# Patient Record
Sex: Female | Born: 1945 | Race: Black or African American | Hispanic: No | Marital: Married | Smoking: Former smoker
Health system: Southern US, Community
[De-identification: ages and names within clinical notes are randomized; demographics above are authoritative.]

## PROBLEM LIST (undated history)

## (undated) DIAGNOSIS — J45990 Exercise induced bronchospasm: Secondary | ICD-10-CM

## (undated) DIAGNOSIS — J189 Pneumonia, unspecified organism: Secondary | ICD-10-CM

## (undated) DIAGNOSIS — I2510 Atherosclerotic heart disease of native coronary artery without angina pectoris: Secondary | ICD-10-CM

## (undated) DIAGNOSIS — I82409 Acute embolism and thrombosis of unspecified deep veins of unspecified lower extremity: Secondary | ICD-10-CM

## (undated) DIAGNOSIS — K219 Gastro-esophageal reflux disease without esophagitis: Secondary | ICD-10-CM

## (undated) DIAGNOSIS — R51 Headache: Secondary | ICD-10-CM

## (undated) DIAGNOSIS — I219 Acute myocardial infarction, unspecified: Secondary | ICD-10-CM

## (undated) DIAGNOSIS — I5032 Chronic diastolic (congestive) heart failure: Secondary | ICD-10-CM

## (undated) DIAGNOSIS — E119 Type 2 diabetes mellitus without complications: Secondary | ICD-10-CM

## (undated) DIAGNOSIS — M1990 Unspecified osteoarthritis, unspecified site: Secondary | ICD-10-CM

## (undated) DIAGNOSIS — I639 Cerebral infarction, unspecified: Secondary | ICD-10-CM

## (undated) HISTORY — DX: Chronic diastolic (congestive) heart failure: I50.32

## (undated) HISTORY — PX: DILATION AND CURETTAGE OF UTERUS: SHX78

## (undated) HISTORY — PX: CATARACT EXTRACTION EXTRACAPSULAR: SHX1305

## (undated) HISTORY — PX: TUBAL LIGATION: SHX77

---

## 1997-04-23 ENCOUNTER — Ambulatory Visit (HOSPITAL_COMMUNITY): Admission: RE | Admit: 1997-04-23 | Discharge: 1997-04-23 | Payer: Self-pay | Admitting: Internal Medicine

## 1997-07-13 ENCOUNTER — Ambulatory Visit (HOSPITAL_COMMUNITY): Admission: RE | Admit: 1997-07-13 | Discharge: 1997-07-13 | Payer: Self-pay | Admitting: Internal Medicine

## 1997-11-02 ENCOUNTER — Ambulatory Visit (HOSPITAL_COMMUNITY): Admission: RE | Admit: 1997-11-02 | Discharge: 1997-11-02 | Payer: Self-pay | Admitting: Internal Medicine

## 1997-11-02 ENCOUNTER — Encounter: Payer: Self-pay | Admitting: Internal Medicine

## 1999-10-14 ENCOUNTER — Other Ambulatory Visit: Admission: RE | Admit: 1999-10-14 | Discharge: 1999-10-14 | Payer: Self-pay | Admitting: Internal Medicine

## 1999-10-16 ENCOUNTER — Encounter: Payer: Self-pay | Admitting: Internal Medicine

## 1999-10-16 ENCOUNTER — Encounter: Admission: RE | Admit: 1999-10-16 | Discharge: 1999-10-16 | Payer: Self-pay | Admitting: Internal Medicine

## 1999-12-24 ENCOUNTER — Encounter: Admission: RE | Admit: 1999-12-24 | Discharge: 1999-12-24 | Payer: Self-pay | Admitting: Orthopedic Surgery

## 1999-12-24 ENCOUNTER — Encounter: Payer: Self-pay | Admitting: Orthopedic Surgery

## 2000-02-24 ENCOUNTER — Encounter: Payer: Self-pay | Admitting: Internal Medicine

## 2000-02-25 ENCOUNTER — Inpatient Hospital Stay (HOSPITAL_COMMUNITY): Admission: AD | Admit: 2000-02-25 | Discharge: 2000-02-28 | Payer: Self-pay | Admitting: Internal Medicine

## 2000-07-28 ENCOUNTER — Emergency Department (HOSPITAL_COMMUNITY): Admission: EM | Admit: 2000-07-28 | Discharge: 2000-07-28 | Payer: Self-pay | Admitting: Plastic Surgery

## 2001-07-17 ENCOUNTER — Encounter: Payer: Self-pay | Admitting: Emergency Medicine

## 2001-07-17 ENCOUNTER — Inpatient Hospital Stay (HOSPITAL_COMMUNITY): Admission: EM | Admit: 2001-07-17 | Discharge: 2001-07-22 | Payer: Self-pay | Admitting: Emergency Medicine

## 2001-07-18 ENCOUNTER — Encounter (INDEPENDENT_AMBULATORY_CARE_PROVIDER_SITE_OTHER): Payer: Self-pay | Admitting: Cardiology

## 2001-07-19 ENCOUNTER — Encounter: Payer: Self-pay | Admitting: Internal Medicine

## 2001-07-21 ENCOUNTER — Encounter: Payer: Self-pay | Admitting: Internal Medicine

## 2001-07-21 ENCOUNTER — Encounter (INDEPENDENT_AMBULATORY_CARE_PROVIDER_SITE_OTHER): Payer: Self-pay | Admitting: Cardiovascular Disease

## 2001-07-22 ENCOUNTER — Inpatient Hospital Stay (HOSPITAL_COMMUNITY)
Admission: RE | Admit: 2001-07-22 | Discharge: 2001-08-10 | Payer: Self-pay | Admitting: Physical Medicine & Rehabilitation

## 2001-07-29 ENCOUNTER — Encounter: Payer: Self-pay | Admitting: Physical Medicine & Rehabilitation

## 2001-08-04 ENCOUNTER — Encounter: Payer: Self-pay | Admitting: Physical Medicine & Rehabilitation

## 2001-09-28 ENCOUNTER — Emergency Department (HOSPITAL_COMMUNITY): Admission: EM | Admit: 2001-09-28 | Discharge: 2001-09-28 | Payer: Self-pay

## 2001-10-18 ENCOUNTER — Encounter
Admission: RE | Admit: 2001-10-18 | Discharge: 2001-11-13 | Payer: Self-pay | Admitting: Physical Medicine & Rehabilitation

## 2001-11-14 ENCOUNTER — Encounter
Admission: RE | Admit: 2001-11-14 | Discharge: 2002-02-12 | Payer: Self-pay | Admitting: Physical Medicine & Rehabilitation

## 2002-02-13 ENCOUNTER — Encounter
Admission: RE | Admit: 2002-02-13 | Discharge: 2002-05-14 | Payer: Self-pay | Admitting: Physical Medicine & Rehabilitation

## 2002-05-04 ENCOUNTER — Encounter
Admission: RE | Admit: 2002-05-04 | Discharge: 2002-08-02 | Payer: Self-pay | Admitting: Physical Medicine & Rehabilitation

## 2002-05-15 ENCOUNTER — Encounter
Admission: RE | Admit: 2002-05-15 | Discharge: 2002-06-12 | Payer: Self-pay | Admitting: Physical Medicine & Rehabilitation

## 2002-08-29 ENCOUNTER — Encounter
Admission: RE | Admit: 2002-08-29 | Discharge: 2002-11-27 | Payer: Self-pay | Admitting: Physical Medicine & Rehabilitation

## 2002-09-05 ENCOUNTER — Encounter: Payer: Self-pay | Admitting: Ophthalmology

## 2002-09-05 ENCOUNTER — Encounter: Admission: RE | Admit: 2002-09-05 | Discharge: 2002-09-05 | Payer: Self-pay | Admitting: Ophthalmology

## 2002-09-06 ENCOUNTER — Ambulatory Visit (HOSPITAL_BASED_OUTPATIENT_CLINIC_OR_DEPARTMENT_OTHER): Admission: RE | Admit: 2002-09-06 | Discharge: 2002-09-06 | Payer: Self-pay | Admitting: Ophthalmology

## 2003-01-06 DIAGNOSIS — I639 Cerebral infarction, unspecified: Secondary | ICD-10-CM

## 2003-01-06 HISTORY — DX: Cerebral infarction, unspecified: I63.9

## 2003-05-29 ENCOUNTER — Encounter
Admission: RE | Admit: 2003-05-29 | Discharge: 2003-08-27 | Payer: Self-pay | Admitting: Physical Medicine & Rehabilitation

## 2005-10-28 ENCOUNTER — Inpatient Hospital Stay (HOSPITAL_COMMUNITY): Admission: EM | Admit: 2005-10-28 | Discharge: 2005-11-15 | Payer: Self-pay | Admitting: Emergency Medicine

## 2005-11-09 ENCOUNTER — Encounter: Payer: Self-pay | Admitting: Vascular Surgery

## 2005-11-16 ENCOUNTER — Ambulatory Visit (HOSPITAL_COMMUNITY): Admission: RE | Admit: 2005-11-16 | Discharge: 2005-11-16 | Payer: Self-pay | Admitting: Internal Medicine

## 2005-11-23 ENCOUNTER — Ambulatory Visit (HOSPITAL_COMMUNITY): Admission: RE | Admit: 2005-11-23 | Discharge: 2005-11-23 | Payer: Self-pay | Admitting: Internal Medicine

## 2005-11-30 ENCOUNTER — Ambulatory Visit (HOSPITAL_COMMUNITY): Admission: RE | Admit: 2005-11-30 | Discharge: 2005-11-30 | Payer: Self-pay

## 2006-03-22 ENCOUNTER — Emergency Department (HOSPITAL_COMMUNITY): Admission: EM | Admit: 2006-03-22 | Discharge: 2006-03-22 | Payer: Self-pay | Admitting: Emergency Medicine

## 2007-03-01 ENCOUNTER — Emergency Department (HOSPITAL_COMMUNITY): Admission: EM | Admit: 2007-03-01 | Discharge: 2007-03-01 | Payer: Self-pay | Admitting: Emergency Medicine

## 2007-11-22 ENCOUNTER — Ambulatory Visit (HOSPITAL_COMMUNITY): Admission: RE | Admit: 2007-11-22 | Discharge: 2007-11-22 | Payer: Self-pay | Admitting: Ophthalmology

## 2007-12-05 ENCOUNTER — Ambulatory Visit (HOSPITAL_COMMUNITY): Admission: RE | Admit: 2007-12-05 | Discharge: 2007-12-05 | Payer: Self-pay | Admitting: Ophthalmology

## 2008-04-03 ENCOUNTER — Emergency Department (HOSPITAL_COMMUNITY): Admission: EM | Admit: 2008-04-03 | Discharge: 2008-04-03 | Payer: Self-pay | Admitting: Emergency Medicine

## 2008-04-06 ENCOUNTER — Emergency Department (HOSPITAL_COMMUNITY): Admission: EM | Admit: 2008-04-06 | Discharge: 2008-04-07 | Payer: Self-pay | Admitting: Emergency Medicine

## 2008-06-17 ENCOUNTER — Observation Stay (HOSPITAL_COMMUNITY): Admission: EM | Admit: 2008-06-17 | Discharge: 2008-06-17 | Payer: Self-pay | Admitting: Emergency Medicine

## 2009-06-18 ENCOUNTER — Encounter: Admission: RE | Admit: 2009-06-18 | Discharge: 2009-06-18 | Payer: Self-pay | Admitting: Internal Medicine

## 2010-04-14 LAB — COMPREHENSIVE METABOLIC PANEL
ALT: 9 U/L (ref 0–35)
Alkaline Phosphatase: 90 U/L (ref 39–117)
BUN: 13 mg/dL (ref 6–23)
CO2: 21 mEq/L (ref 19–32)
Chloride: 110 mEq/L (ref 96–112)
GFR calc non Af Amer: 40 mL/min — ABNORMAL LOW (ref >60)
Glucose, Bld: 90 mg/dL (ref 70–99)
Potassium: 3.4 mEq/L — ABNORMAL LOW (ref 3.5–5.1)
Sodium: 139 mEq/L (ref 135–145)
Total Bilirubin: 0.4 mg/dL (ref 0.3–1.2)

## 2010-04-14 LAB — DIFFERENTIAL
Basophils Absolute: 0 10*3/uL (ref 0.0–0.1)
Basophils Relative: 0 % (ref 0–1)
Eosinophils Absolute: 0 10*3/uL (ref 0.0–0.7)
Eosinophils Relative: 0 % (ref 0–5)
Lymphocytes Relative: 21 % (ref 12–46)
Lymphs Abs: 1.7 K/uL (ref 0.7–4.0)
Monocytes Absolute: 0.5 K/uL (ref 0.1–1.0)
Monocytes Relative: 6 % (ref 3–12)
Neutro Abs: 5.6 10*3/uL (ref 1.7–7.7)
Neutrophils Relative %: 72 % (ref 43–77)

## 2010-04-14 LAB — URINALYSIS, ROUTINE W REFLEX MICROSCOPIC
Bilirubin Urine: NEGATIVE
Glucose, UA: NEGATIVE mg/dL
Hgb urine dipstick: NEGATIVE
Ketones, ur: 15 mg/dL — AB
Leukocytes, UA: NEGATIVE
Nitrite: NEGATIVE
Protein, ur: 30 mg/dL — AB
Specific Gravity, Urine: 1.022 (ref 1.005–1.030)
Urobilinogen, UA: 0.2 mg/dL (ref 0.0–1.0)
pH: 5.5 (ref 5.0–8.0)

## 2010-04-14 LAB — POCT CARDIAC MARKERS
CKMB, poc: <1 ng/mL — ABNORMAL LOW (ref 1.0–8.0)
Myoglobin, poc: 74.4 ng/mL (ref 12–200)
Troponin i, poc: <0.05 ng/mL (ref 0.00–0.09)

## 2010-04-14 LAB — CBC
HCT: 33.7 % — ABNORMAL LOW (ref 36.0–46.0)
Hemoglobin: 11 g/dL — ABNORMAL LOW (ref 12.0–15.0)
MCHC: 32.7 g/dL (ref 30.0–36.0)
MCV: 89 fL (ref 78.0–100.0)
Platelets: 462 K/uL — ABNORMAL HIGH (ref 150–400)
RBC: 3.78 MIL/uL — ABNORMAL LOW (ref 3.87–5.11)
RDW: 17.1 % — ABNORMAL HIGH (ref 11.5–15.5)
WBC: 7.8 K/uL (ref 4.0–10.5)

## 2010-04-14 LAB — COMPREHENSIVE METABOLIC PANEL WITH GFR
AST: 16 U/L (ref 0–37)
Albumin: 3.2 g/dL — ABNORMAL LOW (ref 3.5–5.2)
Calcium: 7.5 mg/dL — ABNORMAL LOW (ref 8.4–10.5)
Creatinine, Ser: 1.34 mg/dL — ABNORMAL HIGH (ref 0.4–1.2)
GFR calc Af Amer: 48 mL/min — ABNORMAL LOW (ref >60)
Total Protein: 6.9 g/dL (ref 6.0–8.3)

## 2010-04-14 LAB — GLUCOSE, CAPILLARY
Glucose-Capillary: 89 mg/dL (ref 70–99)
Glucose-Capillary: 97 mg/dL (ref 70–99)

## 2010-04-14 LAB — LIPASE, BLOOD: Lipase: 17 U/L (ref 11–59)

## 2010-04-14 LAB — URINE MICROSCOPIC-ADD ON

## 2010-04-16 LAB — CBC
HCT: 32.9 % — ABNORMAL LOW (ref 36.0–46.0)
Platelets: 344 10*3/uL (ref 150–400)
RBC: 3.73 MIL/uL — ABNORMAL LOW (ref 3.87–5.11)
WBC: 5.4 10*3/uL (ref 4.0–10.5)

## 2010-04-16 LAB — BASIC METABOLIC PANEL
BUN: 18 mg/dL (ref 6–23)
Creatinine, Ser: 1.35 mg/dL — ABNORMAL HIGH (ref 0.4–1.2)
GFR calc Af Amer: 48 mL/min — ABNORMAL LOW (ref >60)
GFR calc non Af Amer: 40 mL/min — ABNORMAL LOW (ref >60)
Potassium: 3.8 mEq/L (ref 3.5–5.1)

## 2010-04-16 LAB — DIFFERENTIAL
Basophils Relative: 1 % (ref 0–1)
Eosinophils Absolute: 0.1 10*3/uL (ref 0.0–0.7)
Neutrophils Relative %: 61 % (ref 43–77)

## 2010-04-16 LAB — CK TOTAL AND CKMB (NOT AT ARMC)
CK, MB: 0.9 ng/mL (ref 0.3–4.0)
Relative Index: INVALID (ref 0.0–2.5)

## 2010-04-16 LAB — TROPONIN I: Troponin I: <0.01 ng/mL (ref 0.00–0.06)

## 2010-04-17 LAB — DIFFERENTIAL
Basophils Absolute: 0.1 10*3/uL (ref 0.0–0.1)
Eosinophils Relative: 1 % (ref 0–5)
Lymphocytes Relative: 33 % (ref 12–46)
Monocytes Absolute: 0.5 10*3/uL (ref 0.1–1.0)

## 2010-04-17 LAB — COMPREHENSIVE METABOLIC PANEL
AST: 18 U/L (ref 0–37)
Albumin: 3.6 g/dL (ref 3.5–5.2)
Alkaline Phosphatase: 66 U/L (ref 39–117)
Chloride: 111 mEq/L (ref 96–112)
Creatinine, Ser: 1.4 mg/dL — ABNORMAL HIGH (ref 0.4–1.2)
GFR calc Af Amer: 46 mL/min — ABNORMAL LOW (ref >60)
Potassium: 3.9 mEq/L (ref 3.5–5.1)
Sodium: 141 mEq/L (ref 135–145)
Total Bilirubin: 0.4 mg/dL (ref 0.3–1.2)

## 2010-04-17 LAB — POCT CARDIAC MARKERS: Troponin i, poc: <0.05 ng/mL (ref 0.00–0.09)

## 2010-04-17 LAB — URINALYSIS, ROUTINE W REFLEX MICROSCOPIC
Leukocytes, UA: NEGATIVE
Nitrite: NEGATIVE
Specific Gravity, Urine: 1.027 (ref 1.005–1.030)
pH: 5.5 (ref 5.0–8.0)

## 2010-04-17 LAB — POCT I-STAT, CHEM 8
BUN: 22 mg/dL (ref 6–23)
Calcium, Ion: 1.1 mmol/L — ABNORMAL LOW (ref 1.12–1.32)
Chloride: 112 mEq/L (ref 96–112)
Creatinine, Ser: 1.4 mg/dL — ABNORMAL HIGH (ref 0.4–1.2)
Glucose, Bld: 128 mg/dL — ABNORMAL HIGH (ref 70–99)

## 2010-04-17 LAB — URINE MICROSCOPIC-ADD ON

## 2010-04-17 LAB — GLUCOSE, CAPILLARY

## 2010-04-17 LAB — APTT: aPTT: 38 seconds — ABNORMAL HIGH (ref 24–37)

## 2010-04-17 LAB — CBC
Platelets: 362 10*3/uL (ref 150–400)
WBC: 10.4 10*3/uL (ref 4.0–10.5)

## 2010-05-20 NOTE — Op Note (Signed)
NAME:  [name] [name]                   ACCOUNT NO.:  [id number]   MEDICAL RECORD NO.:  [id number]          PATIENT TYPE:  AMB   LOCATION:  SDS                          FACILITY:  [facility]   PHYSICIAN:  Jillyn Hidden A. Rankin, M.D.   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   PROCEDURE:  1. Posterior vitrectomy - 25-gauge, left eye.  2. Exchange of intraocular lens with extraction of the lens from the      bag and placement of a sulcus-supported lens.  3. 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   SURGEON:  Alford Highland. Rankin, MD   ANESTHESIA:  Local retrobulbar, monitored anesthesia control.   INDICATIONS FOR PROCEDURE:  The patient is a 65 year old woman with  profound vision loss in her left eye on the basis of media opacity  coming from apparent substrate of the intraocular lens developing  cloudiness, crystallization, and dense opacification which limits  visualization posteriorly.  The patient understands this is an attempt  to improve the ocular media opacity by removal of the intraocular lens  and placement of a secondary intraocular lens.  She understands the risk  of anesthesia including rare occurrence of death, loss of the eye  including but not limited to surgical complications including vitreous  loss, loss of capsular support, need for anterior chamber intraocular  lens placement, decentration of the lens.  She understands the risk of  anesthesia as well.  She understands these risks and wishes to  proceed.   PROCEDURE IN DETAIL:  Appropriate signed consent was obtained.  The  patient was taken to the operating room.  In the operating room,  appropriate monitors followed by mild sedation.  Xylocaine 2% injected 5  mL retrobulbar with additional 5 mL laterally in fashion of modified Darel Hong.  The left periocular region was then sterilely prepped and draped  in the usual sterile fashion.  Lid speculum was applied.  A 25-gauge  trocar for the infusion placed inferotemporally and the infusion turned  on.  Superior trocars placed as well in each quadrant.  Conjunctival  peritomy was then fashioned in a limited fashion superiorly.  A groove  level incision was then fashioned.  The anterior chamber was not opened  at this time.  The anterior chamber was subsequently opened with an MVR  blade and deepened with Viscoat.  Attempt at vitrectomy confirmed that  the media opacification was in the intraocular lens because the tips  could not be seen and there was a diffuse opacity and diffraction of the  view as well as of the intraocular light source.  At this time, plugs were placed on the 25-gauge ports.  Thereafter,  across the anterior chamber deepened with Viscoat, surgical openings  with a curved 20-gauge DORC scissors were then made to make an anterior  capsulotomy since there was a tight capsular fitting.  This allowed for  stretching of the anterior capsular rim.  This allowed for mobilization  of the intraocular lens.  Sinskey hook was then used to mobilize the  edge of the optic and then subsequently the haptic nasally and this was  allowed to rotate gently and into the iris plane.  Thereafter, a second  paracentesis site was then made so as to allow placement of a Kuglen  hook.  This allowed for bimanual rotation of the lens out of the  capsular bag and into the anterior chamber.  The cornea was protected by  Viscoat.  At this time, the limbal incision was then enlarged with an   MVR blade.  The intraocular lens was externalized.  At this time, also  noted that the iris was extremely floppy.  The posterior pressure with  an infusion cannula had been turned off well before any surgical  manipulations anteriorly.  Nonetheless, the floppy iris prolapsed from  the wound.  This required a small keyhole iridectomy to be performed so  as to complete the procedure.  Thereafter, the Alcon model [id number] lens  was then placed into the sulcus after the sulcus had been deepened with  Viscoat.  The power was +17.0.   The lens was rotated in the horizontal position with excellent  centration characteristics.  It must be noted that posterior vitrectomy  was done on repeated occasions to keep the pressure on the iris-lens  diaphragm low and to prevent and minimize posterior pressure proceeding  anteriorly.  At this time, the limbal wound was then closed with a 10-0  nylon in interrupted fashion.  Small amounts of the Viscoat were  irrigated prior to completion of the closure with BSS.  Excellent  centration of the lens was confirmed.  At this time, the remaining  corneal limbal wound was closed with 10-0 nylon sutures.  Attention was  then drawn to vitrectomy.  A notable finding was that now the optic  nerve and macula could be clearly seen.  The vitreous had not  spontaneously detached.  Core vitrectomy was then completed.  No attempt  was made to release the posterior hyaloid because there appeared to be  some sort of flat nonetheless dense condensation of the vitreous face  overlying the optic nerve and I decided not to do anything to disturb  the microvasculature of an otherwise normal, healthy optic nerve  appearance.  Macula also appeared normal.  The vasculature appeared  normal.  Peripheral retina was inspected and was found to be free of  retinal holes or tears.  At this time, the surgical instruments were  removed from the eye.  Trocars were removed.  The wounds were  secure.  The infusion was removed.  The conjunctiva was irrigated copiously.  Conjunctiva was then brought forward and closed with 7-0 Vicryl sutures.  The wounds were secure.  Subconjunctival Decadron applied.  Sterile  patch and Fox shield applied.  The patient tolerated the procedure  without complication.      Alford Highland Rankin, M.D.  Electronically Signed     GAR/MEDQ  D:  12/05/2007  T:  12/06/2007  Job:  [id number]
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
## 2010-05-23 NOTE — H&P (Signed)
[facility]  Patient:    [name], [name]                          MRN: [id number] Adm. Date:  [id number] Attending:  Gwenyth Bender                         History and Physical  CHIEF COMPLAINT:  Progressive weakness, nausea and vomiting, with presyncope.  HISTORY OF PRESENT ILLNESS:  First recent [facility] admission for this 65 year old married black female who presented to the office complaining of a five-day history of increasing weakness, cough, fever, and chills.  She had been exposed to several family members who had "the flu."  She noted increasing sinus congestion with headache with subsequent postnasal drainage and cough.  She was given Zithromax for this.  She, however, continued to have progressive problems with cough and increasing weakness as well.  Four days prior to admission she had a syncopal episode at home which was unwitnessed. She was found unconscious.  She, however, was not taken to the emergency room for evaluation.  Since that time, she has not felt well.  Appetite has been poor.  She has been unable to drink liquids.  She notes she did have an episode of transient diarrhea which was mostly soft stool.  There has been no hematemesis, melena, or hematochezia.  The day of admission she developed a severe explosive headache.  This was associated with unsteadiness to her gait. Had ongoing nausea and vomiting.  Patient was seen in the office and was subsequently transferred for admission.  PAST MEDICAL HISTORY:  Remarkable for recurring problems with sinusitis and allergic rhinitis in the past.  She has had intermittent headaches for several years.  Presently, she notes that her headaches are on a daily basis.  They tend to be throbbing in nature without associated nausea.  Denies any precipitation of headaches with stress.  Patient has had sporadic office visits.  She has not had comprehensive evaluation in an extended period  of time.  She has longstanding history of depression as well.  REVIEW OF SYSTEMS:  Notable for recent problems with constipation.  She notes that she remains chronically constipated.  Last "good bowel movement" has been approximately two weeks ago.  She denies dysuria.  Denies significant leg pains.  Weakness as noted above.  HABITS:  Patient smokes approximately two packs of cigarettes a day for the past 26 years.  Does not drink.  PREVIOUS SURGERIES:  Tonsillectomy and tubal ligation.  ALLERGIES:  PENICILLIN.  PRESENT MEDICATIONS: 1. Allegra 180 mg p.o. q.d. 2. Premarin 0.3 mg q.d. 3. Motrin 800 mg t.i.d. 4. Norvasc 5 mg p.o. q.d.  PHYSICAL EXAMINATION:  GENERAL:  She is an ill-appearing black female.  VITAL SIGNS:  Height 5 feet 5 inches, weight 151 pounds.  Blood pressure 154/85, pulse 80, respiratory rate 20, temperature 98.3 (99.9 in the office).  HEENT:  Head normocephalic, atraumatic without bruits.  Extraocular muscles are intact.  Mild dizziness on the right lateral gaze.  She has tenderness in the frontal and left maxillary sinus region.  Nose shows bilateral turbinate edema without occlusions.  TMs notable with decreased light reflex without erythematous changes.  Throat shows membranes are dry.  NECK:  Supple.  No posterior cervical nodes.  LUNGS:  Coarse breath sounds bilaterally without wheezes or rales appreciated.  CARDIOVASCULAR:  Normal S1, S2,  no S3 or S4.  She has a 1/6 systolic ejection murmur in her left lower sternal border.  No rub appreciated.  ABDOMEN:  Bowel sounds are present.  Abdomen is distended.  She has dullness diffusely.  No rebound tenderness.  MUSCULOSKELETAL:  No tenderness in AC joints bilaterally.  Full passive range of motion.  She has mild crepitus in the knees bilaterally.  Negative Homans, no edema.  NEUROLOGIC:  She is slightly lethargic but oriented to person, place, and time.  Complained of extreme weakness.  Grasp was  symmetric and negative drift.  Absent Babinskis bilaterally.  LABORATORY DATA:  CBC revealed WBC 5500, hemoglobin 11.9, hematocrit 35.9. Platelets 316,000.  Chemistry revealed sodium 140, potassium 3.5, chloride 106, CO2 26, BUN 8, creatinine 0.9, glucose 121.  SGOT 19, SGPT 11.  Alkaline phosphatase 72.  Total protein 6.9, albumin 3.4.  Calcium 8.9.  Urinalysis dipstick negative, pH 6.5, specific gravity 1.007.  Abdominal film showed evidence for colon being full of feces.  No other obstruction noted.  IMPRESSION:  1. Recurrent nausea and vomiting.  Rule out secondary to post viral     syndrome, i.e. influenza versus colonic dysfunction versus other.  2. Dehydration associated with nausea and vomiting.  3. Chronic colonic dysfunction with recent exacerbation.  4. Viral syndrome.  5. Rule out occult sinusitis, partially treated.  She had been given a     course of antibiotics.  6. Chronic history of headaches.  Rule out mixed vascular headaches versus     migraines.  7. Depression.  8. Anxiety disorder, chronic.  9. Tobacco abuse. 10. Incomplete medical base.  Patient has not returned for regular     comprehensive exams.  PLAN:  She is admitted for initial 23-hour observation.  Will place on IV fluids for rehydration.  Place her on Tamiflu for influenza-like symptoms. Control pain as necessary.  Will need CT scan of the head as well.  Reevaluate after hydration.  Will need to plan on catharsis as well.  Further therapy pending response to the above. DD:  02/25/00 TD:  02/26/00 Job: [id number]

## 2010-05-23 NOTE — Discharge Summary (Signed)
NAME:  [name], [name]                             ACCOUNT NO.:  [id number]   MEDICAL RECORD NO.:  [id number]                   PATIENT TYPE:  IPS   LOCATION:  4033                                 FACILITY:  [facility]   PHYSICIAN:  Ranelle Oyster, M.D.             DATE OF BIRTH:  07-16-1945   DATE OF ADMISSION:  07/22/2001  DATE OF DISCHARGE:  08/10/2001                                 DISCHARGE SUMMARY   DISCHARGE DIAGNOSES:  1. Mid-brain cerebrovascular accident.  2. History of hypertension.  3. History of cholesterolemia.  4. Anorexia.  5. History of diabetes mellitus.   HISTORY OF PRESENT ILLNESS:  Patient is a 65 year old black female with past  history of tobacco abuse and hypertension admitted on July 17, 2001, at  [facility] ER after found unresponsive at home by family.  Positive for  headache.  MRI revealed arterial infarction located in the mid-brain.  Dopplers revealed no evidence of internal carotid artery stenosis.  Patient  it presently on aspirin for CVA prophylaxis.  PT at this time indicates  patient is ambulating with mild assist 12 feet and transfers to sit down  without assist.   HOSPITAL COURSE:  Significant for a UTI and constipation.  Patient was  transferred to the [facility] on July 22, 2001.   PAST MEDICAL HISTORY:  Significant for hypertension, diabetes mellitus,  tobacco abuse, and depression.   PAST SURGICAL HISTORY:  Significant for cardiac surgery and tubal ligation.   MEDS PRIOR TO ADMISSION:  Actos 200 mg q.d., Accupril 20 mg q.d., Elavil 10  mg q.d.   ALLERGIES:  Denied.   SOCIAL HISTORY:  Patient lives alone in a one-level home with family in  [place].  One step to enter.  She smokes 1 pack of cigarettes a day.  Prior to admission, she denies any alcohol.  She has 4 children.  She works  third shift.  Husband is retired.   REVIEW OF SYSTEMS:  Positive for headaches and dizziness.  Primary care  Karlina Suares is Dr.  August Saucer.   HOSPITAL COURSE:  [name] was admitted to the [facility]  Department on July 22, 2001, for complications and patient rehabilitation,  where she received more than 3 hours of PT and OT therapy daily.  Her  hospital course was significant for the following:   1. Mid-brain CVA:  [name] made very slow progress while in rehab and at     the time of discharge did not meet goals.  During most of her stay, the     patient was occasionally lethargic, and a flat affect, and refused     medications at times.  It was noted that she participated better with     therapy with her family, and therefore she received an early discharge.     She remained on aspirin 325 mg p.o. q.d.  for CVA prophylaxis.  Patient     was started on a psychostimulant, Ritalin, as well as bromocriptine.     Doses were adjusted accordingly.  Bromocriptine was dc 'd on August 09, 2001.  The patient did develop some mild tachycardia, therefore, Ritalin     dose was decreased from 15 mg to 10 mg at 7 a.m. and noon.  On August 04, 2001, patient was noted to be less responsive and decreased alertness.     Therefore, a head CT was ordered on August 04, 2001.  Head CT revealed     probable bilateral small thalamic, likely neural infarct, question     infarct versus artifact in brain.  Due to tachycardia noted on August 09, 2001, the patient had EKG, which revealed sinus tachycardia.  The patient     remained on asymptomatic and heart rate did improve.  Patient had no more     neurological changes.  The patient was started on Paxil 20 mg p.o. q.d.     on July 29, 2001.  This was later discontinued on August 02, 2001.   1. History of hypercholesterolemia:  She remained on Zocor 40 mg p.o. q.     h.s.  During the time spent in rehab, liver function was within normal     limits.  F/E/N:  The patient had a poor appetite, mainly during rehab.     At time of admission, she was on a __________ 1 diet with  nectar-thick     liquids.  She tolerates speech very well.  Patient had a repeat swallow     study performed on August 09, 2001, which upgraded her diet to __________     1 and thin liquids.  The patient was encouraged to increase p.o. intake.     She was started on Remeron on 15 mg q.h.s. on July 24, 2001.   1. F/ENN:  Patient's pre-albumin level was 24.1.  Patient n.p.o. intake did     improve slightly.   1. Constipation:  On first week of rehab, patient was complaining about     constipation, unable to perform a bowel movement.  KUB was ordered on     July 29, 2001, to rule-out obstruction.  KUB came out negative.  Patient     received sorbitol and enema for constipation.  Constipation did resolve.   1. UTI:  Patient received a 7-day course of Tequin for urinary tract     infection.   1. History of diabetes mellitus:  CBGs remained in good control without     medication.  With her decreased p.o. intake, she remained on an 1800 ADA     __________ 1 diet.  CBGs were checked twice daily rotating.   1. History of hypertension:  Blood pressure remained on great control while     in rehab.  She was presently on no antihypertensives.   LABORATORY:  Latest labs indicate that her potassium was 4.0, sodium 100, glucose 111,  AST was 20, ALT was 17, alkaline phosphatase was 72, BUN was 17, creatinine  was 0.9.  White blood cell count was 6.1.  Hemoglobin 14.4.  Hematocrit  44.4.  Platelet count 338.   At time of discharge, vitals were stable.  Blood pressure was 122/60.  Respiratory rate was 26.  Heart rate was 126.  Temperature was 95.3.  CBGs  were running from 126 to 124 to 109  and 93.  PT report indicated patient was  ambulating 30 feet with moderate to maximum assist, secondary to loss of  balance and unable to correct.  She was moving a wheelchair over 25 feet  with minimum assistance.  Her transfer to sit and stand minimum assistance. Bed mobility with supervision.  Performance of  ADLs minimum assist,  supervision level.  PT reported the patient was very lethargic throughout  CIR stay.  Performs better when family is present.  Length of stay was moved  up secondary to lethargy and decreased participation.  She had an extensive  family education completed primarily with patient's spouse, who performed 24-  hour supervision.  He demonstrated independent assistance with all of  patient's ADLs.  She continued to have bowel __________  and trunk ataxia,  and she also has visual-perceptual defects with left eye 90% ptosis.  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  Also still with more like a severe  dysarthria, which makes it difficult to assess language issues.  She appears  to have an element of aphasia, receptive and expressive.  Patient did not  meet all OT goals secondary to several things, including lethargy and  decreased participation.  Patient's spouse will provide 24-hour assistance  with ADLs at home.  Patient was discharged home with her husband.   DISCHARGE MEDICATIONS:  Aspirin 325 mg daily, Zocor 40 mg in afternoon,  Remeron 15 mg at night, Ritalin 5 mg while taper, multivitamin and Centrum  Silver daily.  Take all meds on the sheet.  Take Actos, Elavil, and Accupril  until follow up with Dr. August Saucer.  No drinking, no driving, no smoking.  Use a  walker.  Use a wheelchair.  Eat 100% of meals.  Soft diet, thin liquids.  Check sugars at least 2 times a week, and record results and times.  She  will have to taper home health for speech OT and PT.  She will follow up  with Dr. Faith Rogue October 1 at 10 o'clock, follow up with Dr. Lesia Sago within 4 weeks.  Call for appointment.  Follow up with Dr. August Saucer in 6  weeks, and monitor sugars and blood pressures.     Treasa Bradshaw Dictator                          Ranelle Oyster, M.D.    DD/MEDQ  D:  08/10/2001  T:  08/15/2001  Job:   [id number]

## 2010-05-23 NOTE — Consult Note (Signed)
[place]  Patient:    [name], [name] Visit Number: [id number] MRN: [id number]          Service Type: [facility] Location: [phone] Attending Physician:  Faith Rogue T Dictated by:   Annie Main, N.P. Proc. Date: 07/22/01 Admit Date:  07/22/2001                            Consultation Report  HISTORY OF PRESENT ILLNESS AND HOSPITAL COURSE:  The patient is a 65 year old right-handed black female with a past medical history significant for hypertension, type 2 diabetes, depression and tobacco abuse who the family found unresponsive in the bed.  According to her husband, she complained of headache and tingling of the lips when she went to bed the night before.  At admission the patient was obtunded and would not open her eyes or respond to commands.  She would not speak.  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  MRA did not show any occlusive disease.  Did show somewhat narrowing of left A1 at origin in both ACAs.  Carotid Dopplers were normal, 2-D echo showed no cardiac source and TEE also was unrevealing for cardiac source.  Stroke was felt to be embolic in nature but no source found.  IV heparin was discontinued and the patient was started on aspirin therapy for stroke prevention.  She was transferred to rehab at discharge for continued therapies.  CONDITION AT DISCHARGE:  The patient alert.  Moves all extremities x4.  Has bilateral ataxia with extremities and finger-nose-finger.  Extraocular movements were intact to the right eye only with left eye gaze paresis. Short-term memory was about 40 seconds.  Could not remember three objects after that time frame.  Did have a UTI and was started on Tequin for that.  FOLLOWUP:  Followup planned in office 1 month after discharge from rehab. Dictated by:   Annie Main,  N.P. Attending Physician:  Faith Rogue T DD:  07/22/01 TD:  07/26/01 Job: [id number]
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
## 2010-05-23 NOTE — Discharge Summary (Signed)
[place]  Patient:    [name], [name]                          MRN: [id number] Adm. Date:  [id number] Disc. Date: [id number] Attending:  Gwenyth Bender                           Discharge Summary  FINAL DIAGNOSES: 1. Mild infection, unspecified (079.99) 2. Hypovolemia (276.5). 3. 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 4. Depressive disorder (311.0). 5. Headache (784.0) 6. 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 Tobacco use disorder (305.1)  PROCEDURES:  None.  HISTORY OF PRESENT ILLNESS:  This is the first recent [facility] admission for this 65 year old married black female who presented to the office complaining of a 5 day history of increasing weakness, cough, fever, and chills.  The patient had been exposed to several family members who had "the flu".  She noted an increase in sinus congestion with headache, with subsequent post nasal drainage and cough.  The patient was given Zithromax, however, she continued to have progressive problems with cough and increasing weakness.  Four days prior to admission she had a syncopal episode at home which was unwitnessed.  She was found unconscious.  She, however, was not taken to the emergency room for evaluation.  Since that time she has not felt well.   her appetite has been poor.  The patient had been unable to drink liquids or solids.  She has had 1 brief episode of diarrhea.  No hematemesis, melena or hematochezia.  On the day of admission she developed a severe explosive headache.  The patient was subsequently admitted to the hospital after presenting to the office in acute distress.  PAST MEDICAL HISTORY AND PHYSICAL EXAMINATION:  Per admission history and physical.  HOSPITAL COURSE:  The patient was admitted for further evaluation and treatment of recurring nausea and vomiting with dehydration and colonic dysfunction.  It was felt that she did indeed have a viral syndrome ______ flu by  history.  As she was dehydrated she was placed on IV fluids with gradual rehydration of a subsequent 23 hour period.  The patient was also started on Tamiflu for her influenza like symptoms. After 24 hours the patient felt somewhat better.  She continued however, to have episodic nausea and vomiting.  On abdominal examination she was noted to have marked distention with dullness in all quadrants.  X-rays of her abdomen were obtained which demonstrated evidence for feces throughout the colon with no obstruction.  She subsequently underwent significant catharsis.  She was continued on IV fluids for hydration.  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  The next day the patient felt considerably better.  Her headaches were decreasing.  After catharsis she had no further nausea and vomiting.  Appetite gradually returned.  She was continued on hydration as there was still some evidence of mild volume depletion.  Eventually by February 27, 2000, she was feeling much better.  Her headaches had resolved completely.  She was sleeping much better.  Appetite was improved.  It was noted during her hospital stay that she had mild hyperglycemia.  A hemoglobin A1C was obtained which was at 6.8.  The patient was counseled regarding avoidance of ______ fruits and sweets in the diet.  She does have a family  history of diabetes.  By March 27, 2000, she was feeling considerably better and felt to be safe for discharge.  DISCHARGE MEDICATIONS: 1. Claritin 10 mg p.o. q.d. 2. Paxil 20 mg q.d. 3. Tranxene 7.5 mg t.i.d. p.r.n. 4. Elavil 10 mg q.h.s. 5. Colace 100 mg t.i.d. 6. Miralax 17 grams in ounces of water q.d. for constipation.  DIET:  No concentrated sweets in her diet.  Plenty of fruits and vegetables have been encouraged.  FOLLOWUP:  She is to call our office for an appointment in 2 weeks  time. DD:  04/07/00 TD:  04/08/00 Job: [id number]

## 2010-05-23 NOTE — Op Note (Signed)
NAME:  [name], [name]                             ACCOUNT NO.:  [id number]   MEDICAL RECORD NO.:  [id number]                   PATIENT TYPE:  AMB   LOCATION:  DSC                                  FACILITY:  [facility]   PHYSICIAN:  Michael A. Karleen Hampshire, M.D.            DATE OF BIRTH:  December 28, 1945   DATE OF PROCEDURE:  09/06/2002  DATE OF DISCHARGE:                                 OPERATIVE REPORT   PREOPERATIVE DIAGNOSES:  1. Exotropia with diplopia.  2. 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   POSTOPERATIVE DIAGNOSES:  1. Exotropia with diplopia.  2. 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   SURGEON:  Tyrone Apple. Karleen Hampshire, M.D.   ANESTHESIA:  General with endotracheal intubation.   PROCEDURES:  Left medial rectus resection of 5 mm, a left lateral rectus  recession of 8 mm, and a right lateral rectus recession of 5 mm on  adjustable sutures.   INDICATION FOR PROCEDURE:  [name] is a 65 year old black female who is  status post a stroke resulting in a left third nerve paresis with a left  hemiplegia and diplopia.  This procedure is indicated to restore single  binocular vision and restore alignment of the visual axis.  The risks and  benefits of the procedure were explained to the patient and the patient's  family prior to the procedure.  Informed consent was obtained.   TECHNIQUE:  The patient was taken to the operating room and placed in the  supine position.  The entire face was prepped and draped in the usual  sterile manner.  Our attention was first turned to the left eye.  The bulb  was held in the inferior nasal quadrant and the eye was elevated and  abducted.  The incision was made through the inferior nasal fornix, taken  down to the posterior sub-Tenon's space, and the left medial rectus muscle  was then isolated on a Stevens hook, subsequently on a Green hook.  A second  Green hook was then passed near the tendon of the muscle and the  muscle was  then carefully dissected free from its overlying muscle fascia and  intramuscular septum.  The tendon was then placed upon a second Green hook  and a mark was then placed on the tendon at 5 mm from its insertion.  The  tendon was then imbricated at the preplaced mark using 6-0 Vicryl suture and  taking two locking bites of the ends of the muscle.  The muscle was then  transected proximal to the preplaced sutures and the transected muscle was  advanced through the insertion site using the preplaced sutures and  reattached to the globe.  The conjunctiva was then repositioned.  Our  attention was then turned to the right eye.  Forced duction tests were  performed and found to be negative.  The globe was held  in the inferior  temporal quadrant.  It was elevated and adducted.  An incision was made  through the inferior temporal fornix.  It was taken down to the posterior  sub-Tenon's space and the right lateral rectus was isolated on a Stevens  hook, subsequently on a Green hook.  A second Green hook was passed beneath  the tendon, and the tendon was then carefully dissected free from its  overlying muscle fascia and intramuscular septum.  It was then imbricated on  a 6-0 Vicryl suture, taking two locking bites at the ends, detached from the  globe, and recessed exactly 5 mm, reattached to the globe using the  preplaced sutures in adjustable suture fashion.  The adjustable sutures were  then deposited underneath the conjunctiva for later adjustment.  Our  attention was then turned to the left lateral rectus.  The globe was held in  the inferior temporal quadrant of the left eye and the eye was then elevated  and adducted.  An incision was made from the inferior temporal fornix of the  left eye and taken down to the posterior sub-Tenon's space and the left  lateral rectus muscle was then isolated on a Stevens hook, subsequently on a  Green hook.  An identical recession of the left  lateral rectus muscle was  performed of 8 mm using the technique outlined above for the right lateral  rectus recession.  At the conclusion of the procedure antibiotic ointment  was instilled in the inferior fornices of the left eye.  A double pressure  patch was applied to the right eye, and the patient was subsequently  adjusted to orthophoria in the minor surgery room.  There were no  complications.                                                 Casimiro Needle A. Karleen Hampshire, M.D.    MAS/MEDQ  D:  09/06/2002  T:  09/07/2002  Job:  [id number]
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
## 2010-05-23 NOTE — Consult Note (Signed)
[place]  Patient:    CHRISTL, FESSENDEN Visit Number: [id number] MRN: [id number]          Service Type: MED Location: 3100 [id number] Attending Physician:  Gwenyth Bender Dictated by:   Deanna Artis. Sharene Skeans, M.D. Proc. Date: 07/17/01 Admit Date:  07/17/2001   CC:         Minerva Areola L. August Saucer, M.D.  Eduardo Osier. Sharyn Lull, M.D.   Consultation Report  NEUROLOGY CONSULTATION REPORT  DATE OF BIRTH:  1945-03-31  REASON FOR CONSULTATION:  I was asked by Dr. Sharyn Lull (covering for Dr. August Saucer) to see this patient in consultation at the [facility] emergency department for evaluation of unresponsiveness.  HISTORY OF PRESENT ILLNESS:  The patient went to bed last night with complaints of tingling of her lips.  She had not been ill during the day.  She had had no head injury and had not had problems with headache or difficulties with her blood sugar control.  This morning her husband tried to awaken her around 6 oclock, he was unable to do so.  She was brought to the [facility] emergency department where she was evaluated by Dr. Carleene Cooper, arriving here around 8:18.  The patient was evaluated and did not have evidence of hypoglycemia, severe hypertension and had no obvious fluid and electrolyte abnormalities nor signs of drug toxicity with the exception of urine drug screen positive for benzodiazepines (she takes a night time sleeping pill).  The CT scan of the brain was reported as showing a subtle medullary lesion.  It appears to me to be a possible lesion in the dorsal mid brain and two very tiny lesions in the thalami bilaterally.  I cannot tell if these are real, some form of volume averaging or artifact.  I was asked to see her to determine the etiology of her unresponsiveness and to make recommendations for further work-up and treatment.  PAST MEDICAL HISTORY:  Remarkable for non-insulin-dependent diabetes mellitus, hypertension, which  has been well controlled, history of depression.  She has also had long standing tobacco abuse.  PAST SURGICAL HISTORY:  The patient had a left iridectomy.  She has also had tubal ligation.  REVIEW OF SYSTEMS:  The patient has had no fever, chills, productive cough, respiratory distress, nausea, vomiting, diarrhea, bleeding dyscrasias, rash, joint or neck pain.  She has had no chest pain, no palpitations. She has had occasional headaches but did not complain of any recently.  She has not had any known problems with vision, hearing, chewing, swallowing, speech, strength or coordination of her limbs, syncope or seizures.  She has never had an episode like this previously.  CURRENT MEDICATIONS: 1. Ketoprofen 75 mg t.i.d. 2. Amitriptyline 10 mg per day. 3. Prilosec 20 mg per day. 4. Ambien 10 mg at bedtime. 5. Reglan 5 mg t.i.d. 6. Clorazepate 7.5 mg t.i.d. 7. Actos 30 mg per day. 8. Accupril 20 mg per day.  ALLERGIES TO MEDICATIONS:  None known.  FAMILY HISTORY:  Positive for cerebrovascular accident in her mother who also had a myocardial infarction.  History of atherosclerotic cardiovascular disease, hypertension.  SOCIAL HISTORY:  The patient is married and has four children.  She has smoked 1 pack of cigarettes per day for over 20 years.  She does not use alcohol. She works for [employer].  PHYSICAL EXAMINATION: GENERAL:  On examination today the patient is unresponsive lying on the stretcher.  She did not open  her eyes, respond to commands nor speak.  She was able to be aroused to localized noxious stimuli, see below.  VITAL SIGNS:  Blood pressure 125/69, resting pulse 66, respirations 20, temperature 98.  HEENT:  Ear, nose and throat supple.  NECK:  No bruits.  No signs of infection.  LUNGS:  Clear.  HEART:  No murmurs.  PULSES: Normal.  ABDOMEN:  Soft.  Bowel sounds normal.  EXTREMITIES:  No edema or cyanosis.  SKIN:  No lesions.  NEUROLOGICAL  EXAMINATION:  Mental status:  See above.  Cranial nerves:  Right pupil is sluggish, normal fundus.  Left pupil is irregular, does not respond. I cannot see the fundus.  I wonder if she has had a retinal detachment.  There is no dolls eye maneuver.  She has equal facial grimace.  She has positive and equal corneals, positive gag.  Motor examination:  Patient withdraws and localizes to pain.  She moves her upper extremities purposely in localizing pain and her lower extremities semi-purposely in withdrawing from pain.  She does have some fine motor movements in both hands.  Cerebellar examination and gait could not be tested.  Deep tendon reflexes are diminished and equal.  She had bilateral extensor plantar responses.  IMPRESSION:  Unresponsiveness as part of delirium 293.0.  Likely etiologies are dorsal mid brain infarction which would bother her reticular activating formation and cause obtundation and poor pupillary reaction without causing significant weakness.  The other possibility, given the medications that she takes, would include toxic delirium from her medications.  Seems unlikely that she has sepsis or meningitis as an etiology of her dysfunction.  PLAN:  Magnetic resonance imaging of brain, MRA intracranial this morning.  If we find evidence of a stroke she will need to have further work-up for ischemic stroke. If none is found, then we may need to consider lumbar puncture and electroencephalogram in the morning.  At present, the patient should receive no medications orally because she is unresponsive.  I appreciate the opportunity to see her and will continue to follow her along with you unless she has a stroke, in which case she will be placed on our stroke consultation service.  SUMMARY OF LABORATORY:  I have reviewed her CT scan personally and as  mentioned in my interpretation above, I see no remote strokes and the lesions that I see are subtle at best.  White blood  cell count 7,800, hemoglobin 12.9, hematocrit 39.3, MCV 89.2, platelet count 204,000.  Neutrophils 38%, lymphocytes 49%, monocytes 10%, eosinophils 2%, basophils 1%.  Urinalysis: specific gravity 1.014, pH 6.0, leukocyte esterase and nitrites negative.  Urine toxicology screen positive for benzodiazepines, negative for others.  Prothrombin time 12.1, INR 0.8, PTT 29. I-STAT sodium 140, potassium 4.7, chloride 106, carbon dioxide 25, hemoglobin 15, hematocrit 44, glucose 112. PH 7.37, creatinine 1.3.  Repeat pH 7.34. PCO2 51.2, pO2 63 on room air.  Electrocardiogram normal sinus rhythm, nonspecific ST-T wave abnormalities. Dictated by:   Deanna Artis. Sharene Skeans, M.D. Attending Physician:  Gwenyth Bender DD:  07/17/01 TD:  07/19/01 Job: [id number]

## 2010-05-23 NOTE — Discharge Summary (Signed)
NAME:  [name], [name]                             ACCOUNT NO.:  [id number]   MEDICAL RECORD NO.:  [id number]                   PATIENT TYPE:  INP   LOCATION:  3107                                 FACILITY:  [facility]   PHYSICIAN:  [name] [name]. August Saucer, M.D.                  DATE OF BIRTH:  1945-10-21   DATE OF ADMISSION:  07/17/2001  DATE OF DISCHARGE:  07/22/2001                                 DISCHARGE SUMMARY   FINAL DIAGNOSES:  1. Cerebral arterial occlusion with cerebral infarction, 434.91.  2. Urinary tract infection, 599.0.  3. Pulmonary collapse, 598.0.  4. 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  5. 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  6. Hypertension, 401.9.  7. Diabetes mellitus type 2, non-insulin dependent, 250.00.  8. Depressive disorder, 311.  9. Tobacco use disorder, 305.1.  10.      Long-term use of anticoagulation, V58.61.   OPERATIONS/PROCEDURES:  None.   HISTORY OF PRESENT ILLNESS:  The patient is a 65 year old married black  female with a past history of hypertension, non-insulin dependent diabetes  mellitus, depression, and tobacco use who was brought to the emergency room  on July 17, 2001, by family after being found unresponsive.  The patient had  earlier complained of headache and tingling of the lips, but had otherwise  been doing well.  There as no associated nausea and vomiting, or blurred  vision during her initial presentation of symptoms.  She subsequently,  however, was later found to be unresponsive at home and was brought to the  emergency room for further evaluation.   PAST MEDICAL HISTORY:  As per admission H&P.   PHYSICAL EXAMINATION:  As per admission H&P.   HOSPITAL COURSE:  The patient was initially evaluated by Dr. Sharyn Lull.  It  was felt she had marked alteration in mental status.  A CT scan of the head  was obtained followed by MRI scanning.  This demonstrated an acute  infarction affecting the mid brain extending into both thalamic regions.  The patient was  admitted to the neurologic intensive care unit.  She was  placed on heparin and aspirin protocol.  She was followed closely initially  by Dr. Sharene Skeans as well as Dr. Anne Hahn.  The patient did also during this  time require sliding scale insulin for fluctuating blood sugars.   Over the subsequent days, she made very slow progress.  She remained  essentially somnolent for an extended period of time.  Further source of her  stroke was pursued.  Carotid Dopplers showed no significant internal carotid  artery stenosis.  She subsequently also underwent 2-D echo with TEE which  did not demonstrate any evidence for an embolic phenomenon.   Eventually, with continued supportive care, the patient made gradual  progress.  She became more alert to the point of being able to eat.  She was  evaluated by Speech Therapy with a modified  barium swallow done.  She had  some delay in phonation.  She was placed on a puree diet with nectar, thick  liquids.  She was seen by OT and PT. as well and was felt to be a candidate  for rehab and/or SACU. Admission.  After several days of intensive medical  management, the patient did become more alert.  On July 21, 2001, she spiked  a temperature to 102.  Blood and urine cultures were obtained.  She was  subsequently found to have a urinary tract infection for which she was  treated with Tequin.  She subsequently did well thereafter with her  temperature defervescing.  By July 22, 2001, she was felt to be stable for  transfer to the Rehab Unit for continuing care.   At the time of transfer she was more alert with vital signs stable.  Plans  were for more intensive OT and PT.   DISCHARGE MEDICATIONS:  1. Protonix 40 mg p.o. q.12h.  2. Tequin 400 mg p.o. q.d.  3. Aspirin 325 mg q.d.  4. Zocor 40 mg q.d.  5. Heparin per pharmacy protocol.  6. Sliding scale insulin as needed.  Scale at that time was for CBGs 201-250     two units of Novolin R, 251-300 four units,  301-350 six units, 351-400     eight units.   PROGNOSIS:  Remains guarded.                                                [name] [name]. August Saucer, M.D.    ELD/MEDQ  D:  08/31/2001  T:  09/03/2001  Job:  [id number]

## 2010-05-23 NOTE — Assessment & Plan Note (Signed)
[name] is back regarding her midbrain stroke.  She has had eye surgery by Dr.  Karleen Hampshire and had good results.  Apparently she needs cataract surgery to the  left side as well.  She is doing her basic ADLs at supervision level  currently.  She needs minimal assistance with shoes.  She walks with hand-  held assistance.  She does stagger on her own apparently at home for short  distances when husband is nearby.  She and husband deny any falls.  The  patient notes that her mood is good and she is sleeping well. Overall, she  has been steady from a neurological standpoint.   REVIEW OF SYMPTOMS:  The patient denies any chest pain, shortness of breath,  problems with numbness, spasms, or stroke symptoms.  Sleep has been fair.  No mood issues are noted.  The patient denies any nausea, vomiting, bowel or  bladder symptoms.  She denies fever, chills, weight loss or gains.  Sugars  have been well controlled.   PHYSICAL EXAMINATION:  VITAL SIGNS:  Blood pressure is 171/89, pulse 88,  respiratory rate 14, she is saturating 99% on room air.   The patient walks with a limp and ataxic gait, favoring the right side.  She  has some problems clearing the right leg.  The patient is alert and affect  is good.  She remains dysarthric with her speech but has good volume and is  generally intelligible.  Hand strength is 3+ to 4/5.  Still has some static  movements noted.  Right lower extremity strength and coordination is similar  at 3+ to 4/5.  She had a little better awareness of the right side than  previously.  The patient's gaze deficit is improved but still seems to have  some problems with conjugate gaze overall.  Right upper extremity is  nontender and tone was dystonic but not persistently increased.   ASSESSMENT:  1. 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  2. Bicipital tendinitis.  3. 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  4. Depression.   PLAN:  1. The patient is static  from a neurological standpoint at this point.  She     is doing well at a supervision level at home.  No further suggestions     from a rehab standpoint at this point.  2. From standpoint of her mood, she has been stable here as well.  I think     we can start to taper her Remeron off over the next several weeks' time.     I gave her a prescription 15 mg q.h.s. to begin once the current bottle     is empty and then she may stop.  If there are any problems, she is     welcome to call me.  The patient will follow up with Dr. August Saucer in about     three months' time for her regular medical checkup.  The patient will     remain on Effexor XR 75 mg daily.  3. The patient will continue on aspirin one daily for stroke prophylaxis.  4. I will see the patient back on a p.r.n. basis.      Ranelle Oyster, M.D.   ZTS/MedQ  D:  05/30/2003 12:04:27  T:  05/30/2003 13:13:06  Job #:  [id number]   cc:   Minerva Areola L. August Saucer, M.D.  [address] [address]  [place]  [place] [address]  Fax: [phone]

## 2010-06-01 ENCOUNTER — Emergency Department (HOSPITAL_COMMUNITY): Payer: Medicare Other

## 2010-06-01 ENCOUNTER — Emergency Department (HOSPITAL_COMMUNITY)
Admission: EM | Admit: 2010-06-01 | Discharge: 2010-06-01 | Disposition: A | Discharge status: Home / Self Care | Payer: Medicare Other | Attending: Emergency Medicine | Admitting: Emergency Medicine

## 2010-06-01 DIAGNOSIS — M25539 Pain in unspecified wrist: Secondary | ICD-10-CM | POA: Insufficient documentation

## 2010-06-01 DIAGNOSIS — M79609 Pain in unspecified limb: Secondary | ICD-10-CM | POA: Insufficient documentation

## 2010-06-01 DIAGNOSIS — S0003XA Contusion of scalp, initial encounter: Secondary | ICD-10-CM | POA: Insufficient documentation

## 2010-06-01 DIAGNOSIS — S1093XA Contusion of unspecified part of neck, initial encounter: Secondary | ICD-10-CM | POA: Insufficient documentation

## 2010-06-01 DIAGNOSIS — Z86718 Personal history of other venous thrombosis and embolism: Secondary | ICD-10-CM | POA: Insufficient documentation

## 2010-06-01 DIAGNOSIS — S60229A Contusion of unspecified hand, initial encounter: Secondary | ICD-10-CM | POA: Insufficient documentation

## 2010-06-01 DIAGNOSIS — E119 Type 2 diabetes mellitus without complications: Secondary | ICD-10-CM | POA: Insufficient documentation

## 2010-06-01 DIAGNOSIS — Z8679 Personal history of other diseases of the circulatory system: Secondary | ICD-10-CM | POA: Insufficient documentation

## 2010-06-01 DIAGNOSIS — W1809XA Striking against other object with subsequent fall, initial encounter: Secondary | ICD-10-CM | POA: Insufficient documentation

## 2010-06-01 DIAGNOSIS — Y92009 Unspecified place in unspecified non-institutional (private) residence as the place of occurrence of the external cause: Secondary | ICD-10-CM | POA: Insufficient documentation

## 2010-09-06 ENCOUNTER — Emergency Department (HOSPITAL_COMMUNITY): Payer: Medicare Other

## 2010-09-06 ENCOUNTER — Inpatient Hospital Stay (HOSPITAL_COMMUNITY)
Admission: EM | Admit: 2010-09-06 | Discharge: 2010-09-11 | DRG: 312 | Disposition: A | Discharge status: Home Health | Payer: Medicare Other | Attending: Internal Medicine | Admitting: Internal Medicine

## 2010-09-06 DIAGNOSIS — R55 Syncope and collapse: Principal | ICD-10-CM | POA: Diagnosis present

## 2010-09-06 DIAGNOSIS — N189 Chronic kidney disease, unspecified: Secondary | ICD-10-CM | POA: Diagnosis present

## 2010-09-06 DIAGNOSIS — W06XXXA Fall from bed, initial encounter: Secondary | ICD-10-CM | POA: Diagnosis present

## 2010-09-06 DIAGNOSIS — E538 Deficiency of other specified B group vitamins: Secondary | ICD-10-CM | POA: Diagnosis present

## 2010-09-06 DIAGNOSIS — I129 Hypertensive chronic kidney disease with stage 1 through stage 4 chronic kidney disease, or unspecified chronic kidney disease: Secondary | ICD-10-CM | POA: Diagnosis present

## 2010-09-06 DIAGNOSIS — I498 Other specified cardiac arrhythmias: Secondary | ICD-10-CM | POA: Diagnosis present

## 2010-09-06 DIAGNOSIS — D649 Anemia, unspecified: Secondary | ICD-10-CM | POA: Diagnosis present

## 2010-09-06 DIAGNOSIS — E669 Obesity, unspecified: Secondary | ICD-10-CM | POA: Diagnosis present

## 2010-09-06 DIAGNOSIS — E119 Type 2 diabetes mellitus without complications: Secondary | ICD-10-CM | POA: Diagnosis present

## 2010-09-06 DIAGNOSIS — Y92009 Unspecified place in unspecified non-institutional (private) residence as the place of occurrence of the external cause: Secondary | ICD-10-CM

## 2010-09-06 DIAGNOSIS — N179 Acute kidney failure, unspecified: Secondary | ICD-10-CM | POA: Diagnosis present

## 2010-09-06 DIAGNOSIS — E860 Dehydration: Secondary | ICD-10-CM | POA: Diagnosis present

## 2010-09-06 DIAGNOSIS — Z86718 Personal history of other venous thrombosis and embolism: Secondary | ICD-10-CM

## 2010-09-06 DIAGNOSIS — Z7901 Long term (current) use of anticoagulants: Secondary | ICD-10-CM

## 2010-09-06 DIAGNOSIS — I69959 Hemiplegia and hemiparesis following unspecified cerebrovascular disease affecting unspecified side: Secondary | ICD-10-CM

## 2010-09-06 DIAGNOSIS — I959 Hypotension, unspecified: Secondary | ICD-10-CM | POA: Diagnosis present

## 2010-09-06 LAB — COMPREHENSIVE METABOLIC PANEL
Albumin: 3.7 g/dL (ref 3.5–5.2)
BUN: 27 mg/dL — ABNORMAL HIGH (ref 6–23)
CO2: 22 mEq/L (ref 19–32)
Chloride: 101 mEq/L (ref 96–112)
Creatinine, Ser: 3.1 mg/dL — ABNORMAL HIGH (ref 0.50–1.10)
GFR calc Af Amer: 18 mL/min — ABNORMAL LOW (ref >60)
GFR calc non Af Amer: 15 mL/min — ABNORMAL LOW (ref >60)
Total Bilirubin: 0.1 mg/dL — ABNORMAL LOW (ref 0.3–1.2)

## 2010-09-06 LAB — CBC
Hemoglobin: 9.2 g/dL — ABNORMAL LOW (ref 12.0–15.0)
MCH: 25.3 pg — ABNORMAL LOW (ref 26.0–34.0)
MCHC: 31 g/dL (ref 30.0–36.0)

## 2010-09-06 LAB — DIFFERENTIAL
Basophils Relative: 0 % (ref 0–1)
Eosinophils Absolute: 0.1 10*3/uL (ref 0.0–0.7)
Monocytes Absolute: 0.8 10*3/uL (ref 0.1–1.0)
Monocytes Relative: 7 % (ref 3–12)
Neutro Abs: 6.5 10*3/uL (ref 1.7–7.7)

## 2010-09-06 LAB — POCT I-STAT TROPONIN I: Troponin i, poc: 0 ng/mL (ref 0.00–0.08)

## 2010-09-06 LAB — PROTIME-INR
INR: 5.26 (ref 0.00–1.49)
Prothrombin Time: 49 seconds — ABNORMAL HIGH (ref 11.6–15.2)

## 2010-09-07 LAB — LIPID PANEL: LDL Cholesterol: 45 mg/dL (ref 0–99)

## 2010-09-07 LAB — GLUCOSE, CAPILLARY
Glucose-Capillary: 106 mg/dL — ABNORMAL HIGH (ref 70–99)
Glucose-Capillary: 109 mg/dL — ABNORMAL HIGH (ref 70–99)

## 2010-09-07 LAB — CK TOTAL AND CKMB (NOT AT ARMC)
CK, MB: 1.8 ng/mL (ref 0.3–4.0)
Relative Index: INVALID (ref 0.0–2.5)
Total CK: 91 U/L (ref 7–177)

## 2010-09-07 LAB — TSH: TSH: 1.165 u[IU]/mL (ref 0.350–4.500)

## 2010-09-07 LAB — CARDIAC PANEL(CRET KIN+CKTOT+MB+TROPI): Relative Index: 1.9 (ref 0.0–2.5)

## 2010-09-07 LAB — TROPONIN I: Troponin I: <0.3 ng/mL (ref <0.30)

## 2010-09-08 ENCOUNTER — Inpatient Hospital Stay (HOSPITAL_COMMUNITY): Payer: Medicare Other

## 2010-09-08 LAB — PROTIME-INR
INR: 2.53 — ABNORMAL HIGH (ref 0.00–1.49)
Prothrombin Time: 27.7 seconds — ABNORMAL HIGH (ref 11.6–15.2)

## 2010-09-08 LAB — CBC
HCT: 27.4 % — ABNORMAL LOW (ref 36.0–46.0)
Hemoglobin: 8.6 g/dL — ABNORMAL LOW (ref 12.0–15.0)
MCHC: 31.4 g/dL (ref 30.0–36.0)
MCV: 81.8 fL (ref 78.0–100.0)
RDW: 16.3 % — ABNORMAL HIGH (ref 11.5–15.5)
WBC: 6.1 10*3/uL (ref 4.0–10.5)

## 2010-09-08 LAB — BASIC METABOLIC PANEL
BUN: 26 mg/dL — ABNORMAL HIGH (ref 6–23)
Chloride: 109 mEq/L (ref 96–112)
Creatinine, Ser: 2.01 mg/dL — ABNORMAL HIGH (ref 0.50–1.10)
GFR calc Af Amer: 30 mL/min — ABNORMAL LOW (ref >60)
Glucose, Bld: 96 mg/dL (ref 70–99)

## 2010-09-08 LAB — GLUCOSE, CAPILLARY
Glucose-Capillary: 91 mg/dL (ref 70–99)
Glucose-Capillary: 115 mg/dL — ABNORMAL HIGH (ref 70–99)
Glucose-Capillary: 96 mg/dL (ref 70–99)

## 2010-09-08 LAB — VITAMIN B12: Vitamin B-12: 152 pg/mL — ABNORMAL LOW (ref 211–911)

## 2010-09-08 LAB — FOLATE: Folate: 11.5 ng/mL

## 2010-09-08 LAB — FERRITIN: Ferritin: 13 ng/mL (ref 10–291)

## 2010-09-09 ENCOUNTER — Inpatient Hospital Stay (HOSPITAL_COMMUNITY): Payer: Medicare Other

## 2010-09-09 LAB — BASIC METABOLIC PANEL
BUN: 36 mg/dL — ABNORMAL HIGH (ref 6–23)
CO2: 23 mEq/L (ref 19–32)
Chloride: 108 mEq/L (ref 96–112)
Creatinine, Ser: 2.05 mg/dL — ABNORMAL HIGH (ref 0.50–1.10)
GFR calc Af Amer: 29 mL/min — ABNORMAL LOW (ref >60)
Potassium: 5 mEq/L (ref 3.5–5.1)

## 2010-09-09 LAB — GLUCOSE, CAPILLARY
Glucose-Capillary: 103 mg/dL — ABNORMAL HIGH (ref 70–99)
Glucose-Capillary: 105 mg/dL — ABNORMAL HIGH (ref 70–99)
Glucose-Capillary: 101 mg/dL — ABNORMAL HIGH (ref 70–99)

## 2010-09-09 LAB — CBC
HCT: 28 % — ABNORMAL LOW (ref 36.0–46.0)
Hemoglobin: 8.7 g/dL — ABNORMAL LOW (ref 12.0–15.0)
MCH: 25 pg — ABNORMAL LOW (ref 26.0–34.0)
MCHC: 31.1 g/dL (ref 30.0–36.0)
MCV: 80.5 fL (ref 78.0–100.0)
RBC: 3.48 MIL/uL — ABNORMAL LOW (ref 3.87–5.11)

## 2010-09-10 LAB — CBC
HCT: 30.9 % — ABNORMAL LOW (ref 36.0–46.0)
MCHC: 33 g/dL (ref 30.0–36.0)
RDW: 15.6 % — ABNORMAL HIGH (ref 11.5–15.5)
WBC: 7.6 10*3/uL (ref 4.0–10.5)

## 2010-09-10 LAB — COMPREHENSIVE METABOLIC PANEL
ALT: 7 U/L (ref 0–35)
AST: 14 U/L (ref 0–37)
Albumin: 3.2 g/dL — ABNORMAL LOW (ref 3.5–5.2)
Alkaline Phosphatase: 117 U/L (ref 39–117)
Chloride: 110 mEq/L (ref 96–112)
Potassium: 4.6 mEq/L (ref 3.5–5.1)
Sodium: 141 mEq/L (ref 135–145)
Total Bilirubin: 0.2 mg/dL — ABNORMAL LOW (ref 0.3–1.2)
Total Protein: 7.1 g/dL (ref 6.0–8.3)

## 2010-09-10 LAB — GLUCOSE, CAPILLARY
Glucose-Capillary: 92 mg/dL (ref 70–99)
Glucose-Capillary: 106 mg/dL — ABNORMAL HIGH (ref 70–99)

## 2010-09-10 LAB — TYPE AND SCREEN
ABO/RH(D): O POS
Antibody Screen: POSITIVE

## 2010-09-10 LAB — PROTIME-INR
INR: 1.72 — ABNORMAL HIGH (ref 0.00–1.49)
Prothrombin Time: 20.5 seconds — ABNORMAL HIGH (ref 11.6–15.2)

## 2010-09-10 LAB — PRO B NATRIURETIC PEPTIDE: Pro B Natriuretic peptide (BNP): 259 pg/mL — ABNORMAL HIGH (ref 0–125)

## 2010-09-11 LAB — CROSSMATCH
ABO/RH(D): O POS
Donor AG Type: NEGATIVE

## 2010-09-11 LAB — COMPREHENSIVE METABOLIC PANEL
ALT: 6 U/L (ref 0–35)
Albumin: 3.3 g/dL — ABNORMAL LOW (ref 3.5–5.2)
Alkaline Phosphatase: 118 U/L — ABNORMAL HIGH (ref 39–117)
Chloride: 109 mEq/L (ref 96–112)
Glucose, Bld: 96 mg/dL (ref 70–99)
Potassium: 4.5 mEq/L (ref 3.5–5.1)
Sodium: 140 mEq/L (ref 135–145)
Total Protein: 7.4 g/dL (ref 6.0–8.3)

## 2010-09-11 LAB — CBC
HCT: 32.3 % — ABNORMAL LOW (ref 36.0–46.0)
Hemoglobin: 10.5 g/dL — ABNORMAL LOW (ref 12.0–15.0)
MCHC: 32.5 g/dL (ref 30.0–36.0)
MCV: 81.4 fL (ref 78.0–100.0)

## 2010-09-11 LAB — PROTIME-INR: INR: 1.97 — ABNORMAL HIGH (ref 0.00–1.49)

## 2010-09-11 LAB — D-DIMER, QUANTITATIVE: D-Dimer, Quant: <0.22 ug/mL-FEU (ref 0.00–0.48)

## 2010-09-21 NOTE — H&P (Signed)
[name], [name]                   ACCOUNT NO.:  [id number]  MEDICAL RECORD NO.:  [id number]  LOCATION:  3307                         FACILITY:  [facility]  PHYSICIAN:  Della Goo, M.D. DATE OF BIRTH:  Dec 31, 1945  DATE OF ADMISSION:  09/06/2010 DATE OF DISCHARGE:                             HISTORY & PHYSICAL   PRIMARY CARE PHYSICIAN:  Dr. Willey Blade.  CHIEF COMPLAINT:  Fall.  HISTORY OF PRESENT ILLNESS:  This is a 65 year old female, who was brought to the emergency department after suffering a fall at home.  She has a history of CVA with right-sided weakness and was reported as being in her bed, which is a low-bed, but slid off the bed onto the floor. She was found by her husband.  It is not clear whether she passed out or not.  The patient was brought to the emergency department for further evaluation.  In the emergency department, the patient was found to have hypotension.  I thought blood pressure was in the 70s.  Her husband reports that she was started on new medication to help decrease her heart rate yesterday.  He does not know the name of medication.  He was told by his primary care physician to give her a half of the pill and she started half a pill in the prior evening and one half of the pill in the morning.  He states that the patient was having difficulty with her walking even though she does walk with assistance today.  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  PAST SURGICAL HISTORY:  History of bilateral tubal ligation.  CURRENT MEDICATIONS:  Need to be further verified.  The patient is on Coumadin therapy.  ALLERGIES:  No known drug allergies.  SOCIAL HISTORY:  The patient is married.  She is a nonsmoker and nondrinker.  No history of  illicit drug usage.  FAMILY HISTORY:  Noncontributory.  REVIEW OF SYSTEMS:  Pertinents as mentioned above.  PHYSICAL EXAMINATION:  GENERAL:  This is a 65 year old, sickly-appearing and older than stated age appearing, African-American female who is overweight and well-developed and in mild distress. VITAL SIGNS:  Temperature 99.2, blood pressure initially 78/52 now 95/62, heart rate 89, respiratory rate 20, and O2 saturation is 99%. HEENT:  Normocephalic, atraumatic.  Pupils equally round and reactive to light.  Extraocular movements are intact.  Funduscopic benign, except she does have cataract debris in the left eye.  There is no scleral icterus.  Nares are patent bilaterally.  Oropharynx is clear.  There are no tongue lacerations. NECK:  Supple.  Full range of motion.  No thyromegaly, adenopathy, or jugular venous distention. CARDIOVASCULAR:  Regular rate and rhythm.  No murmurs, gallops, or rubs appreciated. LUNGS:  Clear to auscultation bilaterally.  Chest wall is nontender. Breathing is unlabored.  Chest wall excursion is symmetric. ABDOMEN:  Positive bowel sounds, soft, nontender, nondistended.  No hepatosplenomegaly. EXTREMITIES:  Without cyanosis, clubbing, or edema. NEUROLOGIC:  Significant for right-sided hemiparesis.  LABORATORY STUDIES:  White blood cell count 10.6, hemoglobin 9.2, hematocrit 29.7, MCV 81.6, platelets 169, neutrophils 61%, lymphocytes 31%.  Sodium 135, potassium 4.7, chloride 101, carbon dioxide 22, BUN 27, creatinine 3.10, and glucose of 134.  Troponin 0.00.  ProTime 49.0 and INR 5.26.  CT scan of the head performed, revealing no acute intracranial findings.  Remote thalamic infarctions are seen.  ASSESSMENT:  This is 65 year old female being admitted with: 1. Syncope. 2. Fall. 3. Hypotension. 4. Microcytic anemia. 5. Supratherapeutic Coumadin level. 6. History of a deep venous thrombosis of the left lower extremity. 7. Type 2 diabetes  mellitus. 8. Acute renal failure. 9. Cerebrovascular accident history with a right-sided hemiparesis. 10.Obesity.  PLAN:  The patient will be admitted to Step-Down ICU secondary to her hypotension.  She will continue on gentle fluid rehydration and resuscitation.  MRI and MRA study of the brain has been ordered along with carotid ultrasound study.  Cardiac enzymes will also be performed. The patient will be placed on sliding-scale insulin coverage and her glucose levels will be checked.  Her regular medications will be further verified.  Any beta-blocker therapy will be held for now.  The patient's Coumadin therapy will be adjusted per pharmacy, more than likely her Coumadin will be held for 1-2 days.  She is not exhibiting any signs of bleeding at this time and therefore, the patient does not need administration of FFP or vitamin K.  However, she will be monitored in the event that her condition changes.  CODE STATUS:  Full code at this time.     Della Goo, M.D.     HJ/MEDQ  D:  09/07/2010  T:  09/07/2010  Job:  [id number]  cc:   [name], M.D.  Electronically Signed by Della Goo M.D. on 09/21/2010 09:40:44 PM

## 2010-09-26 LAB — CBC
HCT: 29.5 — ABNORMAL LOW
Hemoglobin: 9.7 — ABNORMAL LOW
MCHC: 32.8
MCV: 86.7
Platelets: 398
RBC: 3.4 — ABNORMAL LOW
RDW: 16.1 — ABNORMAL HIGH
WBC: 7.6

## 2010-09-26 LAB — I-STAT 8, (EC8 V) (CONVERTED LAB)
BUN: 12
Glucose, Bld: 98
Hemoglobin: 11.2 — ABNORMAL LOW
Potassium: 4.5
Sodium: 136
TCO2: 22

## 2010-09-26 LAB — URINALYSIS, ROUTINE W REFLEX MICROSCOPIC
Bilirubin Urine: NEGATIVE
Glucose, UA: NEGATIVE
Ketones, ur: NEGATIVE
Nitrite: NEGATIVE
Protein, ur: 30 — AB
Specific Gravity, Urine: 1.012
Urobilinogen, UA: 0.2
pH: 5.5

## 2010-09-26 LAB — URINE MICROSCOPIC-ADD ON

## 2010-09-26 LAB — D-DIMER, QUANTITATIVE: D-Dimer, Quant: 0.27

## 2010-09-26 LAB — DIFFERENTIAL
Basophils Absolute: 0.1
Basophils Relative: 1
Eosinophils Absolute: 0
Eosinophils Relative: 0
Lymphocytes Relative: 20
Lymphs Abs: 1.5
Monocytes Absolute: 0.9
Monocytes Relative: 12
Neutro Abs: 5.1
Neutrophils Relative %: 67

## 2010-09-26 LAB — PROTIME-INR
INR: 1.3
Prothrombin Time: 16.2 — ABNORMAL HIGH

## 2010-10-07 LAB — CBC
HCT: 33.5 — ABNORMAL LOW
Hemoglobin: 10.8 — ABNORMAL LOW
RDW: 15.9 — ABNORMAL HIGH

## 2010-10-07 LAB — BASIC METABOLIC PANEL
BUN: 9
Chloride: 107
GFR calc Af Amer: 49 — ABNORMAL LOW
Potassium: 4.4
Sodium: 141

## 2010-10-07 LAB — GLUCOSE, CAPILLARY
Glucose-Capillary: 102 — ABNORMAL HIGH
Glucose-Capillary: 89

## 2010-10-07 LAB — APTT: aPTT: 40 — ABNORMAL HIGH

## 2010-10-16 NOTE — Discharge Summary (Signed)
[name], [name]                   ACCOUNT NO.:  [id number]  MEDICAL RECORD NO.:  [id number]  LOCATION:  3734                         FACILITY:  [facility]  PHYSICIAN:  Jovanka Westgate L. August Saucer, M.D.     DATE OF BIRTH:  04/09/1945  DATE OF ADMISSION:  09/06/2010 DATE OF DISCHARGE:  09/11/2010                              DISCHARGE SUMMARY   FINAL DIAGNOSES: 1. Syncope secondary to dehydration. 2. Sinus tachycardia secondary to syncope secondary to dehydration. 3. Chronic kidney disease with exacerbation secondary to dehydration. 4. Vitamin B12 deficiency with pernicious anemia. 5. Diabetes mellitus. 6. Supratherapeutic INR. 7. Anemia, multifactorial. 8. History of left cerebrovascular accident with residual hemiparesis. 9. History of deep vein thrombosis on Coumadin therapy.  OPERATION/PROCEDURES:  Transfusion of 1 unit of packed RBCs.  HISTORY OF PRESENT ILLNESS:  Patient is a 65 year old married black female, who reports to emergency room after suffering a fall at home. Patient has history of CVA and right-sided weakness and was reported as being in her bed, which was low in nature, but slid off the bed onto the floor.  She was found by her husband.  It was not clear initially if the patient had passed out.  Patient was brought to the emergency room for further evaluation.  In the emergency room, patient was noted to be hypotensive.  Her systolic pressure was in the 70s.  Patient did not improve in emergency room.  She was subsequently admitted for further evaluation and therapy.  Past medical history and physical exam as per admission H and P.  HOSPITAL COURSE:  Patient was admitted for further evaluation of recent syncopal event.  She was noted to be dehydrated with ongoing hypotension.  Blood pressure initially was 78/52 in the emergency room. After hydration, it rose to 92/62.  Patient was initially admitted to Step-Down ICU for further evaluation.  There she was continued to undergo  gentle fluid rehydration.  She did undergo further evaluation with MRI and MRI study of her brain.  There was no acute intracranial findings noted.  She was noted to have a remote thalamic infarction. Patient was transferred to my service on September 09, 2010.  She was gradually feeling better at that time.  She denied any headaches, chest pains, or dyspnea.  Her hemoglobin was 8.7 at that time.  Her creatinine was 2.05.  Her hemoglobin A1c was 6.6.  Her INR was 2.53.  Patient was continued on supportive care thereafter.  Because of her previous tachycardia and orthostasis, she was transfused 1 unit of packed RBCs, which she tolerated well.  She was also noted to have a low vitamin B12 at 152.  She was started on supplementation for this as well.  It was felt this was suggestive of pernicious anemia.  All the subsequent days, the patient made slow, but steady improvement. Hemoglobin did rise at 10.2.  Creatinine with hydration did also gradually dropped to 1.89.  Patient was seen by Physical Therapy.  She did undergo gradual ambulation as tolerated.  Her tachycardia did improve after transfusion.  Question of home versus rehab was reviewed.  Patient, however, made steady improvement of her gait.  It  was felt that further treatment could pursued as an outpatient at home.  By September 11, 2010, patient was feeling considerably better.  She denied any lightheadedness or dizziness.  Her blood pressure was 110/67 without orthostasis.  Hemoglobin was 10.5, creatinine of 1.93.  She was subsequently discharged to home.  MEDICATIONS:  At time of discharge consisted of: 1. Vitamin B12 1000 mcg daily. 2. Warfarin 5 mg daily. 3. Amitriptyline 10 mg daily. 4. Atorvastatin 20 mg daily. 5. Fluoxetine 10 mg daily. 6. Omeprazole 20 mg daily. Patient will be maintained on a low-sodium, heart-healthy diet.  She is to avoid high vitamin K foods.  She will need to have her PT/INR checked on September 15, 2010.  She will be seen in office in 10 days time.  Patient's future use of the Coumadin will be reviewed as she has been on this for several years.  As risk of falls do increase the risk of intracranial bleeds, may outweigh the benefits for continued Coumadin therapy.          ______________________________ Lind Guest. August Saucer, M.D.     ELD/MEDQ  D:  10/15/2010  T:  10/15/2010  Job:  [id number]  Electronically Signed by Willey Blade M.D. on 10/16/2010 11:49:53 AM

## 2011-03-02 ENCOUNTER — Other Ambulatory Visit: Payer: Self-pay | Admitting: Internal Medicine

## 2011-03-02 ENCOUNTER — Ambulatory Visit
Admission: RE | Admit: 2011-03-02 | Discharge: 2011-03-02 | Disposition: A | Discharge status: Home / Self Care | Payer: Medicare Other | Source: Ambulatory Visit | Attending: Internal Medicine | Admitting: Internal Medicine

## 2011-03-02 DIAGNOSIS — S299XXA Unspecified injury of thorax, initial encounter: Secondary | ICD-10-CM

## 2012-02-24 ENCOUNTER — Other Ambulatory Visit: Payer: Self-pay | Admitting: Internal Medicine

## 2012-02-24 DIAGNOSIS — R0602 Shortness of breath: Secondary | ICD-10-CM

## 2012-03-01 ENCOUNTER — Other Ambulatory Visit: Payer: Medicare Other

## 2012-03-02 ENCOUNTER — Ambulatory Visit
Admission: RE | Admit: 2012-03-02 | Discharge: 2012-03-02 | Disposition: A | Discharge status: Home / Self Care | Payer: Medicare Other | Source: Ambulatory Visit | Attending: Internal Medicine | Admitting: Internal Medicine

## 2012-03-02 DIAGNOSIS — R0602 Shortness of breath: Secondary | ICD-10-CM

## 2012-03-02 MED ORDER — IOHEXOL 350 MG/ML SOLN
100.0000 mL | Freq: Once | INTRAVENOUS | Status: AC | PRN
  Administered 2012-03-02: 80 mL via INTRAVENOUS

## 2012-03-15 ENCOUNTER — Inpatient Hospital Stay (HOSPITAL_BASED_OUTPATIENT_CLINIC_OR_DEPARTMENT_OTHER)
Admission: RE | Admit: 2012-03-15 | Discharge: 2012-03-15 | Disposition: A | Discharge status: Home / Self Care | Payer: Medicare Other | Source: Ambulatory Visit | Attending: Cardiology | Admitting: Cardiology

## 2012-03-15 DIAGNOSIS — R079 Chest pain, unspecified: Secondary | ICD-10-CM | POA: Insufficient documentation

## 2012-03-15 DIAGNOSIS — Z538 Procedure and treatment not carried out for other reasons: Secondary | ICD-10-CM | POA: Insufficient documentation

## 2012-03-15 LAB — CBC WITH DIFFERENTIAL/PLATELET
Basophils Absolute: 0.1 10*3/uL (ref 0.0–0.1)
Eosinophils Relative: 3 % (ref 0–5)
HCT: 34 % — ABNORMAL LOW (ref 36.0–46.0)
Lymphocytes Relative: 39 % (ref 12–46)
Lymphs Abs: 2.5 10*3/uL (ref 0.7–4.0)
MCV: 84.4 fL (ref 78.0–100.0)
Monocytes Absolute: 0.6 10*3/uL (ref 0.1–1.0)
RDW: 15.9 % — ABNORMAL HIGH (ref 11.5–15.5)
WBC: 6.5 10*3/uL (ref 4.0–10.5)

## 2012-03-15 LAB — BASIC METABOLIC PANEL
CO2: 26 mEq/L (ref 19–32)
Chloride: 106 mEq/L (ref 96–112)
Creatinine, Ser: 1.78 mg/dL — ABNORMAL HIGH (ref 0.50–1.10)
Glucose, Bld: 104 mg/dL — ABNORMAL HIGH (ref 70–99)

## 2012-03-15 MED ORDER — SODIUM CHLORIDE 0.9 % IJ SOLN: 3.0000 mL | INTRAMUSCULAR | Status: DC | PRN

## 2012-03-15 MED ORDER — SODIUM CHLORIDE 0.9 % IV SOLN: 250.0000 mL | INTRAVENOUS | Status: DC | PRN

## 2012-03-15 MED ORDER — DIAZEPAM 5 MG PO TABS
5.0000 mg | ORAL_TABLET | ORAL | Status: AC
  Administered 2012-03-15: 5 mg via ORAL

## 2012-03-15 MED ORDER — SODIUM CHLORIDE 0.9 % IJ SOLN: 3.0000 mL | Freq: Two times a day (BID) | INTRAMUSCULAR | Status: DC

## 2012-03-15 MED ORDER — ASPIRIN 81 MG PO CHEW: 324.0000 mg | CHEWABLE_TABLET | ORAL | Status: DC

## 2012-03-15 MED ORDER — SODIUM CHLORIDE 0.9 % IV SOLN: INTRAVENOUS | Status: DC

## 2012-03-15 NOTE — H&P (Signed)
H&P in the chart needs to be scanned 

## 2012-03-15 NOTE — OR Nursing (Signed)
IV access attempted 4 times myself and 3 times by IV team, Dr Sharyn Lull notified

## 2012-03-17 ENCOUNTER — Encounter (HOSPITAL_BASED_OUTPATIENT_CLINIC_OR_DEPARTMENT_OTHER)
Admission: RE | Disposition: A | Discharge status: Home / Self Care | Payer: Self-pay | Source: Ambulatory Visit | Attending: Cardiology

## 2012-03-17 ENCOUNTER — Inpatient Hospital Stay (HOSPITAL_BASED_OUTPATIENT_CLINIC_OR_DEPARTMENT_OTHER)
Admission: RE | Admit: 2012-03-17 | Discharge: 2012-03-17 | Disposition: A | Discharge status: Home / Self Care | Payer: Medicare Other | Source: Ambulatory Visit | Attending: Cardiology | Admitting: Cardiology

## 2012-03-17 DIAGNOSIS — E119 Type 2 diabetes mellitus without complications: Secondary | ICD-10-CM | POA: Insufficient documentation

## 2012-03-17 DIAGNOSIS — F329 Major depressive disorder, single episode, unspecified: Secondary | ICD-10-CM | POA: Insufficient documentation

## 2012-03-17 DIAGNOSIS — I2510 Atherosclerotic heart disease of native coronary artery without angina pectoris: Secondary | ICD-10-CM | POA: Insufficient documentation

## 2012-03-17 DIAGNOSIS — I129 Hypertensive chronic kidney disease with stage 1 through stage 4 chronic kidney disease, or unspecified chronic kidney disease: Secondary | ICD-10-CM | POA: Insufficient documentation

## 2012-03-17 DIAGNOSIS — R0989 Other specified symptoms and signs involving the circulatory and respiratory systems: Secondary | ICD-10-CM | POA: Insufficient documentation

## 2012-03-17 DIAGNOSIS — R0609 Other forms of dyspnea: Secondary | ICD-10-CM | POA: Insufficient documentation

## 2012-03-17 DIAGNOSIS — K219 Gastro-esophageal reflux disease without esophagitis: Secondary | ICD-10-CM | POA: Insufficient documentation

## 2012-03-17 DIAGNOSIS — E7800 Pure hypercholesterolemia, unspecified: Secondary | ICD-10-CM | POA: Insufficient documentation

## 2012-03-17 DIAGNOSIS — I69959 Hemiplegia and hemiparesis following unspecified cerebrovascular disease affecting unspecified side: Secondary | ICD-10-CM | POA: Insufficient documentation

## 2012-03-17 DIAGNOSIS — F3289 Other specified depressive episodes: Secondary | ICD-10-CM | POA: Insufficient documentation

## 2012-03-17 DIAGNOSIS — N189 Chronic kidney disease, unspecified: Secondary | ICD-10-CM | POA: Insufficient documentation

## 2012-03-17 SURGERY — JV LEFT HEART CATHETERIZATION WITH CORONARY ANGIOGRAM
Anesthesia: Moderate Sedation

## 2012-03-17 MED ORDER — ONDANSETRON HCL 4 MG/2ML IJ SOLN: 4.0000 mg | Freq: Four times a day (QID) | INTRAMUSCULAR | Status: DC | PRN

## 2012-03-17 MED ORDER — SODIUM CHLORIDE 0.9 % IV SOLN: 250.0000 mL | INTRAVENOUS | Status: DC | PRN

## 2012-03-17 MED ORDER — SODIUM CHLORIDE 0.9 % IV SOLN: INTRAVENOUS | Status: AC

## 2012-03-17 MED ORDER — ACETAMINOPHEN 325 MG PO TABS: 650.0000 mg | ORAL_TABLET | ORAL | Status: DC | PRN

## 2012-03-17 MED ORDER — SODIUM BICARBONATE BOLUS VIA INFUSION
INTRAVENOUS | Status: AC
  Administered 2012-03-17: 10:00:00 via INTRAVENOUS
  Filled 2012-03-17: qty 1

## 2012-03-17 MED ORDER — SODIUM CHLORIDE 0.9 % IJ SOLN: 3.0000 mL | Freq: Two times a day (BID) | INTRAMUSCULAR | Status: DC

## 2012-03-17 MED ORDER — ASPIRIN 81 MG PO CHEW
324.0000 mg | CHEWABLE_TABLET | ORAL | Status: AC
  Administered 2012-03-17: 324 mg via ORAL

## 2012-03-17 MED ORDER — SODIUM BICARBONATE 8.4 % IV SOLN
INTRAVENOUS | Status: DC
  Filled 2012-03-17: qty 1000

## 2012-03-17 MED ORDER — SODIUM CHLORIDE 0.9 % IJ SOLN: 10.0000 mL | INTRAMUSCULAR | Status: DC | PRN

## 2012-03-17 MED ORDER — DIAZEPAM 5 MG PO TABS
5.0000 mg | ORAL_TABLET | ORAL | Status: AC
  Administered 2012-03-17: 5 mg via ORAL

## 2012-03-17 MED ORDER — SODIUM CHLORIDE 0.9 % IJ SOLN: 3.0000 mL | INTRAMUSCULAR | Status: DC | PRN

## 2012-03-17 MED ORDER — SODIUM CHLORIDE 0.9 % IV SOLN: INTRAVENOUS | Status: DC

## 2012-03-17 NOTE — Progress Notes (Signed)
Bicarbonate drip and normal saline drips d/c'd..  IV therapy in to d/c [facility] line

## 2012-03-17 NOTE — H&P (Signed)
  Handwritten H&P in the chart needs to be scanned. 

## 2012-03-17 NOTE — Progress Notes (Signed)
Bedrest begins @ 1210, tegaderm dressing applied to right groin site by Army Melia, site level 0.

## 2012-03-17 NOTE — CV Procedure (Signed)
Left cardiac cath report dictated on 03/17/2012 dictation number is [id number]

## 2012-03-17 NOTE — Progress Notes (Signed)
Peripherally Inserted Central Catheter/Midline Placement  The IV Nurse has discussed with the patient and/or persons authorized to consent for the patient, the purpose of this procedure and the potential benefits and risks involved with this procedure.  The benefits include less needle sticks, lab draws from the catheter and patient may be discharged home with the catheter.  Risks include, but not limited to, infection, bleeding, blood clot (thrombus formation), and puncture of an artery; nerve damage and irregular heat beat.  Alternatives to this procedure were also discussed.  PICC/Midline Placement Documentation        [name] 03/17/2012, 8:54 AM

## 2012-03-18 NOTE — Cardiovascular Report (Signed)
[name], [name]                   ACCOUNT NO.:  [id number]  MEDICAL RECORD NO.:  [id number]  LOCATION:                                 FACILITY:  PHYSICIAN:  Leylani Duley N. Sharyn Lull, M.D.      DATE OF BIRTH:  DATE OF PROCEDURE:  03/17/2012 DATE OF DISCHARGE:                           CARDIAC CATHETERIZATION   PROCEDURE:  Left cardiac cath with selective left and right coronary angiography, measurement of LVEDP via right groin using Judkins technique.  INDICATION FOR THE PROCEDURE:  [name] is a 67 year old female with past medical history significant for hypertension, non-insulin-dependent diabetes mellitus, history of CVA with right paresis, hypercholesteremia, and GERD, depression, chronic kidney disease, complains of exertional dyspnea with minimal exertion associated with palpitation off and on for last few weeks.  The patient denies any chest pain, nausea, vomiting, diaphoresis.  Although, her activity is limited. Denies PND, orthopnea, leg swelling.  EKG done in my office showed normal sinus rhythm with septal Q-waves and anterolateral minor ST-T wave changes.  The patient denies any recent cardiac workup.  Due to exertional dyspnea, abnormal EKG, multiple risk factors, discussed with the patient and her husband at length regarding various options of treatment, i.e., noninvasive stress testing versus left cath, its risks and benefits, i.e., death, MI, stroke, need for emergency CABG, local vascular complications, etc., and consented for left catheterization.  PROCEDURE IN DETAIL:  After obtaining the informed consent, the patient was brought to the cath lab and was placed on fluoroscopy table.  Right groin was prepped and draped in the usual fashion.  The patient was well hydrated and was started on bicarb drip prior to the left cardiac cath. With the help of thin wall needle, 4-French arterial sheath was placed. The sheath was aspirated and flushed.  A 4-French left Judkins  catheter was advanced over the wire under fluoroscopic guidance up to the ascending aorta, wire was pulled out. The catheter was aspirated and connected to the Manifold.  Catheter was further advanced and engaged into left coronary ostium.  Multiple views of the left system were taken.  Next, catheter was disengaged and was pulled out over the wire and was replaced with 4-French 3D right diagnostic catheter which was advanced over the wire under fluoroscopic guidance up to the ascending aorta.  Wire was pulled out.  The catheter was aspirated and connected to the Manifold.  Catheter was further advanced and engaged into right coronary ostium.  Multiple views of the right system were taken.  Next, catheter was disengaged and was pulled out over the wire and was replaced.  A 4-French pigtail catheter which was advanced over the wire under fluoroscopic guidance up to the ascending aorta.  Wire was pulled out.  The catheter was aspirated and connected to the Manifold.  Catheter was further advanced across the aortic valve into the LV.  LV pressures were recorded.  LV graft was not done, just LV pressures were measured.  Her LVEDP was 17 mmHg.  The pigtail catheter was pulled out.  There was no gradient across the aortic valve.  The pigtail catheter was pulled out.  Sheaths were aspirated and flushed.  FINDINGS:  LVEDP was 17 mmHg.  Left main has 15% to 20% distal stenosis. LAD has 30% ostial stenosis.  Diagonal 1 and 2 were very small.  Ramus was very small which was patent.  Left circumflex was patent.  OM-1 was small which was patent.  OM-2 and 3 were very small.  RCA has 15% to 20% proximal and 30% to 40% focal napkin ring eccentric mid stenosis, PDA and PLV branches were small which were patent.  The patient tolerated procedure well.  There were no complications.  The patient was transferred to recovery room in stable condition.     Eduardo Osier. Sharyn Lull, M.D.     MNH/MEDQ  D:   03/17/2012  T:  03/18/2012  Job:  [id number]

## 2012-03-21 LAB — POCT I-STAT GLUCOSE
Glucose, Bld: 113 mg/dL — ABNORMAL HIGH (ref 70–99)
Operator id: 144271

## 2012-04-04 ENCOUNTER — Encounter (HOSPITAL_COMMUNITY): Payer: Self-pay | Admitting: *Deleted

## 2012-04-04 ENCOUNTER — Emergency Department (HOSPITAL_COMMUNITY): Payer: Medicare Other

## 2012-04-04 ENCOUNTER — Inpatient Hospital Stay (HOSPITAL_COMMUNITY)
Admission: EM | Admit: 2012-04-04 | Discharge: 2012-04-07 | DRG: 247 | Disposition: A | Discharge status: Home / Self Care | Payer: Medicare Other | Attending: Cardiology | Admitting: Cardiology

## 2012-04-04 DIAGNOSIS — I214 Non-ST elevation (NSTEMI) myocardial infarction: Principal | ICD-10-CM | POA: Diagnosis present

## 2012-04-04 DIAGNOSIS — F329 Major depressive disorder, single episode, unspecified: Secondary | ICD-10-CM | POA: Diagnosis present

## 2012-04-04 DIAGNOSIS — I2584 Coronary atherosclerosis due to calcified coronary lesion: Secondary | ICD-10-CM | POA: Diagnosis present

## 2012-04-04 DIAGNOSIS — Z8673 Personal history of transient ischemic attack (TIA), and cerebral infarction without residual deficits: Secondary | ICD-10-CM

## 2012-04-04 DIAGNOSIS — F3289 Other specified depressive episodes: Secondary | ICD-10-CM | POA: Diagnosis present

## 2012-04-04 DIAGNOSIS — K219 Gastro-esophageal reflux disease without esophagitis: Secondary | ICD-10-CM | POA: Diagnosis present

## 2012-04-04 DIAGNOSIS — I2510 Atherosclerotic heart disease of native coronary artery without angina pectoris: Secondary | ICD-10-CM | POA: Diagnosis present

## 2012-04-04 DIAGNOSIS — Z87891 Personal history of nicotine dependence: Secondary | ICD-10-CM

## 2012-04-04 DIAGNOSIS — D62 Acute posthemorrhagic anemia: Secondary | ICD-10-CM | POA: Diagnosis not present

## 2012-04-04 DIAGNOSIS — E119 Type 2 diabetes mellitus without complications: Secondary | ICD-10-CM | POA: Diagnosis present

## 2012-04-04 DIAGNOSIS — D6489 Other specified anemias: Secondary | ICD-10-CM | POA: Diagnosis present

## 2012-04-04 DIAGNOSIS — N189 Chronic kidney disease, unspecified: Secondary | ICD-10-CM | POA: Diagnosis present

## 2012-04-04 DIAGNOSIS — E7800 Pure hypercholesterolemia, unspecified: Secondary | ICD-10-CM | POA: Diagnosis present

## 2012-04-04 DIAGNOSIS — I129 Hypertensive chronic kidney disease with stage 1 through stage 4 chronic kidney disease, or unspecified chronic kidney disease: Secondary | ICD-10-CM | POA: Diagnosis present

## 2012-04-04 HISTORY — DX: Atherosclerotic heart disease of native coronary artery without angina pectoris: I25.10

## 2012-04-04 HISTORY — DX: Cerebral infarction, unspecified: I63.9

## 2012-04-04 LAB — D-DIMER, QUANTITATIVE: D-Dimer, Quant: 1.2 ug/mL-FEU — ABNORMAL HIGH (ref 0.00–0.48)

## 2012-04-04 LAB — CBC
HCT: 32.9 % — ABNORMAL LOW (ref 36.0–46.0)
Hemoglobin: 10.6 g/dL — ABNORMAL LOW (ref 12.0–15.0)
RDW: 15.4 % (ref 11.5–15.5)
WBC: 12.4 10*3/uL — ABNORMAL HIGH (ref 4.0–10.5)

## 2012-04-04 LAB — LIPASE, BLOOD: Lipase: 29 U/L (ref 11–59)

## 2012-04-04 LAB — POCT I-STAT TROPONIN I: Troponin i, poc: 22.85 ng/mL (ref 0.00–0.08)

## 2012-04-04 LAB — HEPATIC FUNCTION PANEL
ALT: 12 U/L (ref 0–35)
Bilirubin, Direct: <0.1 mg/dL (ref 0.0–0.3)
Total Protein: 8.2 g/dL (ref 6.0–8.3)

## 2012-04-04 LAB — POCT I-STAT, CHEM 8
Chloride: 108 mEq/L (ref 96–112)
Glucose, Bld: 146 mg/dL — ABNORMAL HIGH (ref 70–99)
HCT: 35 % — ABNORMAL LOW (ref 36.0–46.0)
Potassium: 5 mEq/L (ref 3.5–5.1)
Sodium: 142 mEq/L (ref 135–145)

## 2012-04-04 LAB — BASIC METABOLIC PANEL
Chloride: 105 mEq/L (ref 96–112)
GFR calc Af Amer: 31 mL/min — ABNORMAL LOW (ref >90)
GFR calc non Af Amer: 27 mL/min — ABNORMAL LOW (ref >90)
Potassium: 4.5 mEq/L (ref 3.5–5.1)
Sodium: 139 mEq/L (ref 135–145)

## 2012-04-04 MED ORDER — ASPIRIN 325 MG PO TABS
325.0000 mg | ORAL_TABLET | Freq: Every day | ORAL | Status: DC
  Administered 2012-04-04: 325 mg via ORAL
  Filled 2012-04-04 (×2): qty 1

## 2012-04-04 MED ORDER — CLOPIDOGREL BISULFATE 300 MG PO TABS
300.0000 mg | ORAL_TABLET | Freq: Once | ORAL | Status: AC
  Administered 2012-04-04: 300 mg via ORAL
  Filled 2012-04-04: qty 1

## 2012-04-04 MED ORDER — SODIUM CHLORIDE 0.9 % IV SOLN
INTRAVENOUS | Status: DC
  Administered 2012-04-04: 125 mL/h via INTRAVENOUS

## 2012-04-04 MED ORDER — HEPARIN (PORCINE) IN NACL 100-0.45 UNIT/ML-% IJ SOLN
1000.0000 [IU]/h | INTRAMUSCULAR | Status: DC
  Administered 2012-04-04: 1000 [IU]/h via INTRAVENOUS
  Filled 2012-04-04: qty 250

## 2012-04-04 MED ORDER — MORPHINE SULFATE 4 MG/ML IJ SOLN
4.0000 mg | Freq: Once | INTRAMUSCULAR | Status: AC
  Administered 2012-04-04: 4 mg via INTRAVENOUS
  Filled 2012-04-04: qty 1

## 2012-04-04 MED ORDER — HEPARIN BOLUS VIA INFUSION
3500.0000 [IU] | Freq: Once | INTRAVENOUS | Status: AC
  Administered 2012-04-04: 3500 [IU] via INTRAVENOUS

## 2012-04-04 MED ORDER — ONDANSETRON HCL 4 MG/2ML IJ SOLN
4.0000 mg | Freq: Once | INTRAMUSCULAR | Status: AC
  Administered 2012-04-04: 4 mg via INTRAVENOUS
  Filled 2012-04-04: qty 2

## 2012-04-04 MED ORDER — NITROGLYCERIN IN D5W 200-5 MCG/ML-% IV SOLN
2.0000 ug/min | Freq: Once | INTRAVENOUS | Status: AC
  Administered 2012-04-04: 10 ug/min via INTRAVENOUS
  Filled 2012-04-04: qty 250

## 2012-04-04 MED ORDER — HEPARIN BOLUS VIA INFUSION: 4000.0000 [IU] | Freq: Once | INTRAVENOUS | Status: DC

## 2012-04-04 MED ORDER — IOHEXOL 350 MG/ML SOLN
80.0000 mL | Freq: Once | INTRAVENOUS | Status: AC | PRN
  Administered 2012-04-04: 80 mL via INTRAVENOUS

## 2012-04-04 NOTE — ED Notes (Signed)
Pt with acute onset sternal chest pain and diaphoresis at 1445.  Describes pain as heaviness and difficulty speaking d/t sob (sats wnl).

## 2012-04-04 NOTE — ED Notes (Signed)
Phlebotomy at bedside to repeat troponin

## 2012-04-04 NOTE — ED Notes (Signed)
Result of Troponin value shown to Dr Manus Gunning.

## 2012-04-04 NOTE — ED Notes (Signed)
Pt states she is feeling nauseous. RN to place order for protocol zofran

## 2012-04-04 NOTE — ED Notes (Signed)
Lab verified with RN critical troponin value. EDP Rancour notified. Pt currently still receiving anit-coagulation therapy.

## 2012-04-04 NOTE — Progress Notes (Signed)
ANTICOAGULATION CONSULT NOTE - Initial Consult  Pharmacy Consult for UFH Indication: NSTEMI  No Known Allergies  Patient Measurements: Height: 5\' 4"  (162.6 cm) Weight: 145 lb (65.772 kg) IBW/kg (Calculated) : 54.7 Heparin Dosing Weight: 66kg  Vital Signs: Temp: 98.3 F (36.8 C) (03/31 1519) Temp src: Oral (03/31 1519) BP: 141/76 mmHg (03/31 2015) Pulse Rate: 77 (03/31 2015)  Labs:  Recent Labs  04/04/12 1523 04/04/12 1556 04/04/12 1815  HGB 10.6* 11.9*  --   HCT 32.9* 35.0*  --   PLT 465*  --   --   CREATININE  --  1.80* 1.85*    Estimated Creatinine Clearance: 27.5 ml/min (by C-G formula based on Cr of 1.85).   Medical History: Past Medical History  Diagnosis Date  . Diabetes mellitus without complication   . Coronary artery disease   . Stroke 2005    R sided deficits  . Asthma     Medications:   (Not in a hospital admission)  Assessment: 67 y/o female patient admitted with chest pain requiring anticoagulation for NSTEMI. EKG wnl. To begin heparin infusion.  Goal of Therapy:  Heparin level 0.3-0.7 units/ml Monitor platelets by anticoagulation protocol: Yes   Plan:  Heparin 3500 unit IV bolus followed by infusion at 1000 units/hr. Check 6 hour heparin level with daily cbc and heparin level.  Verlene Mayer, PharmD, BCPS Pager [phone] 04/04/2012,10:08 PM

## 2012-04-04 NOTE — H&P (Signed)
[name] is an 67 y.o. female.   Chief Complaint: Chest pain HPI: Patient is 67 year old female with past medical history significant for mild-to-moderate coronary artery disease recently had cardiac cath approximately 3 weeks ago, hypertension, non-insulin-dependent diabetes mellitus, history of CVA with right paresis, hypercholesteremia, GERD, depression, chronic kidney disease, came to the ER complaining of retrosternal chest pain radiating to back associated with mild shortness of breath since earlier this afternoon off and on. Patient denies any nausea vomiting diaphoresis. Denies any palpitation lightheadedness or syncope. Initial EKG done in the ER showed normal sinus rhythm with poor R-wave progression in anteroseptal leads and repeat EKG showed normal sinus rhythm with poor R. progression in anteroseptal leads and marked ST-T wave changes in inferolateral leads and was noted to have markedly elevated troponin I. of 22. Patient also was noted to have mildly elevated d-dimer subsequently had spiral CT of the chest which was negative for pulmonary embolism. Patient presently denies any chest pain patient was started on IV heparin and nitroglycerin with relief of chest pain.  Past Medical History  Diagnosis Date  . Diabetes mellitus without complication   . Coronary artery disease   . Stroke 2005    R sided deficits  . Asthma     Past Surgical History  Procedure Laterality Date  . Tubal ligation      No family history on file. Social History:  reports that she has quit smoking. She does not have any smokeless tobacco history on file. She reports that she does not drink alcohol or use illicit drugs.  Allergies: No Known Allergies   (Not in a hospital admission)  Results for orders placed during the hospital encounter of 04/04/12 (from the past 48 hour(s))  CBC     Status: Abnormal   Collection Time    04/04/12  3:23 PM      Result Value Range   WBC 12.4 (*) 4.0 - 10.5 K/uL   RBC  3.98  3.87 - 5.11 MIL/uL   Hemoglobin 10.6 (*) 12.0 - 15.0 g/dL   HCT 81.1 (*) 91.4 - 78.2 %   MCV 82.7  78.0 - 100.0 fL   MCH 26.6  26.0 - 34.0 pg   MCHC 32.2  30.0 - 36.0 g/dL   RDW 95.6  21.3 - 08.6 %   Platelets 465 (*) 150 - 400 K/uL  POCT I-STAT TROPONIN I     Status: None   Collection Time    04/04/12  3:55 PM      Result Value Range   Troponin i, poc 0.01  0.00 - 0.08 ng/mL   Comment 3            Comment: Due to the release kinetics of cTnI,     a negative result within the first hours     of the onset of symptoms does not rule out     myocardial infarction with certainty.     If myocardial infarction is still suspected,     repeat the test at appropriate intervals.  POCT I-STAT, CHEM 8     Status: Abnormal   Collection Time    04/04/12  3:56 PM      Result Value Range   Sodium 142  135 - 145 mEq/L   Potassium 5.0  3.5 - 5.1 mEq/L   Chloride 108  96 - 112 mEq/L   BUN 20  6 - 23 mg/dL   Creatinine, Ser 5.78 (*) 0.50 - 1.10 mg/dL  Glucose, Bld 146 (*) 70 - 99 mg/dL   Calcium, Ion 1.30  8.65 - 1.30 mmol/L   TCO2 24  0 - 100 mmol/L   Hemoglobin 11.9 (*) 12.0 - 15.0 g/dL   HCT 78.4 (*) 69.6 - 29.5 %  LIPASE, BLOOD     Status: None   Collection Time    04/04/12  6:15 PM      Result Value Range   Lipase 29  11 - 59 U/L  HEPATIC FUNCTION PANEL     Status: Abnormal   Collection Time    04/04/12  6:15 PM      Result Value Range   Total Protein 8.2  6.0 - 8.3 g/dL   Albumin 3.6  3.5 - 5.2 g/dL   AST 58 (*) 0 - 37 U/L   ALT 12  0 - 35 U/L   Alkaline Phosphatase 120 (*) 39 - 117 U/L   Total Bilirubin 0.2 (*) 0.3 - 1.2 mg/dL   Bilirubin, Direct <2.8  0.0 - 0.3 mg/dL   Indirect Bilirubin NOT CALCULATED  0.3 - 0.9 mg/dL  D-DIMER, QUANTITATIVE     Status: Abnormal   Collection Time    04/04/12  6:15 PM      Result Value Range   D-Dimer, Quant 1.20 (*) 0.00 - 0.48 ug/mL-FEU   Comment:            AT THE INHOUSE ESTABLISHED CUTOFF     VALUE OF 0.48 ug/mL FEU,     THIS  ASSAY HAS BEEN DOCUMENTED     IN THE LITERATURE TO HAVE     A SENSITIVITY AND NEGATIVE     PREDICTIVE VALUE OF AT LEAST     98 TO 99%.  THE TEST RESULT     SHOULD BE CORRELATED WITH     AN ASSESSMENT OF THE CLINICAL     PROBABILITY OF DVT / VTE.  BASIC METABOLIC PANEL     Status: Abnormal   Collection Time    04/04/12  6:15 PM      Result Value Range   Sodium 139  135 - 145 mEq/L   Potassium 4.5  3.5 - 5.1 mEq/L   Chloride 105  96 - 112 mEq/L   CO2 24  19 - 32 mEq/L   Glucose, Bld 130 (*) 70 - 99 mg/dL   BUN 18  6 - 23 mg/dL   Creatinine, Ser 4.13 (*) 0.50 - 1.10 mg/dL   Calcium 9.3  8.4 - 24.4 mg/dL   GFR calc non Af Amer 27 (*) >90 mL/min   GFR calc Af Amer 31 (*) >90 mL/min   Comment:            The eGFR has been calculated     using the CKD EPI equation.     This calculation has not been     validated in all clinical     situations.     eGFR's persistently     <90 mL/min signify     possible Chronic Kidney Disease.  POCT I-STAT TROPONIN I     Status: Abnormal   Collection Time    04/04/12  9:36 PM      Result Value Range   Troponin i, poc 22.85 (*) 0.00 - 0.08 ng/mL   Comment NOTIFIED PHYSICIAN     Comment 3            Comment: Due to the release kinetics of cTnI,     a negative  result within the first hours     of the onset of symptoms does not rule out     myocardial infarction with certainty.     If myocardial infarction is still suspected,     repeat the test at appropriate intervals.   Dg Chest 2 View  04/04/2012  *RADIOLOGY REPORT*  Clinical Data: Chest pain  CHEST - 2 VIEW  Comparison: March 02, 2011.  Findings: Stable mild cardiomegaly.  No acute pulmonary disease is noted.  No pneumothorax or pleural effusion is noted.  Bony thorax is intact.  IMPRESSION: No acute cardiopulmonary abnormality seen.   Original Report Authenticated By: Lupita Raider.,  M.D.    Ct Angio Chest Pe W/cm &/or Wo Cm  04/04/2012  *RADIOLOGY REPORT*  Clinical Data: Short of  breath and chest pain  CT ANGIOGRAPHY CHEST  Technique:  Multidetector CT imaging of the chest using the standard protocol during bolus administration of intravenous contrast. Multiplanar reconstructed images including MIPs were obtained and reviewed to evaluate the vascular anatomy.  Contrast: 80mL OMNIPAQUE IOHEXOL 350 MG/ML SOLN  Comparison: 03/02/2012  Findings: There are no filling defects in the pulmonary arterial tree to suggest acute pulmonary thromboembolism.  Negative abnormal mediastinal adenopathy.  Coronary artery calcifications in the left main, left anterior descending, circumflex, and right coronary artery.  No pericardial effusion.  No pneumothorax and no pleural effusion.  Dependent atelectasis in both lungs.  Patchy ground-glass in the upper lobes likely represents volume loss.  Chronic sternal fracture.  It is stable.  IMPRESSION: No evidence of acute pulmonary thromboembolism.  Chronic changes.   Original Report Authenticated By: Jolaine Click, M.D.     Review of Systems  Constitutional: Negative for fever, chills and weight loss.  Eyes: Negative for blurred vision and double vision.  Respiratory: Negative for cough, hemoptysis and sputum production.   Cardiovascular: Positive for chest pain. Negative for palpitations, orthopnea, claudication and leg swelling.  Gastrointestinal: Negative for nausea, vomiting and abdominal pain.  Neurological: Negative for dizziness and headaches.    Blood pressure 130/75, pulse 86, temperature 98.3 F (36.8 C), temperature source Oral, resp. rate 19, height 5\' 4"  (1.626 m), weight 65.772 kg (145 lb), SpO2 96.00%. Physical Exam  Constitutional: She is oriented to person, place, and time.  HENT:  Head: Atraumatic.  Eyes: Conjunctivae are normal. Pupils are equal, round, and reactive to light. Left eye exhibits no discharge.  Neck: Normal range of motion. Neck supple. No JVD present. No tracheal deviation present. No thyromegaly present.   Cardiovascular: Normal rate and regular rhythm.   Murmur (Soft systolic murmur and S4 gallop noted) heard. Respiratory: Effort normal and breath sounds normal. No respiratory distress. She has no wheezes. She has no rales.  GI: Soft. Bowel sounds are normal. She exhibits no distension. There is no tenderness. There is no rebound.  Musculoskeletal: She exhibits no edema and no tenderness.  Neurological: She is alert and oriented to person, place, and time.  Right paresis as before     Assessment/Plan Acute non-Q-wave myocardial infarction Coronary artery disease Hypertension Non-insulin-dependent diabetes mellitus History of left CVA with right paresis Hypercholesteremia GERD Depression Chronic kidney disease Plan As per orders Discussed with patient regarding left eye possible PTCA stenting its risk and benefits i.e. death MI stroke need for emergency CABG risk of restenosis local vascular complications etc. and consents for PCI. We'll discuss with patient's family and her husband in the morning  Venia Riveron N 04/04/2012, 10:44 PM

## 2012-04-04 NOTE — ED Notes (Signed)
Pt family updated on why pt is having a CT angio

## 2012-04-04 NOTE — ED Provider Notes (Signed)
History     CSN: [id number]  Arrival date & time 04/04/12  1508   First MD Initiated Contact with Patient 04/04/12 1744      Chief Complaint  Patient presents with  . Chest Pain    (Consider location/radiation/quality/duration/timing/severity/associated sxs/prior treatment) HPI Comments: Pt presents to the ED for exertional chest pain while climbing stairs earlier this afternoon.  Pt rested and drank cold water without resolution of sx.  Pt was recently referred to cardiology, Dr. Sharyn Lull, with catherization on 3/13 revealing mild blockages of left main, LDA, RCA- all with acceptable patency.  Was started on plavix and lipitor which she has been taking as directed.  Denies any SOB, vomiting, diarrhea, or dysuria.  Hx of DM, CAD, and asthma.  Stroke in 2005 with residual neuro deficits.    The history is provided by the patient.    Past Medical History  Diagnosis Date  . Diabetes mellitus without complication   . Coronary artery disease   . Stroke 2005    R sided deficits    Past Surgical History  Procedure Laterality Date  . Tubal ligation      No family history on file.  History  Substance Use Topics  . Smoking status: Former Games developer  . Smokeless tobacco: Not on file  . Alcohol Use: No    OB History   Grav Para Term Preterm Abortions TAB SAB Ect Mult Living                  Review of Systems  Cardiovascular: Positive for chest pain.  Gastrointestinal: Positive for abdominal pain.  All other systems reviewed and are negative.    Allergies  Review of patient's allergies indicates no known allergies.  Home Medications  No current outpatient prescriptions on file.  BP 141/88  Pulse 74  Temp(Src) 98.3 F (36.8 C) (Oral)  Resp 20  Ht 5\' 4"  (1.626 m)  Wt 145 lb (65.772 kg)  BMI 24.88 kg/m2  SpO2 98%  Physical Exam  Nursing note and vitals reviewed. Constitutional: She is oriented to person, place, and time. She appears well-developed and  well-nourished.  HENT:  Head: Normocephalic and atraumatic.  Mouth/Throat: Oropharynx is clear and moist. No oropharyngeal exudate.  Eyes: Conjunctivae and EOM are normal. Pupils are equal, round, and reactive to light.  Neck: Normal range of motion. Neck supple.  Cardiovascular: Normal rate, regular rhythm and normal heart sounds.   Pulmonary/Chest: Effort normal and breath sounds normal. No respiratory distress. She has no wheezes.  Chest pain not reproducible with palpation  Abdominal: Soft. Bowel sounds are normal. There is tenderness in the epigastric area. There is no CVA tenderness, no tenderness at McBurney's point and negative Murphy's sign.  Musculoskeletal: Normal range of motion. She exhibits no edema.  Neurological: She is alert and oriented to person, place, and time.  R sided neuro deficits from prior stroke  Skin: Skin is warm and dry.  Psychiatric: She has a normal mood and affect.    ED Course  Procedures (including critical care time)   Date: 04/04/2012  Rate: 75  Rhythm: normal sinus rhythm  QRS Axis: normal  Intervals: normal  ST/T Wave abnormalities: normal  Conduction Disutrbances:none  Narrative Interpretation:   Old EKG Reviewed: unchanged   Repeat  Date: 04/04/2012  Rate: 80  Rhythm: normal sinus rhythm  QRS Axis: normal  Intervals: normal  ST/T Wave abnormalities: nonspecific T wave changes  Conduction Disutrbances:none  Narrative Interpretation: flipped t waves inferior and  lateral leads  Old EKG Reviewed: changes noted     Labs Reviewed  CBC - Abnormal; Notable for the following:    WBC 12.4 (*)    Hemoglobin 10.6 (*)    HCT 32.9 (*)    Platelets 465 (*)    All other components within normal limits  HEPATIC FUNCTION PANEL - Abnormal; Notable for the following:    AST 58 (*)    Alkaline Phosphatase 120 (*)    Total Bilirubin 0.2 (*)    All other components within normal limits  D-DIMER, QUANTITATIVE - Abnormal; Notable for the  following:    D-Dimer, Quant 1.20 (*)    All other components within normal limits  BASIC METABOLIC PANEL - Abnormal; Notable for the following:    Glucose, Bld 130 (*)    Creatinine, Ser 1.85 (*)    GFR calc non Af Amer 27 (*)    GFR calc Af Amer 31 (*)    All other components within normal limits  POCT I-STAT, CHEM 8 - Abnormal; Notable for the following:    Creatinine, Ser 1.80 (*)    Glucose, Bld 146 (*)    Hemoglobin 11.9 (*)    HCT 35.0 (*)    All other components within normal limits  POCT I-STAT TROPONIN I - Abnormal; Notable for the following:    Troponin i, poc 22.85 (*)    All other components within normal limits  LIPASE, BLOOD  TROPONIN I  HEPARIN LEVEL (UNFRACTIONATED)  CBC  POCT I-STAT TROPONIN I   Dg Chest 2 View  04/04/2012  *RADIOLOGY REPORT*  Clinical Data: Chest pain  CHEST - 2 VIEW  Comparison: March 02, 2011.  Findings: Stable mild cardiomegaly.  No acute pulmonary disease is noted.  No pneumothorax or pleural effusion is noted.  Bony thorax is intact.  IMPRESSION: No acute cardiopulmonary abnormality seen.   Original Report Authenticated By: Lupita Raider.,  M.D.    Ct Angio Chest Pe W/cm &/or Wo Cm  04/04/2012  *RADIOLOGY REPORT*  Clinical Data: Short of breath and chest pain  CT ANGIOGRAPHY CHEST  Technique:  Multidetector CT imaging of the chest using the standard protocol during bolus administration of intravenous contrast. Multiplanar reconstructed images including MIPs were obtained and reviewed to evaluate the vascular anatomy.  Contrast: 80mL OMNIPAQUE IOHEXOL 350 MG/ML SOLN  Comparison: 03/02/2012  Findings: There are no filling defects in the pulmonary arterial tree to suggest acute pulmonary thromboembolism.  Negative abnormal mediastinal adenopathy.  Coronary artery calcifications in the left main, left anterior descending, circumflex, and right coronary artery.  No pericardial effusion.  No pneumothorax and no pleural effusion.  Dependent  atelectasis in both lungs.  Patchy ground-glass in the upper lobes likely represents volume loss.  Chronic sternal fracture.  It is stable.  IMPRESSION: No evidence of acute pulmonary thromboembolism.  Chronic changes.   Original Report Authenticated By: Jolaine Click, M.D.      1. NSTEMI (non-ST elevated myocardial infarction)     CRITICAL CARE Performed by: Garlon Hatchet   Total critical care time: 30  Critical care time was exclusive of separately billable procedures and treating other patients.  Critical care was necessary to treat or prevent imminent or life-threatening deterioration.  Critical care was time spent personally by me on the following activities: development of treatment plan with patient and/or surrogate as well as nursing, discussions with consultants, evaluation of patient's response to treatment, examination of patient, obtaining history from patient or surrogate, ordering  and performing treatments and interventions, ordering and review of laboratory studies, ordering and review of radiographic studies, pulse oximetry and re-evaluation of patient's condition.  Medications  0.9 %  sodium chloride infusion (125 mL/hr Intravenous New Bag/Given 04/04/12 1840)  aspirin tablet 325 mg (325 mg Oral Given 04/04/12 2215)  heparin ADULT infusion 100 units/mL (25000 units/250 mL) (1,000 Units/hr Intravenous New Bag/Given 04/04/12 2221)  ondansetron (ZOFRAN) injection 4 mg (4 mg Intravenous Given 04/04/12 1845)  morphine 4 MG/ML injection 4 mg (4 mg Intravenous Given 04/04/12 1847)  iohexol (OMNIPAQUE) 350 MG/ML injection 80 mL (80 mLs Intravenous Contrast Given 04/04/12 2048)  nitroGLYCERIN 0.2 mg/mL in dextrose 5 % infusion (15 mcg/min Intravenous Rate/Dose Change 04/04/12 2309)  heparin bolus via infusion 3,500 Units (0 Units Intravenous Stopped 04/04/12 2226)  clopidogrel (PLAVIX) tablet 300 mg (300 mg Oral Given 04/04/12 2333)  ondansetron (ZOFRAN) injection 4 mg (4 mg Intravenous  Given 04/04/12 2334)       MDM    Pt presenting to the ED with exertional CP since earlier this pm while walking up stairs at her sisters house. Pt was recently referred to cardiology, Dr. Sharyn Lull, with catherization on 3/13 revealing mild blockages of left main, LDA, RCA- all with patency.  D-dimer elevated, CT angio negative for PE. Repeat troponin elevated at 22.85 and repeat EKG with newly flipped t-waves in inferior and lateral leads.  Dr. Sharyn Lull consulted.  NSTEMI.  Heparin bolus, heparin drip, and nitro drip started in the ED.  Dr. Sharyn Lull to admit.     Garlon Hatchet, PA-C 04/05/12 0003

## 2012-04-04 NOTE — ED Notes (Signed)
RN Fayrene Fearing started 2nd IV access on pt for elevated troponin values. Pt heparin and nitro infusing now.

## 2012-04-04 NOTE — ED Notes (Signed)
When night shift RN came on to shift a cardiac assessment was completed, but a chest pain assessment was not completed for pt. Oncoming RN completed chest pain assessment.

## 2012-04-04 NOTE — ED Notes (Signed)
Report attempted x1. Nurse unable to take report. ED RN to call back in 5 min.

## 2012-04-04 NOTE — ED Notes (Signed)
Pt updated on wait.  °

## 2012-04-04 NOTE — ED Notes (Signed)
Pt returned from CT, placed back on cardiac monitor

## 2012-04-05 ENCOUNTER — Encounter (HOSPITAL_COMMUNITY)
Admission: EM | Disposition: A | Discharge status: Home / Self Care | Payer: Self-pay | Source: Home / Self Care | Attending: Cardiology

## 2012-04-05 ENCOUNTER — Encounter (HOSPITAL_COMMUNITY): Payer: Self-pay

## 2012-04-05 DIAGNOSIS — I219 Acute myocardial infarction, unspecified: Secondary | ICD-10-CM

## 2012-04-05 HISTORY — PX: PERCUTANEOUS CORONARY STENT INTERVENTION (PCI-S): SHX5485

## 2012-04-05 HISTORY — DX: Acute myocardial infarction, unspecified: I21.9

## 2012-04-05 HISTORY — PX: CORONARY ANGIOPLASTY WITH STENT PLACEMENT: SHX49

## 2012-04-05 HISTORY — PX: CORONARY ANGIOPLASTY: SHX604

## 2012-04-05 HISTORY — PX: LEFT HEART CATHETERIZATION WITH CORONARY ANGIOGRAM: SHX5451

## 2012-04-05 LAB — CBC WITH DIFFERENTIAL/PLATELET
Eosinophils Absolute: 0.1 10*3/uL (ref 0.0–0.7)
Hemoglobin: 9.9 g/dL — ABNORMAL LOW (ref 12.0–15.0)
Lymphocytes Relative: 34 % (ref 12–46)
Lymphs Abs: 3.8 10*3/uL (ref 0.7–4.0)
MCH: 26.5 pg (ref 26.0–34.0)
Monocytes Relative: 7 % (ref 3–12)
Neutro Abs: 6.5 10*3/uL (ref 1.7–7.7)
Neutrophils Relative %: 58 % (ref 43–77)
Platelets: 384 10*3/uL (ref 150–400)
RBC: 3.73 MIL/uL — ABNORMAL LOW (ref 3.87–5.11)
WBC: 11.2 10*3/uL — ABNORMAL HIGH (ref 4.0–10.5)

## 2012-04-05 LAB — TROPONIN I
Troponin I: >20 ng/mL (ref <0.30)
Troponin I: >20 ng/mL (ref <0.30)

## 2012-04-05 LAB — BASIC METABOLIC PANEL
BUN: 16 mg/dL (ref 6–23)
CO2: 22 mEq/L (ref 19–32)
Calcium: 8.3 mg/dL — ABNORMAL LOW (ref 8.4–10.5)
Chloride: 110 mEq/L (ref 96–112)
Creatinine, Ser: 1.66 mg/dL — ABNORMAL HIGH (ref 0.50–1.10)
GFR calc Af Amer: 36 mL/min — ABNORMAL LOW (ref >90)
GFR calc non Af Amer: 31 mL/min — ABNORMAL LOW (ref >90)
Glucose, Bld: 99 mg/dL (ref 70–99)
Potassium: 5.2 mEq/L — ABNORMAL HIGH (ref 3.5–5.1)
Sodium: 140 mEq/L (ref 135–145)

## 2012-04-05 LAB — COMPREHENSIVE METABOLIC PANEL
ALT: 14 U/L (ref 0–35)
AST: 96 U/L — ABNORMAL HIGH (ref 0–37)
Albumin: 3.1 g/dL — ABNORMAL LOW (ref 3.5–5.2)
Alkaline Phosphatase: 102 U/L (ref 39–117)
BUN: 17 mg/dL (ref 6–23)
CO2: 22 mEq/L (ref 19–32)
Calcium: 8.3 mg/dL — ABNORMAL LOW (ref 8.4–10.5)
Chloride: 108 mEq/L (ref 96–112)
Creatinine, Ser: 1.69 mg/dL — ABNORMAL HIGH (ref 0.50–1.10)
GFR calc Af Amer: 35 mL/min — ABNORMAL LOW (ref >90)
GFR calc non Af Amer: 30 mL/min — ABNORMAL LOW (ref >90)
Glucose, Bld: 117 mg/dL — ABNORMAL HIGH (ref 70–99)
Potassium: 5.1 mEq/L (ref 3.5–5.1)
Sodium: 138 mEq/L (ref 135–145)
Total Bilirubin: 0.2 mg/dL — ABNORMAL LOW (ref 0.3–1.2)
Total Protein: 7 g/dL (ref 6.0–8.3)

## 2012-04-05 LAB — CBC
Hemoglobin: 9.8 g/dL — ABNORMAL LOW (ref 12.0–15.0)
MCH: 27 pg (ref 26.0–34.0)
MCHC: 32.9 g/dL (ref 30.0–36.0)
MCV: 82.1 fL (ref 78.0–100.0)
RBC: 3.63 MIL/uL — ABNORMAL LOW (ref 3.87–5.11)
RDW: 15.8 % — ABNORMAL HIGH (ref 11.5–15.5)
WBC: 10.9 10*3/uL — ABNORMAL HIGH (ref 4.0–10.5)

## 2012-04-05 LAB — TSH: TSH: 2.948 u[IU]/mL (ref 0.350–4.500)

## 2012-04-05 LAB — LIPID PANEL
HDL: 37 mg/dL — ABNORMAL LOW (ref >39)
LDL Cholesterol: 62 mg/dL (ref 0–99)
Triglycerides: 159 mg/dL — ABNORMAL HIGH (ref <150)
VLDL: 32 mg/dL (ref 0–40)

## 2012-04-05 LAB — PROTIME-INR
INR: 1.08 (ref 0.00–1.49)
Prothrombin Time: 13.9 seconds (ref 11.6–15.2)

## 2012-04-05 LAB — POCT ACTIVATED CLOTTING TIME: Activated Clotting Time: 432 seconds

## 2012-04-05 LAB — GLUCOSE, CAPILLARY: Glucose-Capillary: 91 mg/dL (ref 70–99)

## 2012-04-05 LAB — APTT: aPTT: 170 seconds — ABNORMAL HIGH (ref 24–37)

## 2012-04-05 SURGERY — LEFT HEART CATHETERIZATION WITH CORONARY ANGIOGRAM
Anesthesia: LOCAL

## 2012-04-05 MED ORDER — ACETAMINOPHEN 325 MG PO TABS: 650.0000 mg | ORAL_TABLET | ORAL | Status: DC | PRN

## 2012-04-05 MED ORDER — METOPROLOL TARTRATE 25 MG PO TABS
25.0000 mg | ORAL_TABLET | Freq: Two times a day (BID) | ORAL | Status: DC
  Administered 2012-04-05 – 2012-04-07 (×6): 25 mg via ORAL
  Filled 2012-04-05 (×7): qty 1

## 2012-04-05 MED ORDER — ASPIRIN EC 81 MG PO TBEC
81.0000 mg | DELAYED_RELEASE_TABLET | Freq: Every day | ORAL | Status: DC
  Administered 2012-04-06 – 2012-04-07 (×2): 81 mg via ORAL
  Filled 2012-04-05 (×3): qty 1

## 2012-04-05 MED ORDER — SODIUM CHLORIDE 0.9 % IJ SOLN
3.0000 mL | Freq: Two times a day (BID) | INTRAMUSCULAR | Status: DC
  Administered 2012-04-05: 3 mL via INTRAVENOUS

## 2012-04-05 MED ORDER — ONDANSETRON HCL 4 MG/2ML IJ SOLN: 4.0000 mg | Freq: Four times a day (QID) | INTRAMUSCULAR | Status: DC | PRN

## 2012-04-05 MED ORDER — CLOPIDOGREL BISULFATE 75 MG PO TABS: 75.0000 mg | ORAL_TABLET | Freq: Every day | ORAL | Status: DC

## 2012-04-05 MED ORDER — DIAZEPAM 5 MG PO TABS
5.0000 mg | ORAL_TABLET | ORAL | Status: AC
  Administered 2012-04-05: 5 mg via ORAL
  Filled 2012-04-05 (×2): qty 1

## 2012-04-05 MED ORDER — ADENOSINE 12 MG/4ML IV SOLN
12.0000 mL | Freq: Once | INTRAVENOUS | Status: DC
  Filled 2012-04-05: qty 12

## 2012-04-05 MED ORDER — SODIUM CHLORIDE 0.9 % IV SOLN
INTRAVENOUS | Status: AC
Start: 2012-04-05 — End: 2012-04-05

## 2012-04-05 MED ORDER — CLOPIDOGREL BISULFATE 75 MG PO TABS
75.0000 mg | ORAL_TABLET | Freq: Every day | ORAL | Status: DC
  Administered 2012-04-05 – 2012-04-07 (×3): 75 mg via ORAL
  Filled 2012-04-05 (×4): qty 1

## 2012-04-05 MED ORDER — PANTOPRAZOLE SODIUM 40 MG PO TBEC
40.0000 mg | DELAYED_RELEASE_TABLET | Freq: Every day | ORAL | Status: DC
  Administered 2012-04-05 – 2012-04-07 (×3): 40 mg via ORAL
  Filled 2012-04-05 (×3): qty 1

## 2012-04-05 MED ORDER — NITROGLYCERIN IN D5W 200-5 MCG/ML-% IV SOLN: 2.0000 ug/min | INTRAVENOUS | Status: DC

## 2012-04-05 MED ORDER — NITROGLYCERIN 0.4 MG SL SUBL: 0.4000 mg | SUBLINGUAL_TABLET | SUBLINGUAL | Status: DC | PRN

## 2012-04-05 MED ORDER — INSULIN ASPART 100 UNIT/ML ~~LOC~~ SOLN
0.0000 [IU] | Freq: Three times a day (TID) | SUBCUTANEOUS | Status: DC
  Administered 2012-04-05: 2 [IU] via SUBCUTANEOUS

## 2012-04-05 MED ORDER — SODIUM BICARBONATE BOLUS VIA INFUSION
INTRAVENOUS | Status: AC
  Filled 2012-04-05: qty 1

## 2012-04-05 MED ORDER — SODIUM CHLORIDE 0.9 % IV SOLN: INTRAVENOUS | Status: DC

## 2012-04-05 MED ORDER — ASPIRIN 81 MG PO CHEW: 81.0000 mg | CHEWABLE_TABLET | Freq: Every day | ORAL | Status: DC

## 2012-04-05 MED ORDER — SODIUM CHLORIDE 0.9 % IJ SOLN: 3.0000 mL | INTRAMUSCULAR | Status: DC | PRN

## 2012-04-05 MED ORDER — FLUOXETINE HCL 10 MG PO CAPS
10.0000 mg | ORAL_CAPSULE | Freq: Every day | ORAL | Status: DC
  Administered 2012-04-05 – 2012-04-07 (×3): 10 mg via ORAL
  Filled 2012-04-05 (×3): qty 1

## 2012-04-05 MED ORDER — SODIUM BICARBONATE 8.4 % IV SOLN
INTRAVENOUS | Status: AC
  Administered 2012-04-05: 08:00:00 via INTRAVENOUS
  Filled 2012-04-05: qty 1000

## 2012-04-05 MED ORDER — NITROGLYCERIN IN D5W 200-5 MCG/ML-% IV SOLN: 5.0000 ug/min | INTRAVENOUS | Status: DC

## 2012-04-05 MED ORDER — ASPIRIN 81 MG PO CHEW
324.0000 mg | CHEWABLE_TABLET | ORAL | Status: AC
  Administered 2012-04-05: 324 mg via ORAL
  Filled 2012-04-05: qty 4

## 2012-04-05 MED ORDER — SODIUM CHLORIDE 0.9 % IV SOLN: 0.2500 mg/kg/h | INTRAVENOUS | Status: AC

## 2012-04-05 MED ORDER — SODIUM CHLORIDE 0.9 % IV SOLN: 250.0000 mL | INTRAVENOUS | Status: DC | PRN

## 2012-04-05 MED ORDER — ATORVASTATIN CALCIUM 20 MG PO TABS
20.0000 mg | ORAL_TABLET | Freq: Every day | ORAL | Status: DC
  Administered 2012-04-05 – 2012-04-07 (×3): 20 mg via ORAL
  Filled 2012-04-05 (×3): qty 1

## 2012-04-05 NOTE — Progress Notes (Signed)
Subjective:  Patient denies any further chest pains or shortness of breath  Objective:  Vital Signs in the last 24 hours: Temp:  [97.7 F (36.5 C)-99 F (37.2 C)] 98.6 F (37 C) (04/01 0845) Pulse Rate:  [70-86] 77 (04/01 0845) Resp:  [15-24] 20 (04/01 0845) BP: (105-155)/(62-98) 132/62 mmHg (04/01 0845) SpO2:  [96 %-100 %] 100 % (04/01 0845) Weight:  [65.772 kg (145 lb)-67 kg (147 lb 11.3 oz)] 67 kg (147 lb 11.3 oz) (04/01 0035)  Intake/Output from previous day: 03/31 0701 - 04/01 0700 In: 1010.1 [I.V.:1010.1] Out: 250 [Urine:250] Intake/Output from this shift:    Physical Exam: Neck: no adenopathy, no carotid bruit, no JVD and supple, symmetrical, trachea midline Lungs: clear to auscultation bilaterally Heart: regular rate and rhythm, S1, S2 normal and Soft systolic murmur and S4 gallop noted Abdomen: soft, non-tender; bowel sounds normal; no masses,  no organomegaly Extremities: extremities normal, atraumatic, no cyanosis or edema  Lab Results:  Recent Labs  04/05/12 0055 04/05/12 0700  WBC 11.2* 10.9*  HGB 9.9* 9.8*  PLT 384 399    Recent Labs  04/05/12 0055 04/05/12 0700  NA 138 140  K 5.1 5.2*  CL 108 110  CO2 22 22  GLUCOSE 117* 99  BUN 17 16  CREATININE 1.69* 1.66*    Recent Labs  04/05/12 0055 04/05/12 0700  TROPONINI >20.00* >20.00*   Hepatic Function Panel  Recent Labs  04/04/12 1815 04/05/12 0055  PROT 8.2 7.0  ALBUMIN 3.6 3.1*  AST 58* 96*  ALT 12 14  ALKPHOS 120* 102  BILITOT 0.2* 0.2*  BILIDIR <0.1  --   IBILI NOT CALCULATED  --     Recent Labs  04/05/12 0700  CHOL 131   No results found for this basename: PROTIME,  in the last 72 hours  Imaging: Imaging results have been reviewed and Dg Chest 2 View  04/04/2012  *RADIOLOGY REPORT*  Clinical Data: Chest pain  CHEST - 2 VIEW  Comparison: March 02, 2011.  Findings: Stable mild cardiomegaly.  No acute pulmonary disease is noted.  No pneumothorax or pleural effusion is  noted.  Bony thorax is intact.  IMPRESSION: No acute cardiopulmonary abnormality seen.   Original Report Authenticated By: Lupita Raider.,  M.D.    Ct Angio Chest Pe W/cm &/or Wo Cm  04/04/2012  *RADIOLOGY REPORT*  Clinical Data: Short of breath and chest pain  CT ANGIOGRAPHY CHEST  Technique:  Multidetector CT imaging of the chest using the standard protocol during bolus administration of intravenous contrast. Multiplanar reconstructed images including MIPs were obtained and reviewed to evaluate the vascular anatomy.  Contrast: 80mL OMNIPAQUE IOHEXOL 350 MG/ML SOLN  Comparison: 03/02/2012  Findings: There are no filling defects in the pulmonary arterial tree to suggest acute pulmonary thromboembolism.  Negative abnormal mediastinal adenopathy.  Coronary artery calcifications in the left main, left anterior descending, circumflex, and right coronary artery.  No pericardial effusion.  No pneumothorax and no pleural effusion.  Dependent atelectasis in both lungs.  Patchy ground-glass in the upper lobes likely represents volume loss.  Chronic sternal fracture.  It is stable.  IMPRESSION: No evidence of acute pulmonary thromboembolism.  Chronic changes.   Original Report Authenticated By: Jolaine Click, M.D.     Cardiac Studies:  Assessment/Plan:  Acute non-Q-wave myocardial infarction  Coronary artery disease  Hypertension  Non-insulin-dependent diabetes mellitus  History of left CVA with right paresis  Hypercholesteremia  GERD  Depression  Chronic kidney disease Chronic anemia Plan  Discussed with patient at length regarding left cardiac cath possible PTCA stenting its risk and benefits i.e. death MI stroke need for emergency CABG local vascular complications etc. and consents for PCI  LOS: 1 day    [name] N 04/05/2012, 9:07 AM

## 2012-04-05 NOTE — Progress Notes (Signed)
Rt fem art sheath removed. Manual pressure held for 30 min and hemostasis achieved. Pressure dressing applied and pt instructed. Rt groin a level 0 and confirmed with Erin-Rn. Rt dp palpable. No problems or complications.Upon exciting room pt in stable condition. Bp 152/79 hr 76.

## 2012-04-05 NOTE — Care Management Note (Addendum)
    Page 1 of 2   04/07/2012     1:47:38 PM   CARE MANAGEMENT NOTE 04/07/2012  Patient:  [name], [name]   Account Number:  [id number]  Date Initiated:  04/05/2012  Documentation initiated by:  Junius Creamer  Subjective/Objective Assessment:   adm w mi     Action/Plan:   lives w husband, pcp dr Minerva Areola dean   Anticipated DC Date:  04/07/2012   Anticipated DC Plan:  HOME W HOME HEALTH SERVICES      DC Planning Services  CM consult      [facility] Choice  HOME HEALTH   Choice offered to / List presented to:  C-1 Patient        HH arranged  HH-1 RN  HH-2 PT  HH-3 OT  HH-4 NURSE'S AIDE      HH agency  [facility]   Status of service:  Completed, signed off Medicare Important Message given?   (If response is "NO", the following Medicare IM given date fields will be blank) Date Medicare IM given:   Date Additional Medicare IM given:    Discharge Disposition:  HOME W HOME HEALTH SERVICES  Per UR Regulation:  Reviewed for med. necessity/level of care/duration of stay  If discussed at Long Length of Stay Meetings, dates discussed:    Comments:  04/07/12 Paymon Rosensteel,RN,BSN [phone] PT FOR DC HOME WITH HUSBAND TODAY...NEEDS HH FOLLOW UP. REFERRAL TO [facility], PER PT CHOICE. START OF CARE 24-48H.  4/1 0951 debbie dowell rn,bsn

## 2012-04-05 NOTE — ED Provider Notes (Signed)
Medical screening examination/treatment/procedure(s) were conducted as a shared visit with non-physician practitioner(s) and myself.  I personally evaluated the patient during the encounter  Intermittent substernal chest pain with SOB worse with climbing stairs.  Nonobstructive CAD on cath 03/17/12.  Initial EKG nonischemic. Initial troponin negative. D/w Dr. Sharyn Lull who reviewed cath record and agrees with serial troponins.  Repeat troponin markedly elevated with T wave inversions inferior laterally.  Glynn Octave, MD 04/05/12 1231

## 2012-04-05 NOTE — CV Procedure (Signed)
8L/PTCA stenting record dictated on 04/05/2012 dictation number is [phone]

## 2012-04-06 LAB — BASIC METABOLIC PANEL
BUN: 13 mg/dL (ref 6–23)
Calcium: 8.1 mg/dL — ABNORMAL LOW (ref 8.4–10.5)
Chloride: 105 mEq/L (ref 96–112)
Creatinine, Ser: 1.66 mg/dL — ABNORMAL HIGH (ref 0.50–1.10)
GFR calc Af Amer: 36 mL/min — ABNORMAL LOW (ref >90)

## 2012-04-06 LAB — CBC
HCT: 25.2 % — ABNORMAL LOW (ref 36.0–46.0)
MCH: 27 pg (ref 26.0–34.0)
MCV: 82.1 fL (ref 78.0–100.0)
Platelets: 333 10*3/uL (ref 150–400)
RDW: 15.5 % (ref 11.5–15.5)
WBC: 10 10*3/uL (ref 4.0–10.5)

## 2012-04-06 LAB — TROPONIN I: Troponin I: 16.95 ng/mL (ref <0.30)

## 2012-04-06 LAB — CK TOTAL AND CKMB (NOT AT ARMC): Total CK: 470 U/L — ABNORMAL HIGH (ref 7–177)

## 2012-04-06 LAB — GLUCOSE, CAPILLARY
Glucose-Capillary: 113 mg/dL — ABNORMAL HIGH (ref 70–99)
Glucose-Capillary: 89 mg/dL (ref 70–99)
Glucose-Capillary: 113 mg/dL — ABNORMAL HIGH (ref 70–99)

## 2012-04-06 MED ORDER — MAGNESIUM HYDROXIDE 400 MG/5ML PO SUSP: 15.0000 mL | Freq: Two times a day (BID) | ORAL | Status: DC | PRN

## 2012-04-06 MED ORDER — FERROUS SULFATE 325 (65 FE) MG PO TABS
325.0000 mg | ORAL_TABLET | Freq: Three times a day (TID) | ORAL | Status: DC
  Administered 2012-04-06 – 2012-04-07 (×2): 325 mg via ORAL
  Filled 2012-04-06 (×6): qty 1

## 2012-04-06 NOTE — Evaluation (Addendum)
Physical Therapy Evaluation Patient Details Name: Lakeisa Heninger MRN: [id number] DOB: 1945-12-24 Today's Date: 04/06/2012 Time: 4098-1191 PT Time Calculation (min): 16 min  PT Assessment / Plan / Recommendation Clinical Impression  67 y/o female with h/o CVA adm. for NSTEMI s/p PCI and stent. Presents to PT today with below impairments affecting PLOF and independence. Will benefit physical therapy in the acute setting to maximuze functional independence for safe d/c home. Needs to be able to go up and down 1 full flight of steps with minA for safe d/c home.     PT Assessment  Patient needs continued PT services    Follow Up Recommendations  Home health PT;Supervision/Assistance - 24 hour    Does the patient have the potential to tolerate intense rehabilitation      Barriers to Discharge Inaccessible home environment full flight of steps to get to her room    Equipment Recommendations  None recommended by PT    Recommendations for Other Services     Frequency Min 3X/week    Precautions / Restrictions Precautions Precautions: Fall Precaution Comments: old right hemi Restrictions Weight Bearing Restrictions: No   Pertinent Vitals/Pain Denies pain      Mobility  Bed Mobility Bed Mobility: Supine to Sit;Sitting - Scoot to [place] of Bed;Scooting to HOB Supine to Sit: 5: Supervision Sitting - Scoot to Edge of Bed: 4: Min assist Scooting to [facility]: 3: Mod assist;With rail (bed in trendelenberg) Details for Bed Mobility Assistance: increased time to coordinate, minA to scoot right hip to EOB; sequencing cues to scoot HOB with use of pad Transfers Transfers: Sit to Stand;Stand to Sit Sit to Stand: 4: Min assist;With upper extremity assist;From bed Stand to Sit: To bed;With upper extremity assist;4: Min assist Details for Transfer Assistance: sequencing cues and minA for stability, needs bilateral upper extremity support for stability Ambulation/Gait Ambulation/Gait Assistance: 3: Mod  assist Ambulation Distance (Feet): 18 Feet Assistive device: 1 person hand held assist Ambulation/Gait Assistance Details: RUE HHA in addition to patient reaching for LUE enviromental supports (at times out of reach and performing unsafely); cues for safety and foot clearance of RLE as well as facilitation around trunk for stability Gait Pattern: Step-to pattern;Decreased stance time - right;Decreased stride length;Decreased step length - right Gait velocity: decreased    Exercises     PT Diagnosis: Difficulty walking;Abnormality of gait;Generalized weakness;Hemiplegia dominant side  PT Problem List: Decreased strength;Decreased activity tolerance;Decreased balance;Decreased mobility;Decreased coordination PT Treatment Interventions: DME instruction;Gait training;Functional mobility training;Stair training;Therapeutic activities;Therapeutic exercise;Balance training;Neuromuscular re-education;Patient/family education   PT Goals Acute Rehab PT Goals PT Goal Formulation: With patient Time For Goal Achievement: 04/13/12 Potential to Achieve Goals: Good Pt will go Supine/Side to Sit: with supervision PT Goal: Supine/Side to Sit - Progress: Goal set today Pt will go Sit to Supine/Side: with supervision PT Goal: Sit to Supine/Side - Progress: Goal set today Pt will go Sit to Stand: with supervision PT Goal: Sit to Stand - Progress: Goal set today Pt will go Stand to Sit: with supervision PT Goal: Stand to Sit - Progress: Goal set today Pt will Transfer Bed to Chair/Chair to Bed: with supervision PT Transfer Goal: Bed to Chair/Chair to Bed - Progress: Goal set today Pt will Ambulate: 16 - 50 feet;with supervision;with least restrictive assistive device PT Goal: Ambulate - Progress: Goal set today Pt will Go Up / Down Stairs: Flight;with min assist;with rail(s) PT Goal: Up/Down Stairs - Progress: Goal set today Pt will Perform Home Exercise Program: with supervision, verbal cues  required/provided PT Goal: Perform Home Exercise Program - Progress: Goal set today  Visit Information  Last PT Received On: 04/06/12 Assistance Needed: +1    Subjective Data  Subjective: I feel on my way to bathroom 2 weeks ago. Patient Stated Goal: home   Prior Functioning  Home Living Lives With: Spouse;Family Available Help at Discharge: Family;Available 24 hours/day Type of Home: House Home Access: Level entry Home Layout: Two level;Bed/bath upstairs Alternate Level Stairs-Number of Steps: 1 flight Bathroom Shower/Tub: Engineer, manufacturing systems: Standard Home Adaptive Equipment: Environmental consultant - rolling;Straight cane Prior Function Level of Independence: Needs assistance Needs Assistance: Bathing;Dressing;Meal Prep;Light Housekeeping Bath: Minimal Dressing: Minimal Meal Prep: Maximal Light Housekeeping: Maximal Able to Take Stairs?: Yes Driving: No Vocation: On disability Comments: says she needs assist sometimes, takes a sponge bath; uses RW at times but at times doesn't use it Communication Communication: Expressive difficulties    Cognition  Cognition Overall Cognitive Status: Appears within functional limits for tasks assessed/performed Arousal/Alertness: Awake/alert Orientation Level: Appears intact for tasks assessed Behavior During Session: [facility] for tasks performed Cognition - Other Comments: very pleasant    Extremity/Trunk Assessment Right Upper Extremity Assessment RUE ROM/Strength/Tone Deficits: strength 4+/5 RUE Sensation: WFL - Light Touch RUE Coordination: Deficits RUE Coordination Deficits: extra time to coordinate limb and fingers Left Upper Extremity Assessment LUE ROM/Strength/Tone: Within functional levels Right Lower Extremity Assessment RLE ROM/Strength/Tone: Deficits RLE ROM/Strength/Tone Deficits: grossly 4/5, no buckling noted RLE Coordination: Deficits RLE Coordination Deficits: increased time to coordinate, drags her RLE during  gait Left Lower Extremity Assessment LLE ROM/Strength/Tone: Within functional levels   Balance    End of Session PT - End of Session Equipment Utilized During Treatment: Gait belt Activity Tolerance: Patient tolerated treatment well Patient left: in bed;with call bell/phone within reach Nurse Communication: Mobility status  GP     [facility] HELEN 04/06/2012, 3:34 PM

## 2012-04-06 NOTE — Progress Notes (Signed)
Subjective:  Patient denies any chest pain or shortness of breath. States feels better after PCI  Objective:  Vital Signs in the last 24 hours: Temp:  [98.2 F (36.8 C)-98.9 F (37.2 C)] 98.9 F (37.2 C) (04/02 0400) Pulse Rate:  [67-85] 84 (04/02 0400) Resp:  [17-24] 18 (04/02 0400) BP: (114-159)/(62-142) 137/76 mmHg (04/02 0400) SpO2:  [91 %-100 %] 100 % (04/02 0400)  Intake/Output from previous day: 04/01 0701 - 04/02 0700 In: 1990 [I.V.:1990] Out: -  Intake/Output from this shift:    Physical Exam: Neck: no adenopathy, no carotid bruit, no JVD and supple, symmetrical, trachea midline Lungs: clear to auscultation bilaterally Heart: regular rate and rhythm, S1, S2 normal and Soft systolic murmur noted no S3 gallop Abdomen: soft, non-tender; bowel sounds normal; no masses,  no organomegaly Extremities: extremities normal, atraumatic, no cyanosis or edema and Right groin dressing dry no evidence of hematoma  Lab Results:  Recent Labs  04/05/12 0700 04/06/12 0405  WBC 10.9* 10.0  HGB 9.8* 8.3*  PLT 399 333    Recent Labs  04/05/12 0700 04/06/12 0405  NA 140 138  K 5.2* 4.2  CL 110 105  CO2 22 24  GLUCOSE 99 91  BUN 16 13  CREATININE 1.66* 1.66*    Recent Labs  04/05/12 1235 04/06/12 0405  TROPONINI >20.00* 16.95*   Hepatic Function Panel  Recent Labs  04/04/12 1815 04/05/12 0055  PROT 8.2 7.0  ALBUMIN 3.6 3.1*  AST 58* 96*  ALT 12 14  ALKPHOS 120* 102  BILITOT 0.2* 0.2*  BILIDIR <0.1  --   IBILI NOT CALCULATED  --     Recent Labs  04/05/12 0700  CHOL 131   No results found for this basename: PROTIME,  in the last 72 hours  Imaging: Imaging results have been reviewed and Dg Chest 2 View  04/04/2012  *RADIOLOGY REPORT*  Clinical Data: Chest pain  CHEST - 2 VIEW  Comparison: March 02, 2011.  Findings: Stable mild cardiomegaly.  No acute pulmonary disease is noted.  No pneumothorax or pleural effusion is noted.  Bony thorax is intact.   IMPRESSION: No acute cardiopulmonary abnormality seen.   Original Report Authenticated By: Lupita Raider.,  M.D.    Ct Angio Chest Pe W/cm &/or Wo Cm  04/04/2012  *RADIOLOGY REPORT*  Clinical Data: Short of breath and chest pain  CT ANGIOGRAPHY CHEST  Technique:  Multidetector CT imaging of the chest using the standard protocol during bolus administration of intravenous contrast. Multiplanar reconstructed images including MIPs were obtained and reviewed to evaluate the vascular anatomy.  Contrast: 80mL OMNIPAQUE IOHEXOL 350 MG/ML SOLN  Comparison: 03/02/2012  Findings: There are no filling defects in the pulmonary arterial tree to suggest acute pulmonary thromboembolism.  Negative abnormal mediastinal adenopathy.  Coronary artery calcifications in the left main, left anterior descending, circumflex, and right coronary artery.  No pericardial effusion.  No pneumothorax and no pleural effusion.  Dependent atelectasis in both lungs.  Patchy ground-glass in the upper lobes likely represents volume loss.  Chronic sternal fracture.  It is stable.  IMPRESSION: No evidence of acute pulmonary thromboembolism.  Chronic changes.   Original Report Authenticated By: Jolaine Click, M.D.     Cardiac Studies:  Assessment/Plan:  Status post Acute non-Q-wave myocardial infarction status post PCI to RCA Coronary artery disease  Hypertension  Non-insulin-dependent diabetes mellitus  History of left CVA with right paresis  Hypercholesteremia  GERD  Depression  Chronic kidney disease  Acute on  Chronic anemia secondary to hydration and blood loss during the procedure Plan Transfer to telemetry Check labs in a.m. OT PT consult Encourage by mouth fluids Will add ACE inhibitor tomorrow if renal function remained stable  LOS: 2 days    Irving Bloor N 04/06/2012, 8:31 AM

## 2012-04-06 NOTE — Op Note (Signed)
NAMEBRANDEN, VINE                   ACCOUNT NO.:  [id number]  MEDICAL RECORD NO.:  [id number]  LOCATION:  2924                         FACILITY:  [facility]  PHYSICIAN:  Eduardo Osier. Sharyn Lull, M.D. DATE OF BIRTH:  October 02, 1945  DATE OF PROCEDURE:  04/05/2012 DATE OF DISCHARGE:                              OPERATIVE REPORT   PROCEDURE: 1. Left cardiac cath with selective left and right coronary     angiography, left ventriculography via right groin using Judkins     technique. 2. Measurement of fractional flow reserve of right coronary artery,     which needs to be stopped due to marked sinus bradycardia and     hypotension. 3. Successful percutaneous transluminal coronary angioplasty to mid     right coronary artery using initially 2.0 x 12-mm long mini Trek     balloon followed by 2.75 x 12-mm long [place] Quantum apex balloon. 4. Successful deployment of 2.5 x 33 mm long Xience drug-eluting stent     in mid and proximal junction of right coronary artery. 5. Successful postdilatation of this stent using 2.75 x 15-mm long [place]     Quantum apex balloon. 6. Successful percutaneous transluminal coronary angioplasty to mid     and distal junction of right coronary artery using 2.5 x 8 mm long     Emerge balloon going up to 6-8 atmospheric pressure.  INDICATION FOR THE PROCEDURE:  Ms. Schweppe is a 67 year old female with past medical history significant for mild-to-moderate coronary artery disease, recently had cardiac cath approximately 3 weeks ago, hypertension, non-insulin-dependent diabetes mellitus, history of CVA with right paresis, hypercholesteremia, GERD, chronic anemia, history of chronic kidney disease, depression.  She came to the ER complaining of retrosternal chest pain radiating to the back associated with mild shortness of breath since earlier yesterday afternoon off and on.  The patient denied any nausea, vomiting, diaphoresis.  Denies palpitation, lightheadedness, or syncope.  Initial  EKG done in the ER showed normal sinus rhythm with poor R-wave progression in anteroseptal leads and repeat EKG showed normal sinus rhythm with poor R-wave progression in anteroseptal leads and marked ST-T wave changes in inferolateral leads, and was noted to have markedly elevated troponin-I point of care above 22 and by lab above 20.  The patient also was noted to have mildly elevated D-dimer, subsequently had spiral CT of the chest, which was negative for pulmonary embolism.  The patient presently when seen in the ER denied any chest pain on heparin and nitro drip.  Due to typical anginal chest pain, EKG changes, markedly elevated troponin, I discussed with the patient regarding left cath, possible PTCA stenting, its risks and benefits, i.e., death, MI, stroke, need for emergency CABG, local vascular complications, etc., and consented for PCI.  PROCEDURE:  After obtaining informed consent, the patient was brought to the cath lab and was placed on fluoroscopy table.  Right groin was prepped and draped in usual fashion.  Xylocaine 1% was used for local anesthesia in the right groin.  With the help of thin wall needle, 6- French arterial sheath was placed.  The sheath was aspirated and flushed.  Next, 6-French left Judkins catheter  was advanced over the wire under fluoroscopic guidance up to the ascending aorta.  Wire was pulled out.  The catheter was aspirated and connected to the Manifold. Catheter was further advanced and engaged into left coronary ostium. Multiple views of the left system were taken.  Next, catheter was disengaged and was pulled out over the wire and was replaced with 6- [place] right Judkins catheter, which was advanced over the wire under fluoroscopic guidance up to the ascending aorta.  Wire was pulled out. The catheter was aspirated and connected to the Manifold.  Catheter was further advanced across the aortic valve into the LV.  LV pressures were recorded.   Next, LV graphy was done in 30-degree RAO position.  Post- angiographic pressures were recorded from LV and then pullback pressures were recorded from the aorta.  There was no significant gradient across aortic valve.  Next, the pigtail catheter was pulled out over the wire.  FINDINGS:  LV showed marked mid inferior wall hypokinesia, EF of approximately 40% to 45%.  Left main had 15% to 20% distal stenosis as before.  LAD has 25% to 30% ostial stenosis.  Distally at apex, LAD is diffusely narrow.  Diagonal 1 has mild proximal disease.  Diagonal 2 is very small.  Ramus has 30% to 40% ostial stenosis.  Left circumflex has mild disease.  OM 1 is moderate sized, which bifurcates in inferior and superior branch distally the inferior branch is less than 1 mm, which is occluded.  OM 2 and OM 3 were very small, which were patent.  RCA has 20% to 30% proximal stenosis and 60% to 70% hazy napkin ring sequential stenosis.  Vessel is calcified.  PDA and PLV branches were very small.  INTERVENTIONAL PROCEDURE:  As the patient had marked elevated troponin- I, typical anginal chest pain, and moderately severe inferior wall hypokinesis, fractional flow reserve was done for RCA to evaluate the physiological significance of mid RCA lesion.  Adenosine was stopped during the procedure as the patient became severely bradycardic and hypotensive, although at that time FFR was 0.85.  I had significant difficulty passing the FFR PrimeWire, need to use a Prowater buddy wire to cross the lesion followed by crossing the lesion followed by a PrimeWire.  As I had difficulty in passing the wire through the lesion, it appears to be very calcified and significant and FFR could not be completed as the patient became hypotensive and bradycardic requiring stopping of the adenosine, so we decided to proceed with PCI. Successful PTCA to mid RCA was done initially using 2.0 x 12-mm long mini Trek balloon for predilatation.   Attempted to deploy 2.5 x 33 mm long Xience Xpedition drug-eluting stent without success and then PTCA to mid RCA was done using 2.75 x 12-mm long [place] Quantum apex balloon. Multiple inflations were done.  Then 2.5 x 33 mm long Xience Xpedition drug-eluting stent was deployed in mid RCA using Mailman buddy wire. This stent was deployed at 13 atmospheric pressure.  The stent was post dilated using 2.75 x 15 mm long [place] Quantum apex balloon going up to 18- 22 atmospheric pressure.  Lesions dilated from 60% to 70% to 0% residual.  Angiogram showed linear dissection distal to the stent.  Then PTCA to distal RCA was mid and distal junction of RCA was done using 2.5 x 8 mm long Emerge balloon going up to 6-8 atmospheric pressure. Angiogram showed persistent linear dissection with good TIMI grade 3 distal flow.  Attempted to deploy  2.5 x 12 mm long Xience Xpedition drug- eluting stent without success as stent will not track down through the mid stent.  The patient tolerated the procedure well.  The patient was chest pain-free during the procedure.  There were no complications.  The patient had good TIMI grade 3 distal flow with linear dissection, but no evidence of distal embolization.  Total amount of contrast used during the procedure 210 mL.  Plan is to treat medically at this point.  If the patient continues to have recurrent chest pain, we will bring her back to re-evaluate the distal RCA linear dissection, hopefully that dissection will heal without requiring any further interventions.     Eduardo Osier. Sharyn Lull, M.D.     [facility]  D:  04/05/2012  T:  04/06/2012  Job:  [id number]

## 2012-04-06 NOTE — Progress Notes (Signed)
CARDIAC REHAB PHASE I   PRE:  Rate/Rhythm: 81 SR  BP:  Supine:   Sitting: 120/76  Standing:    SaO2: 94 RA  MODE:  Ambulation: 50 ft   POST:  Rate/Rhythm: 94  BP:  Supine:   Sitting: 136/76  Standing:    SaO2: 99 RA 1355-1445  Assisted X 2 used gait belt and walker to ambulate. Pt weak drags right foot with walking. Pt tires easily and was tired by end of 50 foot walk. Her husband states that she is not able to walk much at home.and that her walking was better when she had Physical therapy coming to their home. Pt to bed after walk. Started MI education with pt and husband. Discussed ASA ans Plavix also risk factors. We will continue to follow pt. Melina Copa RN 04/06/2012 2:47 PM

## 2012-04-06 NOTE — Progress Notes (Signed)
Echocardiogram 2D Echocardiogram has been performed.  [name] 04/06/2012, 12:54 PM

## 2012-04-06 NOTE — Progress Notes (Signed)
CRITICAL VALUE ALERT  Critical value received:  CKMB  Date of notification:  04/06/2012  Time of notification:  0547  Critical value read back:yes  Nurse who received alert:  Elson Clan  MD notified (1st page):  Harwani  Time of first page:    MD notified (2nd page):  Time of second page:  Responding MD:   Time MD responded:    **Expected value. Pt post cath.

## 2012-04-07 LAB — CBC
HCT: 25.3 % — ABNORMAL LOW (ref 36.0–46.0)
Hemoglobin: 8.4 g/dL — ABNORMAL LOW (ref 12.0–15.0)
MCH: 26.8 pg (ref 26.0–34.0)
MCHC: 33.2 g/dL (ref 30.0–36.0)
RDW: 15.5 % (ref 11.5–15.5)

## 2012-04-07 LAB — BASIC METABOLIC PANEL
BUN: 15 mg/dL (ref 6–23)
Calcium: 8.7 mg/dL (ref 8.4–10.5)
GFR calc Af Amer: 36 mL/min — ABNORMAL LOW (ref >90)
GFR calc non Af Amer: 31 mL/min — ABNORMAL LOW (ref >90)
Glucose, Bld: 123 mg/dL — ABNORMAL HIGH (ref 70–99)

## 2012-04-07 LAB — GLUCOSE, CAPILLARY
Glucose-Capillary: 95 mg/dL (ref 70–99)
Glucose-Capillary: 115 mg/dL — ABNORMAL HIGH (ref 70–99)

## 2012-04-07 MED ORDER — NITROGLYCERIN 0.4 MG SL SUBL: 0.4000 mg | SUBLINGUAL_TABLET | SUBLINGUAL | Status: DC | PRN

## 2012-04-07 MED ORDER — RAMIPRIL 2.5 MG PO CAPS
2.5000 mg | ORAL_CAPSULE | Freq: Every day | ORAL | Status: DC
  Filled 2012-04-07: qty 1

## 2012-04-07 MED ORDER — MAGNESIUM HYDROXIDE 400 MG/5ML PO SUSP: 15.0000 mL | Freq: Two times a day (BID) | ORAL | Status: DC | PRN

## 2012-04-07 MED ORDER — SODIUM CHLORIDE 0.9 % IJ SOLN
3.0000 mL | Freq: Two times a day (BID) | INTRAMUSCULAR | Status: DC
  Administered 2012-04-07: 3 mL via INTRAVENOUS

## 2012-04-07 MED ORDER — RAMIPRIL 2.5 MG PO CAPS: 2.5000 mg | ORAL_CAPSULE | Freq: Every day | ORAL | Status: DC

## 2012-04-07 MED ORDER — ASPIRIN 81 MG PO TBEC: 81.0000 mg | DELAYED_RELEASE_TABLET | Freq: Every day | ORAL | Status: DC

## 2012-04-07 MED ORDER — FERROUS SULFATE 325 (65 FE) MG PO TABS: 325.0000 mg | ORAL_TABLET | Freq: Three times a day (TID) | ORAL | Status: DC

## 2012-04-07 MED ORDER — PANTOPRAZOLE SODIUM 40 MG PO TBEC: 40.0000 mg | DELAYED_RELEASE_TABLET | Freq: Every day | ORAL | Status: DC

## 2012-04-07 NOTE — Progress Notes (Signed)
CARDIAC REHAB PHASE I   PRE:  Rate/Rhythm: 83 SR    BP: sitting 110/60    SaO2: 100 RA  MODE:  Ambulation: 180 ft   POST:  Rate/Rhythm: 92    BP: sitting 120/76     SaO2: 100 RA  Stronger today, increased distance. Used RW, assist x2. Pleasant attitude. To recliner again.  [id number]  Elissa Lovett Falconer CES, ACSM 04/07/2012 9:01 AM

## 2012-04-07 NOTE — Discharge Summary (Signed)
  Discharge summary dictated on 04/07/2012 dictation number is [phone]

## 2012-04-08 NOTE — Discharge Summary (Signed)
NAMELATHA, STAUNTON                   ACCOUNT NO.:  [id number]  MEDICAL RECORD NO.:  [id number]  LOCATION:  2010                         FACILITY:  [facility]  PHYSICIAN:  Khaden Gater N. Sharyn Lull, M.D. DATE OF BIRTH:  May 25, 1945  DATE OF ADMISSION:  04/04/2012 DATE OF DISCHARGE:  04/07/2012                              DISCHARGE SUMMARY   ADMITTING DIAGNOSES: 1. Acute non-Q-wave myocardial infarction. 2. Coronary artery disease. 3. Hypertension. 4. Non-insulin-dependent diabetes mellitus. 5. History of left cerebrovascular accident with right paresis in the     past. 6. Hypercholesteremia. 7. Gastroesophageal reflux disease. 8. Depression. 9. Chronic kidney disease. 10.Anemia of chronic disease.  DISCHARGE DIAGNOSES: 1. Status post acute non-Q-wave myocardial infarction status post left     catheterization/fractional flow reserve and PTCA stenting to mid     RCA. 2. Coronary artery disease. 3. Hypertension. 4. Non-insulin-dependent diabetes mellitus. 5. History of left cerebrovascular accident with right paresis. 6. Hypercholesteremia. 7. Gastroesophageal reflux disease. 8. Depression. 9. Anemia of chronic disease. 10.Chronic kidney disease, stable.  DISCHARGE HOME MEDICATIONS:  Enteric-coated aspirin 81 mg 1 tablet daily, Feosol 325 mg 3 times daily with meals, milk of magnesia 15 mL twice daily as needed, Nitrostat 0.4 mg sublingual use as directed, Protonix 40 mg 1 tablet daily, ramipril 2.5 mg 1 capsule daily, atorvastatin 20 mg 1 tablet daily, clopidogrel 75 mg 1 tablet daily, Flexeril 5 mg 3 times daily as needed for muscle spasm, Prozac 10 mg 1 capsule daily, metformin 500 mg twice daily, metoprolol succinate 50 mg 1 tablet daily.  DIET:  Low-salt, low-cholesterol, 1800 calories ADA diet.  The patient has been advised to monitor blood pressure and blood sugar daily.  Followup with me in 1 week.  CONDITION AT DISCHARGE:  Stable.  The patient will be discharged home with  family.  Discussed with the patient's husband regarding skilled nursing facility, but states he has a lot of help at home and would like to take his wife home.  BRIEF HISTORY AND HOSPITAL COURSE:  Ms. Bogucki is 67 year old female with past medical history significant for mild to moderate coronary artery disease.  Recently had cardiac catheterization approximately 3 weeks ago, hypertension, non-insulin-dependent diabetes mellitus, history of CVA with right paresis, hypercholesteremia, GERD, depression, chronic kidney disease, anemia of chronic disease.  She came to the ER complaining of retrosternal chest pain radiating to the back associated with mild shortness of breath.  Since earlier this afternoon off and on, the patient denies any nausea, vomiting, diaphoresis.  Denies any palpitation, lightheadedness, or syncope.  Initial EKG done in the ER showed normal sinus rhythm with poor R-wave progression in anteroseptal leads.  Repeat EKG showed normal sinus rhythm with poor R-wave progression in anteroseptal leads and marked ST-T wave changes in inferolateral leads and was noted to have markedly elevated troponin I of 22.  Repeat troponin I by lab was above 20.  The patient also was noted to have mildly elevated D-dimer, subsequently had spiral CT of the chest which was negative for pulmonary embolism in the ER.  The patient was started on IV heparin, nitrates with relief of chest pain in the ED.  PAST MEDICAL  HISTORY:  As above.  PAST SURGICAL HISTORY:  She had tubal ligation in the past.  PHYSICAL EXAMINATION:  GENERAL:  She was alert, awake, and oriented x3. VITAL SIGNS:  Blood pressure was 130/75, pulse was 86.  She was afebrile. HEENT:  Conjunctivae was pink. NECK:  Supple.  No JVD.  No bruit. LUNGS:  Clear to auscultation without rhonchi or rales. CARDIOVASCULAR:  S1, S2 was normal.  There was soft systolic murmur and S4 gallop. ABDOMEN:  Soft.  Bowel sounds were present.   Nontender. EXTREMITIES:  There is no clubbing, cyanosis, or edema. NEUROLOGIC:  She was alert, awake, oriented, had right paresis as before.  LABORATORY DATA:  Sodium was 139, potassium 4.5, BUN 18, and creatinine 1.85.  Troponin I was 22.85.  Repeat troponin I by lab was about 20, point of care repeat troponin was 22.86 second set.  Third set troponin I were above 20.  Yesterday troponin I was 16.95, today is 7.96 which is trending down.  CK was 470, MB 24.8.  Today CK is 197, MB 7.1 which is trending down.  Her hemoglobin was 9.9, hematocrit 30.7, white count of 11.2.  Repeat hemoglobin was 9.8, hematocrit 29.8, white count of 10.9. Postprocedure hemoglobin was 8.3, hematocrit 25.2.  Repeat today hemoglobin is 8.4, hematocrit 25.3, which has been stable.  BRIEF HOSPITAL COURSE:  The patient was admitted to step-down unit.  The patient ruled in for non-Q-wave myocardial infarction.  The patient subsequently underwent left cardiac cath with selective left and right coronary angiography, FFR as per procedure report.  The patient was also noted to have occluded very small branch of OM2 distally, which was felt not to be the only reason for her acute non-Q-wave myocardial infarction, due to markedly elevated troponin I and also inferior midwall significant hypokinesia.  So, FFR was done during FFR.  The patient during adenosine infusion after 2 minutes, the patient became hypotensive and bradycardic, requiring stopping the adenosine and the patient subsequently underwent PTCA stenting to RCA as per procedure report.  Postprocedure, the patient did not have any chest pains during the hospital stay.  Her groin is stable with no evidence of hematoma or bruit.  Phase 1 cardiac rehab was called, and then OT-PT consult was called.  The patient ambulated today 150 yards without any problems. The patient's groin is stable.  Discussed with the patient's husband at home.  Regarding disposition  husband stated that he would like to take his wife home and has plenty of help at home.  The patient will be discharged home on above medications and will be followed up in my office in 1 week.  We will discuss with the patient as outpatient regarding phase 2 cardiac rehab.     Eduardo Osier. Sharyn Lull, M.D.     [facility]  D:  04/07/2012  T:  04/08/2012  Job:  [id number]

## 2012-10-03 ENCOUNTER — Other Ambulatory Visit: Payer: Self-pay | Admitting: Cardiology

## 2012-10-03 DIAGNOSIS — I6529 Occlusion and stenosis of unspecified carotid artery: Secondary | ICD-10-CM

## 2012-10-14 ENCOUNTER — Other Ambulatory Visit (HOSPITAL_COMMUNITY): Payer: Self-pay | Admitting: Cardiology

## 2012-10-14 DIAGNOSIS — R079 Chest pain, unspecified: Secondary | ICD-10-CM

## 2012-10-19 ENCOUNTER — Ambulatory Visit (HOSPITAL_COMMUNITY)
Admission: RE | Admit: 2012-10-19 | Discharge: 2012-10-19 | Disposition: A | Discharge status: Home / Self Care | Payer: Medicare Other | Source: Ambulatory Visit | Attending: Vascular Surgery | Admitting: Vascular Surgery

## 2012-10-19 DIAGNOSIS — I6529 Occlusion and stenosis of unspecified carotid artery: Secondary | ICD-10-CM

## 2012-10-21 ENCOUNTER — Encounter (HOSPITAL_COMMUNITY)
Admission: RE | Admit: 2012-10-21 | Discharge: 2012-10-21 | Disposition: A | Discharge status: Home / Self Care | Payer: Medicare Other | Source: Ambulatory Visit | Attending: Cardiology | Admitting: Cardiology

## 2012-10-21 ENCOUNTER — Other Ambulatory Visit: Payer: Self-pay

## 2012-10-21 DIAGNOSIS — R079 Chest pain, unspecified: Secondary | ICD-10-CM | POA: Insufficient documentation

## 2012-10-21 MED ORDER — REGADENOSON 0.4 MG/5ML IV SOLN
0.4000 mg | Freq: Once | INTRAVENOUS | Status: AC
  Administered 2012-10-21: 0.4 mg via INTRAVENOUS

## 2012-10-21 MED ORDER — TECHNETIUM TC 99M SESTAMIBI GENERIC - CARDIOLITE
30.0000 | Freq: Once | INTRAVENOUS | Status: AC | PRN
  Administered 2012-10-21: 30 via INTRAVENOUS

## 2012-10-21 MED ORDER — TECHNETIUM TC 99M SESTAMIBI GENERIC - CARDIOLITE
10.0000 | Freq: Once | INTRAVENOUS | Status: AC | PRN
  Administered 2012-10-21: 10 via INTRAVENOUS

## 2012-10-21 MED ORDER — REGADENOSON 0.4 MG/5ML IV SOLN
INTRAVENOUS | Status: AC
  Filled 2012-10-21: qty 5

## 2013-01-19 ENCOUNTER — Inpatient Hospital Stay (HOSPITAL_COMMUNITY): Payer: Medicare Other

## 2013-01-19 ENCOUNTER — Encounter (HOSPITAL_COMMUNITY): Payer: Self-pay | Admitting: Emergency Medicine

## 2013-01-19 ENCOUNTER — Emergency Department (HOSPITAL_COMMUNITY): Payer: Medicare Other

## 2013-01-19 ENCOUNTER — Inpatient Hospital Stay (HOSPITAL_COMMUNITY)
Admission: EM | Admit: 2013-01-19 | Discharge: 2013-01-22 | DRG: 308 | Disposition: A | Discharge status: Home Health | Payer: Medicare Other | Attending: Internal Medicine | Admitting: Internal Medicine

## 2013-01-19 DIAGNOSIS — R55 Syncope and collapse: Secondary | ICD-10-CM

## 2013-01-19 DIAGNOSIS — R531 Weakness: Secondary | ICD-10-CM | POA: Diagnosis present

## 2013-01-19 DIAGNOSIS — I509 Heart failure, unspecified: Secondary | ICD-10-CM | POA: Diagnosis present

## 2013-01-19 DIAGNOSIS — I69959 Hemiplegia and hemiparesis following unspecified cerebrovascular disease affecting unspecified side: Secondary | ICD-10-CM

## 2013-01-19 DIAGNOSIS — R630 Anorexia: Secondary | ICD-10-CM | POA: Diagnosis present

## 2013-01-19 DIAGNOSIS — R079 Chest pain, unspecified: Secondary | ICD-10-CM | POA: Diagnosis present

## 2013-01-19 DIAGNOSIS — N170 Acute kidney failure with tubular necrosis: Secondary | ICD-10-CM | POA: Diagnosis present

## 2013-01-19 DIAGNOSIS — E860 Dehydration: Secondary | ICD-10-CM | POA: Diagnosis present

## 2013-01-19 DIAGNOSIS — I6350 Cerebral infarction due to unspecified occlusion or stenosis of unspecified cerebral artery: Secondary | ICD-10-CM

## 2013-01-19 DIAGNOSIS — I639 Cerebral infarction, unspecified: Secondary | ICD-10-CM | POA: Diagnosis present

## 2013-01-19 DIAGNOSIS — Z7902 Long term (current) use of antithrombotics/antiplatelets: Secondary | ICD-10-CM

## 2013-01-19 DIAGNOSIS — R5381 Other malaise: Secondary | ICD-10-CM

## 2013-01-19 DIAGNOSIS — R471 Dysarthria and anarthria: Secondary | ICD-10-CM | POA: Diagnosis present

## 2013-01-19 DIAGNOSIS — Z880 Allergy status to penicillin: Secondary | ICD-10-CM

## 2013-01-19 DIAGNOSIS — R000 Tachycardia, unspecified: Secondary | ICD-10-CM

## 2013-01-19 DIAGNOSIS — E119 Type 2 diabetes mellitus without complications: Secondary | ICD-10-CM | POA: Diagnosis present

## 2013-01-19 DIAGNOSIS — Z7901 Long term (current) use of anticoagulants: Secondary | ICD-10-CM

## 2013-01-19 DIAGNOSIS — I498 Other specified cardiac arrhythmias: Principal | ICD-10-CM | POA: Diagnosis present

## 2013-01-19 DIAGNOSIS — R5383 Other fatigue: Secondary | ICD-10-CM

## 2013-01-19 DIAGNOSIS — Z8672 Personal history of thrombophlebitis: Secondary | ICD-10-CM

## 2013-01-19 DIAGNOSIS — N179 Acute kidney failure, unspecified: Secondary | ICD-10-CM | POA: Diagnosis present

## 2013-01-19 DIAGNOSIS — Z86718 Personal history of other venous thrombosis and embolism: Secondary | ICD-10-CM

## 2013-01-19 DIAGNOSIS — I2510 Atherosclerotic heart disease of native coronary artery without angina pectoris: Secondary | ICD-10-CM | POA: Diagnosis present

## 2013-01-19 DIAGNOSIS — Z79899 Other long term (current) drug therapy: Secondary | ICD-10-CM

## 2013-01-19 DIAGNOSIS — Z87891 Personal history of nicotine dependence: Secondary | ICD-10-CM

## 2013-01-19 DIAGNOSIS — I5022 Chronic systolic (congestive) heart failure: Secondary | ICD-10-CM | POA: Diagnosis present

## 2013-01-19 DIAGNOSIS — Z7982 Long term (current) use of aspirin: Secondary | ICD-10-CM

## 2013-01-19 HISTORY — DX: Unspecified osteoarthritis, unspecified site: M19.90

## 2013-01-19 HISTORY — DX: Gastro-esophageal reflux disease without esophagitis: K21.9

## 2013-01-19 HISTORY — DX: Exercise induced bronchospasm: J45.990

## 2013-01-19 HISTORY — DX: Headache: R51

## 2013-01-19 HISTORY — DX: Acute embolism and thrombosis of unspecified deep veins of unspecified lower extremity: I82.409

## 2013-01-19 HISTORY — DX: Pneumonia, unspecified organism: J18.9

## 2013-01-19 HISTORY — DX: Type 2 diabetes mellitus without complications: E11.9

## 2013-01-19 HISTORY — DX: Acute myocardial infarction, unspecified: I21.9

## 2013-01-19 LAB — COMPREHENSIVE METABOLIC PANEL
ALT: 7 U/L (ref 0–35)
AST: 16 U/L (ref 0–37)
Albumin: 3.7 g/dL (ref 3.5–5.2)
Alkaline Phosphatase: 72 U/L (ref 39–117)
BILIRUBIN TOTAL: 0.3 mg/dL (ref 0.3–1.2)
BUN: 31 mg/dL — ABNORMAL HIGH (ref 6–23)
CO2: 21 meq/L (ref 19–32)
CREATININE: 2.71 mg/dL — AB (ref 0.50–1.10)
Calcium: 9.5 mg/dL (ref 8.4–10.5)
Chloride: 102 mEq/L (ref 96–112)
GFR calc Af Amer: 20 mL/min — ABNORMAL LOW (ref >90)
GFR calc non Af Amer: 17 mL/min — ABNORMAL LOW (ref >90)
GLUCOSE: 162 mg/dL — AB (ref 70–99)
Potassium: 4 mEq/L (ref 3.7–5.3)
Sodium: 143 mEq/L (ref 137–147)
Total Protein: 7.7 g/dL (ref 6.0–8.3)

## 2013-01-19 LAB — TROPONIN I: Troponin I: <0.3 ng/mL (ref <0.30)

## 2013-01-19 LAB — URINALYSIS, ROUTINE W REFLEX MICROSCOPIC
Glucose, UA: NEGATIVE mg/dL
HGB URINE DIPSTICK: NEGATIVE
Ketones, ur: 15 mg/dL — AB
NITRITE: NEGATIVE
PROTEIN: NEGATIVE mg/dL
UROBILINOGEN UA: 0.2 mg/dL (ref 0.0–1.0)
pH: 5 (ref 5.0–8.0)

## 2013-01-19 LAB — POCT I-STAT, CHEM 8
BUN: 37 mg/dL — ABNORMAL HIGH (ref 6–23)
CREATININE: 2.8 mg/dL — AB (ref 0.50–1.10)
Calcium, Ion: 1.14 mmol/L (ref 1.13–1.30)
Chloride: 105 mEq/L (ref 96–112)
Glucose, Bld: 164 mg/dL — ABNORMAL HIGH (ref 70–99)
HEMATOCRIT: 44 % (ref 36.0–46.0)
HEMOGLOBIN: 15 g/dL (ref 12.0–15.0)
Potassium: 4 mEq/L (ref 3.7–5.3)
SODIUM: 143 meq/L (ref 137–147)
TCO2: 23 mmol/L (ref 0–100)

## 2013-01-19 LAB — LACTIC ACID, PLASMA: Lactic Acid, Venous: 1 mmol/L (ref 0.5–2.2)

## 2013-01-19 LAB — CBC WITH DIFFERENTIAL/PLATELET
BASOS ABS: 0.1 10*3/uL (ref 0.0–0.1)
Basophils Relative: 1 % (ref 0–1)
Eosinophils Absolute: 0.2 10*3/uL (ref 0.0–0.7)
Eosinophils Relative: 2 % (ref 0–5)
HEMATOCRIT: 39.5 % (ref 36.0–46.0)
HEMOGLOBIN: 12.8 g/dL (ref 12.0–15.0)
LYMPHS PCT: 28 % (ref 12–46)
Lymphs Abs: 2.7 10*3/uL (ref 0.7–4.0)
MCH: 29.9 pg (ref 26.0–34.0)
MCHC: 32.4 g/dL (ref 30.0–36.0)
MCV: 92.3 fL (ref 78.0–100.0)
MONO ABS: 0.6 10*3/uL (ref 0.1–1.0)
Monocytes Relative: 6 % (ref 3–12)
Neutro Abs: 6.1 10*3/uL (ref 1.7–7.7)
Neutrophils Relative %: 63 % (ref 43–77)
Platelets: 413 10*3/uL — ABNORMAL HIGH (ref 150–400)
RBC: 4.28 MIL/uL (ref 3.87–5.11)
RDW: 14.3 % (ref 11.5–15.5)
WBC: 9.8 10*3/uL (ref 4.0–10.5)

## 2013-01-19 LAB — MRSA PCR SCREENING: MRSA by PCR: NEGATIVE

## 2013-01-19 LAB — BASIC METABOLIC PANEL
BUN: 30 mg/dL — ABNORMAL HIGH (ref 6–23)
CALCIUM: 8.3 mg/dL — AB (ref 8.4–10.5)
CO2: 18 mEq/L — ABNORMAL LOW (ref 19–32)
CREATININE: 2.86 mg/dL — AB (ref 0.50–1.10)
Chloride: 110 mEq/L (ref 96–112)
GFR calc non Af Amer: 16 mL/min — ABNORMAL LOW (ref >90)
GFR, EST AFRICAN AMERICAN: 18 mL/min — AB (ref >90)
Glucose, Bld: 85 mg/dL (ref 70–99)
Potassium: 5.1 mEq/L (ref 3.7–5.3)
Sodium: 143 mEq/L (ref 137–147)

## 2013-01-19 LAB — GLUCOSE, CAPILLARY
GLUCOSE-CAPILLARY: 85 mg/dL (ref 70–99)
Glucose-Capillary: 136 mg/dL — ABNORMAL HIGH (ref 70–99)

## 2013-01-19 LAB — URINE MICROSCOPIC-ADD ON

## 2013-01-19 LAB — POCT I-STAT TROPONIN I: Troponin i, poc: 0.01 ng/mL (ref 0.00–0.08)

## 2013-01-19 MED ORDER — INSULIN ASPART 100 UNIT/ML ~~LOC~~ SOLN: 0.0000 [IU] | Freq: Every day | SUBCUTANEOUS | Status: DC

## 2013-01-19 MED ORDER — ONDANSETRON HCL 4 MG/2ML IJ SOLN: 4.0000 mg | Freq: Four times a day (QID) | INTRAMUSCULAR | Status: DC | PRN

## 2013-01-19 MED ORDER — ENOXAPARIN SODIUM 30 MG/0.3ML ~~LOC~~ SOLN
30.0000 mg | SUBCUTANEOUS | Status: DC
  Administered 2013-01-20 – 2013-01-21 (×2): 30 mg via SUBCUTANEOUS
  Filled 2013-01-19 (×3): qty 0.3

## 2013-01-19 MED ORDER — ASPIRIN EC 81 MG PO TBEC
81.0000 mg | DELAYED_RELEASE_TABLET | Freq: Every day | ORAL | Status: DC
  Administered 2013-01-19 – 2013-01-22 (×4): 81 mg via ORAL
  Filled 2013-01-19 (×4): qty 1

## 2013-01-19 MED ORDER — INSULIN ASPART 100 UNIT/ML ~~LOC~~ SOLN
0.0000 [IU] | Freq: Three times a day (TID) | SUBCUTANEOUS | Status: DC
  Administered 2013-01-20 (×2): 2 [IU] via SUBCUTANEOUS

## 2013-01-19 MED ORDER — ONDANSETRON HCL 4 MG PO TABS: 4.0000 mg | ORAL_TABLET | Freq: Four times a day (QID) | ORAL | Status: DC | PRN

## 2013-01-19 MED ORDER — ONDANSETRON HCL 4 MG/2ML IJ SOLN
4.0000 mg | Freq: Once | INTRAMUSCULAR | Status: AC
  Administered 2013-01-19: 4 mg via INTRAVENOUS
  Filled 2013-01-19: qty 2

## 2013-01-19 MED ORDER — SODIUM CHLORIDE 0.9 % IV SOLN: INTRAVENOUS | Status: AC

## 2013-01-19 MED ORDER — HEPARIN BOLUS VIA INFUSION: 4000.0000 [IU] | Freq: Once | INTRAVENOUS | Status: DC

## 2013-01-19 MED ORDER — SODIUM CHLORIDE 0.9 % IV BOLUS (SEPSIS)
1000.0000 mL | Freq: Once | INTRAVENOUS | Status: AC
  Administered 2013-01-19: 1000 mL via INTRAVENOUS

## 2013-01-19 MED ORDER — CYCLOBENZAPRINE HCL 10 MG PO TABS
10.0000 mg | ORAL_TABLET | Freq: Three times a day (TID) | ORAL | Status: DC
Start: 2013-01-19 — End: 2013-01-22
  Administered 2013-01-19 – 2013-01-22 (×9): 10 mg via ORAL
  Filled 2013-01-19 (×12): qty 1

## 2013-01-19 MED ORDER — FERROUS SULFATE 325 (65 FE) MG PO TABS
325.0000 mg | ORAL_TABLET | Freq: Three times a day (TID) | ORAL | Status: DC
  Administered 2013-01-19 – 2013-01-22 (×11): 325 mg via ORAL
  Filled 2013-01-19 (×14): qty 1

## 2013-01-19 MED ORDER — PANTOPRAZOLE SODIUM 40 MG PO TBEC
40.0000 mg | DELAYED_RELEASE_TABLET | Freq: Every day | ORAL | Status: DC
  Administered 2013-01-19 – 2013-01-22 (×3): 40 mg via ORAL
  Filled 2013-01-19 (×3): qty 1

## 2013-01-19 MED ORDER — CLOPIDOGREL BISULFATE 75 MG PO TABS
75.0000 mg | ORAL_TABLET | Freq: Every day | ORAL | Status: DC
  Administered 2013-01-19 – 2013-01-22 (×4): 75 mg via ORAL
  Filled 2013-01-19 (×4): qty 1

## 2013-01-19 MED ORDER — TECHNETIUM TC 99M DIETHYLENETRIAME-PENTAACETIC ACID: 40.0000 | Freq: Once | INTRAVENOUS | Status: AC | PRN

## 2013-01-19 MED ORDER — ENOXAPARIN SODIUM 60 MG/0.6ML ~~LOC~~ SOLN
1.0000 mg/kg | SUBCUTANEOUS | Status: DC
  Administered 2013-01-19: 55 mg via SUBCUTANEOUS
  Filled 2013-01-19 (×2): qty 0.6

## 2013-01-19 MED ORDER — ENOXAPARIN SODIUM 100 MG/ML ~~LOC~~ SOLN
60.0000 mg | Freq: Once | SUBCUTANEOUS | Status: AC
  Administered 2013-01-19: 60 mg via SUBCUTANEOUS
  Filled 2013-01-19: qty 1

## 2013-01-19 MED ORDER — ATORVASTATIN CALCIUM 20 MG PO TABS
20.0000 mg | ORAL_TABLET | Freq: Every day | ORAL | Status: DC
  Administered 2013-01-19 – 2013-01-22 (×4): 20 mg via ORAL
  Filled 2013-01-19 (×4): qty 1

## 2013-01-19 MED ORDER — HEPARIN (PORCINE) IN NACL 100-0.45 UNIT/ML-% IJ SOLN: 12.0000 [IU]/kg/h | INTRAMUSCULAR | Status: DC

## 2013-01-19 MED ORDER — SODIUM CHLORIDE 0.9 % IV BOLUS (SEPSIS)
500.0000 mL | Freq: Once | INTRAVENOUS | Status: AC
  Administered 2013-01-19: 15:00:00 500 mL via INTRAVENOUS

## 2013-01-19 MED ORDER — SODIUM CHLORIDE 0.9 % IV SOLN
INTRAVENOUS | Status: AC
  Administered 2013-01-19: 08:00:00 via INTRAVENOUS

## 2013-01-19 MED ORDER — ONDANSETRON HCL 4 MG/2ML IJ SOLN: 4.0000 mg | Freq: Three times a day (TID) | INTRAMUSCULAR | Status: AC | PRN

## 2013-01-19 MED ORDER — FLUOXETINE HCL 10 MG PO CAPS
10.0000 mg | ORAL_CAPSULE | Freq: Every day | ORAL | Status: DC
  Administered 2013-01-19 – 2013-01-22 (×4): 10 mg via ORAL
  Filled 2013-01-19 (×4): qty 1

## 2013-01-19 MED ORDER — TECHNETIUM TO 99M ALBUMIN AGGREGATED
6.0000 | Freq: Once | INTRAVENOUS | Status: AC | PRN
  Administered 2013-01-19: 6 via INTRAVENOUS

## 2013-01-19 NOTE — H&P (Signed)
PCP:   [name], MD   Chief Complaint:  Almost passed out and chest pain  HPI: 68 yo female h/o cva with rt sided hemiparesis partial, htn, h/o dvt several years ago was going to the bathroom tonight with the help of her husband when she all of a sudden got very weak and almost passed out, and started having chest pain but no LOC.  Pt now in ED, says the chest pain went away with nothing.  Her heart rate was in the 140s sinus tachycardia and she says her heart rate is always that fast.  She denies any fevers.  No sob.  No n/v/d.  She has not been eating well or drinking well.  Denies abd pain or dysuria or hematuria.  No bleeding from anywhere.  She denies any focal neurological deficits that are different than her chronic rt sided weakness.  No le swelling or edema over the last several weeks.  No uri symptoms.  Pt feeling her baseline right now.  Concerns are for possible PE, and her worsening renal failure.  Review of Systems:  Positive and negative as per HPI otherwise all other systems are negative  Past Medical History: Past Medical History  Diagnosis Date  . Diabetes mellitus without complication   . Coronary artery disease   . Stroke 2005    R sided deficits  . Asthma    Past Surgical History  Procedure Laterality Date  . Tubal ligation      Medications: Prior to Admission medications   Medication Sig Start Date End Date Taking? Authorizing Provider  aspirin EC 81 MG EC tablet Take 1 tablet (81 mg total) by mouth daily. 04/07/12  Yes Robynn Pane, MD  atorvastatin (LIPITOR) 20 MG tablet Take 20 mg by mouth daily.   Yes Historical Provider, MD  clopidogrel (PLAVIX) 75 MG tablet Take 75 mg by mouth daily.   Yes Historical Provider, MD  cyclobenzaprine (FLEXERIL) 10 MG tablet Take 10 mg by mouth 3 (three) times daily.   Yes Historical Provider, MD  ferrous sulfate 325 (65 FE) MG tablet Take 1 tablet (325 mg total) by mouth 3 (three) times daily with meals. 04/07/12  Yes Robynn Pane, MD  FLUoxetine (PROZAC) 10 MG capsule Take 10 mg by mouth daily.   Yes Historical Provider, MD  lisinopril (PRINIVIL,ZESTRIL) 5 MG tablet Take 5 mg by mouth daily.   Yes Historical Provider, MD  magnesium hydroxide (MILK OF MAGNESIA) 400 MG/5ML suspension Take 15 mLs by mouth daily as needed for mild constipation.   Yes Historical Provider, MD  metFORMIN (GLUCOPHAGE) 500 MG tablet Take 500 mg by mouth 2 (two) times daily with a meal.   Yes Historical Provider, MD  nitrofurantoin (MACRODANTIN) 100 MG capsule Take 100 mg by mouth every 12 (twelve) hours.   Yes Historical Provider, MD  pantoprazole (PROTONIX) 40 MG tablet Take 1 tablet (40 mg total) by mouth daily at 6 (six) AM. 04/07/12  Yes Robynn Pane, MD  nitroGLYCERIN (NITROSTAT) 0.4 MG SL tablet Place 1 tablet (0.4 mg total) under the tongue every 5 (five) minutes x 3 doses as needed for chest pain. 04/07/12   Robynn Pane, MD    Allergies:   Allergies  Allergen Reactions  . Penicillins Nausea And Vomiting    Social History:  reports that she has quit smoking. She does not have any smokeless tobacco history on file. She reports that she does not drink alcohol or use illicit drugs.  Family History: none  Physical Exam: Filed Vitals:   01/19/13 0237 01/19/13 0248 01/19/13 0408 01/19/13 0445  BP: 119/69  94/68 99/68  Pulse: 137  130 130  Temp: 97.9 F (36.6 C) 98.1 F (36.7 C)    TempSrc:  Rectal    Resp: 23  20 18   SpO2: 97%  96% 95%   General appearance: alert, cooperative and no distress Head: Normocephalic, without obvious abnormality, atraumatic Eyes: negative Nose: Nares normal. Septum midline. Mucosa normal. No drainage or sinus tenderness. Neck: no JVD and supple, symmetrical, trachea midline Lungs: clear to auscultation bilaterally Heart: regular rate and rhythm mildly tachycardic rate 115 now Abdomen: soft, non-tender; bowel sounds normal; no masses,  no organomegaly Extremities: extremities normal,  atraumatic, no cyanosis or edema Pulses: 2+ and symmetric Skin: Skin color, texture, turgor normal. No rashes or lesions Neurologic: Mental status: Alert, oriented, thought content appropriate Cranial nerves: normal 3/5 strenght rue/rle old   Labs on Admission:   Recent Labs  01/19/13 0300 01/19/13 0315  NA 143 143  K 4.0 4.0  CL 102 105  CO2 21  --   GLUCOSE 162* 164*  BUN 31* 37*  CREATININE 2.71* 2.80*  CALCIUM 9.5  --     Recent Labs  01/19/13 0300  AST 16  ALT 7  ALKPHOS 72  BILITOT 0.3  PROT 7.7  ALBUMIN 3.7    Recent Labs  01/19/13 0300 01/19/13 0315  WBC 9.8  --   NEUTROABS 6.1  --   HGB 12.8 15.0  HCT 39.5 44.0  MCV 92.3  --   PLT 413*  --     Radiological Exams on Admission: Ct Head Wo Contrast  01/19/2013   CLINICAL DATA:  Near syncope, severe headache  EXAM: CT HEAD WITHOUT CONTRAST  TECHNIQUE: Contiguous axial images were obtained from the base of the skull through the vertex without intravenous contrast.  COMPARISON:  Prior MRI from 09/09/2010 and CT from 09/06/2010  FINDINGS: Diffuse prominence of the CSF containing spaces is compatible with generalized cerebral atrophy, advanced as compared to most recent CT from 09/06/2010. Scattered and confluent hypodensity within the periventricular and deep white matter is consistent with chronic microvascular ischemic disease, also progressed.  Encephalomalacia within the medial left occipital lobe is compatible with remote left PCA territory infarct. There is somewhat more ill-defined hypodensity within this region is well, indeterminate. Bilateral lacunar infarcts noted within the thalami bilaterally. No acute intracranial hemorrhage identified. No definite large vessel territory infarct. No mass lesion or midline shift. Ventricles are within normal limits without evidence hydrocephalus. No extra-axial fluid collection.  Calvarium is intact.  Orbits are normal.  Paranasal sinuses and mastoid air cells are  clear.  IMPRESSION: 1. No definite acute intracranial abnormality. 2. Hypodensity and encephalomalacia within the medial left occipital lobe, most compatible with remote left PCA territory infarct. While this finding appears largely chronic in nature, possible superimposed acute ischemia is not excluded. Further evaluation with MRI is recommended if there is concern for acute infarct involving this vascular territory. 3. Remote lacunar infarcts within the bilateral thalami. 4. Moderate cerebral atrophy with chronic microvascular ischemic changes, progressed as compared to prior CT from 09/17/2010.   Electronically Signed   By: Rise Mu M.D.   On: 01/19/2013 03:30   Dg Chest Port 1 View  01/19/2013   CLINICAL DATA:  Shortness of breath  EXAM: PORTABLE CHEST - 1 VIEW  COMPARISON:  04/04/2012  FINDINGS: Stable borderline cardiomegaly. No change in mediastinal  contours. No evidence of consolidation or edema. No effusion or pneumothorax. No acute osseous findings.  IMPRESSION: No active disease.   Electronically Signed   By: Tiburcio PeaJonathan  Watts M.D.   On: 01/19/2013 03:20    Assessment/Plan  68 yo female with sudden chest pain, presyncope, sinus tachycardia with h/o dvt and worsening renal failure  Principal Problem:   Chest pain-  No cp now.  On plavix/asa.  High risk for PE.  Cover empirically with lovenox.  Order vq scan due to acute renal failure.  Last echo recent ef 45%.  Serial enzymes.  Place in stepdown for concerms of PE.  Active Problems:   Diabetes mellitus without complication stable  Hold ace and metformin   Stroke  stable   Coronary artery disease  Serial enzymes   Tachycardia  As above   Weakness generalized  Probably multifactorial   H/O dvt of lower extremity  noted   Acute renal failure  bp is soft.  Getting one liter ivf bolus, will repeat bmp later this afternoon around 1pm to make sure improving.  ua no infection  Will also ck orthostatics.    [name]  A 01/19/2013, 5:50 AM

## 2013-01-19 NOTE — ED Notes (Signed)
The staff for VQ scan will not be in until 0630, EDP Hyacinth MeekerMiller is ok with waiting for scan

## 2013-01-19 NOTE — ED Notes (Signed)
Patient transported to CT 

## 2013-01-19 NOTE — ED Notes (Signed)
Pt from home. Husband assisted pt to the bathroom approximately an hour prior to EMS arrival. Pt had a bowel movement and had a near syncopal episode. Pt has a history of afib and EMS called. Pt's heart rate in the 140's upon EMS arrival, dizzy, and light headed. Pt has a history of a CVA with aphasia and weakness deficits from the CVA. Pt's stool black, but pt is on iron supplements.

## 2013-01-19 NOTE — Progress Notes (Signed)
[facility] Progress Note [place] TEAM 1 - Stepdown/ICU TEAM   [name] AVW:[id number] DOB: Mar 13, 1945 DOA: 01/19/2013 PCP: August SaucerEAN, [name], MD  Brief narrative: 68 yo female h/o CVA with rt sided hemiparesis partial, HTN, h/o DVT, DM several years ago was going to the bathroo with the help of her husband when she all of a sudden got very weak and almost passed out- no LOC. She started having chest pain which resolved on its own. When asking where her pain was, she points to the left upper chest (near the shoulder). No c/o vomiting or diarrhea but apparently has had a poor appetite lately.    Subjective: No complaint.   Assessment/Plan: Principal Problem:   Chest pain/CAD - resolved- cardiac enzymes negative - VQ also negative   Active Problems: ARF - prerenal- severe dehydration- pt without urine output all day - bolus NS and increase IVF  Chronic systolic CHF-  - EF 40-45% on 8/294/14 with focal hypokinesia - follow resp status with hydration  Tachycardia -due to dehydration- no PE noted on VQ    Diabetes mellitus without complication - start sliding scale insulin- hold Metformin  CVA - with dysarthria and right sided weakness - cont ASA 81 mg    H/O dvt of lower extremity   Code Status: Full code Family Communication: none Disposition Plan: follow in SDu  Consultants: none  Procedures: none  Antibiotics: none  DVT prophylaxis: Lovenox  Objective: Blood pressure 113/70, pulse 114, temperature 97.8 F (36.6 C), temperature source Oral, resp. rate 18, height 5\' 4"  (1.626 m), weight 54 kg (119 lb 0.8 oz), SpO2 98.00%.  Intake/Output Summary (Last 24 hours) at 01/19/13 1652 Last data filed at 01/19/13 1500  Gross per 24 hour  Intake      0 ml  Output      1 ml  Net     -1 ml     Exam: General: No acute respiratory distress Lungs: Clear to auscultation bilaterally without wheezes or crackles Cardiovascular: Regular rate and rhythm- tahcycaric-   without murmur Abdomen: Nontender, nondistended, soft, bowel sounds positive, no rebound, no ascites, no appreciable mass Extremities: No significant cyanosis, clubbing, or edema bilateral lower extremities-  Data Reviewed: Basic Metabolic Panel:  Recent Labs Lab 01/19/13 0300 01/19/13 0315 01/19/13 1400  NA 143 143 143  K 4.0 4.0 5.1  CL 102 105 110  CO2 21  --  18*  GLUCOSE 162* 164* 85  BUN 31* 37* 30*  CREATININE 2.71* 2.80* 2.86*  CALCIUM 9.5  --  8.3*   Liver Function Tests:  Recent Labs Lab 01/19/13 0300  AST 16  ALT 7  ALKPHOS 72  BILITOT 0.3  PROT 7.7  ALBUMIN 3.7   No results found for this basename: LIPASE, AMYLASE,  in the last 168 hours No results found for this basename: AMMONIA,  in the last 168 hours CBC:  Recent Labs Lab 01/19/13 0300 01/19/13 0315  WBC 9.8  --   NEUTROABS 6.1  --   HGB 12.8 15.0  HCT 39.5 44.0  MCV 92.3  --   PLT 413*  --    Cardiac Enzymes:  Recent Labs Lab 01/19/13 0820 01/19/13 1400  TROPONINI <0.30 <0.30   BNP (last 3 results) No results found for this basename: PROBNP,  in the last 8760 hours CBG: No results found for this basename: GLUCAP,  in the last 168 hours  Recent Results (from the past 240 hour(s))  MRSA PCR SCREENING  Status: None   Collection Time    01/19/13  7:17 AM      Result Value Range Status   MRSA by PCR NEGATIVE  NEGATIVE Final   Comment:            The GeneXpert MRSA Assay (FDA     approved for NASAL specimens     only), is one component of a     comprehensive MRSA colonization     surveillance program. It is not     intended to diagnose MRSA     infection nor to guide or     monitor treatment for     MRSA infections.     Studies:  Recent x-ray studies have been reviewed in detail by the Attending Physician  Scheduled Meds:  Scheduled Meds: . sodium chloride   Intravenous STAT  . aspirin EC  81 mg Oral Daily  . atorvastatin  20 mg Oral Daily  . clopidogrel  75 mg  Oral Daily  . cyclobenzaprine  10 mg Oral TID  . [START ON 01/20/2013] enoxaparin (LOVENOX) injection  30 mg Subcutaneous Q24H  . ferrous sulfate  325 mg Oral TID WC  . FLUoxetine  10 mg Oral Daily  . pantoprazole  40 mg Oral Q0600   Continuous Infusions: . sodium chloride 75 mL/hr at 01/19/13 0750    Time spent on care of this patient: >35 min   Calvert Cantor, MD  [facility] Office  [phone] Pager - Text Page per Loretha Stapler as per below:  On-Call/Text Page:      [email or web address]      password [email or web address]  If 7PM-7AM, please contact night-coverage [email or web address] Password [email or web address] 01/19/2013, 4:52 PM   LOS: 0 days

## 2013-01-19 NOTE — Progress Notes (Signed)
Dr Butler Denmarkizwan notified of pt no urine output since shift start, offered bedpan to pt ,unable to urinate. Bladder scan result 186 cc. IV bolus 500 cc given per Dr Butler Denmarkizwan. Kept  Pt on monitor.

## 2013-01-19 NOTE — ED Notes (Addendum)
Cheryl from [facility] reports they need an medication to be sent here from an outside facility for test and should be here approx 0930, they will get pt from her assigned room

## 2013-01-19 NOTE — ED Provider Notes (Signed)
CSN: [id number]     Arrival date & time 01/19/13  0226 History   First MD Initiated Contact with Patient 01/19/13 0232     Chief Complaint  Patient presents with  . Near Syncope   (Consider location/radiation/quality/duration/timing/severity/associated sxs/prior Treatment) HPI Comments: 68 year-old female, history of stroke, diabetes, asthma. She she also has coronary disease. She presents after having a syncopal episode this evening, husband states that he was walking with her, she became very weak diffusely, fell to the ground and had several episodes of vomiting. She denies headache, chest pain, palpitations, abdominal pain, diarrhea though there have been some dark colored stools. These symptoms resolved spontaneously, the paramedics noted that the patient was able to stand up with their assistance, has been persistently tachycardic since that time. She does have a history of possible atrial fibrillation though the family is unsure.  The history is provided by the patient, the spouse and the EMS personnel.    Past Medical History  Diagnosis Date  . Diabetes mellitus without complication   . Coronary artery disease   . Stroke 2005    R sided deficits  . Asthma    Past Surgical History  Procedure Laterality Date  . Tubal ligation     No family history on file. History  Substance Use Topics  . Smoking status: Former Games developermoker  . Smokeless tobacco: Not on file  . Alcohol Use: No   OB History   Grav Para Term Preterm Abortions TAB SAB Ect Mult Living                 Review of Systems  All other systems reviewed and are negative.    Allergies  Penicillins  Home Medications   Current Outpatient Rx  Name  Route  Sig  Dispense  Refill  . aspirin EC 81 MG EC tablet   Oral   Take 1 tablet (81 mg total) by mouth daily.   30 tablet   3   . atorvastatin (LIPITOR) 20 MG tablet   Oral   Take 20 mg by mouth daily.         . clopidogrel (PLAVIX) 75 MG tablet   Oral  Take 75 mg by mouth daily.         . cyclobenzaprine (FLEXERIL) 10 MG tablet   Oral   Take 10 mg by mouth 3 (three) times daily.         . ferrous sulfate 325 (65 FE) MG tablet   Oral   Take 1 tablet (325 mg total) by mouth 3 (three) times daily with meals.   90 tablet   3   . FLUoxetine (PROZAC) 10 MG capsule   Oral   Take 10 mg by mouth daily.         Marland Kitchen. lisinopril (PRINIVIL,ZESTRIL) 5 MG tablet   Oral   Take 5 mg by mouth daily.         . magnesium hydroxide (MILK OF MAGNESIA) 400 MG/5ML suspension   Oral   Take 15 mLs by mouth daily as needed for mild constipation.         . metFORMIN (GLUCOPHAGE) 500 MG tablet   Oral   Take 500 mg by mouth 2 (two) times daily with a meal.         . nitrofurantoin (MACRODANTIN) 100 MG capsule   Oral   Take 100 mg by mouth every 12 (twelve) hours.         . pantoprazole (PROTONIX) 40  MG tablet   Oral   Take 1 tablet (40 mg total) by mouth daily at 6 (six) AM.   30 tablet   3   . nitroGLYCERIN (NITROSTAT) 0.4 MG SL tablet   Sublingual   Place 1 tablet (0.4 mg total) under the tongue every 5 (five) minutes x 3 doses as needed for chest pain.   25 tablet   3    BP 99/68  Pulse 130  Temp(Src) 98.1 F (36.7 C) (Rectal)  Resp 18  SpO2 95% Physical Exam  Nursing note and vitals reviewed. Constitutional: She appears well-developed and well-nourished. No distress.  HENT:  Head: Normocephalic and atraumatic.  Mouth/Throat: Oropharynx is clear and moist. No oropharyngeal exudate.  Eyes: Conjunctivae and EOM are normal. Pupils are equal, round, and reactive to light. Right eye exhibits no discharge. Left eye exhibits no discharge. No scleral icterus.  Neck: Normal range of motion. Neck supple. No JVD present. No thyromegaly present.  Cardiovascular: Regular rhythm, normal heart sounds and intact distal pulses.  Exam reveals no gallop and no friction rub.   No murmur heard. Regular tachycardia at 144 beats per minute,  occasional ectopy, weak pulses at the radial arteries  Pulmonary/Chest: Effort normal and breath sounds normal. No respiratory distress. She has no wheezes. She has no rales.  Abdominal: Soft. Bowel sounds are normal. She exhibits no distension and no mass. There is no tenderness.  Musculoskeletal: Normal range of motion. She exhibits no edema and no tenderness.  Lymphadenopathy:    She has no cervical adenopathy.  Neurological: She is alert. Coordination normal.  The patient is able to sit up by herself, she has clear speech, is able to answer questions appropriately.  Skin: Skin is warm and dry. No rash noted. No erythema.  Psychiatric: She has a normal mood and affect. Her behavior is normal.    ED Course  Procedures (including critical care time) Labs Review Labs Reviewed  CBC WITH DIFFERENTIAL - Abnormal; Notable for the following:    Platelets 413 (*)    All other components within normal limits  COMPREHENSIVE METABOLIC PANEL - Abnormal; Notable for the following:    Glucose, Bld 162 (*)    BUN 31 (*)    Creatinine, Ser 2.71 (*)    GFR calc non Af Amer 17 (*)    GFR calc Af Amer 20 (*)    All other components within normal limits  URINALYSIS, ROUTINE W REFLEX MICROSCOPIC - Abnormal; Notable for the following:    APPearance CLOUDY (*)    Specific Gravity, Urine >1.030 (*)    Bilirubin Urine SMALL (*)    Ketones, ur 15 (*)    Leukocytes, UA TRACE (*)    All other components within normal limits  URINE MICROSCOPIC-ADD ON - Abnormal; Notable for the following:    Casts HYALINE CASTS (*)    All other components within normal limits  POCT I-STAT, CHEM 8 - Abnormal; Notable for the following:    BUN 37 (*)    Creatinine, Ser 2.80 (*)    Glucose, Bld 164 (*)    All other components within normal limits  POCT I-STAT TROPONIN I   Imaging Review Ct Head Wo Contrast  01/19/2013   CLINICAL DATA:  Near syncope, severe headache  EXAM: CT HEAD WITHOUT CONTRAST  TECHNIQUE:  Contiguous axial images were obtained from the base of the skull through the vertex without intravenous contrast.  COMPARISON:  Prior MRI from 09/09/2010 and CT from 09/06/2010  FINDINGS: Diffuse prominence of the CSF containing spaces is compatible with generalized cerebral atrophy, advanced as compared to most recent CT from 09/06/2010. Scattered and confluent hypodensity within the periventricular and deep white matter is consistent with chronic microvascular ischemic disease, also progressed.  Encephalomalacia within the medial left occipital lobe is compatible with remote left PCA territory infarct. There is somewhat more ill-defined hypodensity within this region is well, indeterminate. Bilateral lacunar infarcts noted within the thalami bilaterally. No acute intracranial hemorrhage identified. No definite large vessel territory infarct. No mass lesion or midline shift. Ventricles are within normal limits without evidence hydrocephalus. No extra-axial fluid collection.  Calvarium is intact.  Orbits are normal.  Paranasal sinuses and mastoid air cells are clear.  IMPRESSION: 1. No definite acute intracranial abnormality. 2. Hypodensity and encephalomalacia within the medial left occipital lobe, most compatible with remote left PCA territory infarct. While this finding appears largely chronic in nature, possible superimposed acute ischemia is not excluded. Further evaluation with MRI is recommended if there is concern for acute infarct involving this vascular territory. 3. Remote lacunar infarcts within the bilateral thalami. 4. Moderate cerebral atrophy with chronic microvascular ischemic changes, progressed as compared to prior CT from 09/17/2010.   Electronically Signed   By: Rise Mu M.D.   On: 01/19/2013 03:30   Dg Chest Port 1 View  01/19/2013   CLINICAL DATA:  Shortness of breath  EXAM: PORTABLE CHEST - 1 VIEW  COMPARISON:  04/04/2012  FINDINGS: Stable borderline cardiomegaly. No change in  mediastinal contours. No evidence of consolidation or edema. No effusion or pneumothorax. No acute osseous findings.  IMPRESSION: No active disease.   Electronically Signed   By: Tiburcio Pea M.D.   On: 01/19/2013 03:20    EKG Interpretation   None       MDM   1. Syncope   2. Tachycardia   3. Diabetes mellitus without complication   4. Stroke   5. Coronary artery disease    Unknown etiology of the patient's syncopal episode, will need further evaluation, consider cardiac neurologic electrolyte infectious etiologies.  ED ECG REPORT  I personally interpreted this EKG   Date: 01/19/2013   Rate: 141  Rhythm: sinus tachycardia  QRS Axis: normal  Intervals: normal  ST/T Wave abnormalities: nonspecific T wave changes  Conduction Disutrbances:none  Narrative Interpretation: electrical noise, TWI from prior ECG resolved  Old EKG Reviewed: none available  The patient has had persistent significant tachycardia, she now has a soft blood pressure at 99 systolic, IV fluid bolus ordered, anticoagulation ordered as well as the patient now states that she has a history of DVT. I would consider PE to be on the differential, V/Q scan ordered for the morning, discussed with hospitalist to admit. No fever, no significant hypoxia, no UTI, no leukocytosis  CRITICAL CARE Performed by: Vida Roller Total critical care time: 35 Critical care time was exclusive of separately billable procedures and treating other patients. Critical care was necessary to treat or prevent imminent or life-threatening deterioration. Critical care was time spent personally by me on the following activities: development of treatment plan with patient and/or surrogate as well as nursing, discussions with consultants, evaluation of patient's response to treatment, examination of patient, obtaining history from patient or surrogate, ordering and performing treatments and interventions, ordering and review of laboratory  studies, ordering and review of radiographic studies, pulse oximetry and re-evaluation of patient's condition.   Vida Roller, MD 01/19/13 [phone]

## 2013-01-20 DIAGNOSIS — N179 Acute kidney failure, unspecified: Secondary | ICD-10-CM

## 2013-01-20 LAB — BASIC METABOLIC PANEL
BUN: 24 mg/dL — ABNORMAL HIGH (ref 6–23)
CALCIUM: 7.9 mg/dL — AB (ref 8.4–10.5)
CO2: 17 mEq/L — ABNORMAL LOW (ref 19–32)
Chloride: 114 mEq/L — ABNORMAL HIGH (ref 96–112)
Creatinine, Ser: 2.52 mg/dL — ABNORMAL HIGH (ref 0.50–1.10)
GFR calc Af Amer: 21 mL/min — ABNORMAL LOW (ref >90)
GFR, EST NON AFRICAN AMERICAN: 18 mL/min — AB (ref >90)
GLUCOSE: 112 mg/dL — AB (ref 70–99)
Potassium: 5 mEq/L (ref 3.7–5.3)
SODIUM: 144 meq/L (ref 137–147)

## 2013-01-20 LAB — CBC
HCT: 31 % — ABNORMAL LOW (ref 36.0–46.0)
Hemoglobin: 9.8 g/dL — ABNORMAL LOW (ref 12.0–15.0)
MCH: 29.2 pg (ref 26.0–34.0)
MCHC: 31.6 g/dL (ref 30.0–36.0)
MCV: 92.3 fL (ref 78.0–100.0)
PLATELETS: 313 10*3/uL (ref 150–400)
RBC: 3.36 MIL/uL — ABNORMAL LOW (ref 3.87–5.11)
RDW: 14.3 % (ref 11.5–15.5)
WBC: 5.8 10*3/uL (ref 4.0–10.5)

## 2013-01-20 LAB — GLUCOSE, CAPILLARY
Glucose-Capillary: 71 mg/dL (ref 70–99)
Glucose-Capillary: 120 mg/dL — ABNORMAL HIGH (ref 70–99)
Glucose-Capillary: 122 mg/dL — ABNORMAL HIGH (ref 70–99)
Glucose-Capillary: 123 mg/dL — ABNORMAL HIGH (ref 70–99)

## 2013-01-20 MED ORDER — SODIUM CHLORIDE 0.9 % IV SOLN
INTRAVENOUS | Status: DC
  Administered 2013-01-20 – 2013-01-21 (×5): via INTRAVENOUS

## 2013-01-20 NOTE — Progress Notes (Signed)
[facility] Progress Note [place] TEAM 1 - Stepdown/ICU TEAM   [name] XBJ:[id number] DOB: [birth year] DOA: 01/19/2013 PCP: [name], MD  Brief narrative: 68 yo female h/o CVA with rt sided hemiparesis partial, HTN, h/o DVT, DM several years ago was going to the bathroo with the help of her husband when she all of a sudden got very weak and almost passed out- no LOC. She started having chest pain which resolved on its own. When asking where her pain was, she points to the left upper chest (near the shoulder). No c/o vomiting or diarrhea but apparently has had a poor appetite lately.    Subjective: No complaints. Sitting up a chair. States she feels better.   Assessment/Plan: Principal Problem:   Chest pain/CAD - resolved- cardiac enzymes negative - VQ also negative   Active Problems: ARF - prerenal- severe dehydration with no urine output for > 18 hrs after admission - Cr slowly improving with aggressive hydration  Anorexia - very poor PO intake for weeks prior to admission- follow carefully   Chronic systolic CHF-  - EF 40-45% on 3/08 with focal hypokinesia - follow resp status with hydration  Tachycardia -due to dehydration and steadily improving - no PE noted on VQ    Diabetes mellitus without complication - start sliding scale insulin- hold Metformin  CVA - with dysarthria and right sided weakness - cont ASA 81 mg    H/O dvt of lower extremity   Code Status: Full code Family Communication: detailed discussion with husband at bedside Disposition Plan: transfer to med/surg  Consultants: none  Procedures: none  Antibiotics: none  DVT prophylaxis: Lovenox  Objective: Blood pressure 116/67, pulse 101, temperature 98.5 F (36.9 C), temperature source Oral, resp. rate 18, height 5\' 4"  (1.626 m), weight 54.1 kg (119 lb 4.3 oz), SpO2 96.00%.  Intake/Output Summary (Last 24 hours) at 01/20/13 1637 Last data filed at 01/20/13 1330  Gross per 24  hour  Intake   1530 ml  Output    350 ml  Net   1180 ml     Exam: General: No acute respiratory distress- dysarthria noted Lungs: Clear to auscultation bilaterally without wheezes or crackles Cardiovascular: Regular rate and rhythm- tahcycaric-  without murmur Abdomen: Nontender, nondistended, soft, bowel sounds positive, no rebound, no ascites, no appreciable mass Extremities: No significant cyanosis, clubbing, or edema bilateral lower extremities-  Data Reviewed: Basic Metabolic Panel:  Recent Labs Lab 01/19/13 0300 01/19/13 0315 01/19/13 1400 01/20/13 0345  NA 143 143 143 144  K 4.0 4.0 5.1 5.0  CL 102 105 110 114*  CO2 21  --  18* 17*  GLUCOSE 162* 164* 85 112*  BUN 31* 37* 30* 24*  CREATININE 2.71* 2.80* 2.86* 2.52*  CALCIUM 9.5  --  8.3* 7.9*   Liver Function Tests:  Recent Labs Lab 01/19/13 0300  AST 16  ALT 7  ALKPHOS 72  BILITOT 0.3  PROT 7.7  ALBUMIN 3.7   No results found for this basename: LIPASE, AMYLASE,  in the last 168 hours No results found for this basename: AMMONIA,  in the last 168 hours CBC:  Recent Labs Lab 01/19/13 0300 01/19/13 0315 01/20/13 0345  WBC 9.8  --  5.8  NEUTROABS 6.1  --   --   HGB 12.8 15.0 9.8*  HCT 39.5 44.0 31.0*  MCV 92.3  --  92.3  PLT 413*  --  313   Cardiac Enzymes:  Recent Labs Lab 01/19/13 0820 01/19/13 1400  01/19/13 1815  TROPONINI <0.30 <0.30 <0.30   BNP (last 3 results) No results found for this basename: PROBNP,  in the last 8760 hours CBG:  Recent Labs Lab 01/19/13 1727 01/19/13 2109 01/20/13 0808 01/20/13 1241  GLUCAP 85 136* 71 120*    Recent Results (from the past 240 hour(s))  MRSA PCR SCREENING     Status: None   Collection Time    01/19/13  7:17 AM      Result Value Range Status   MRSA by PCR NEGATIVE  NEGATIVE Final   Comment:            The GeneXpert MRSA Assay (FDA     approved for NASAL specimens     only), is one component of a     comprehensive MRSA colonization      surveillance program. It is not     intended to diagnose MRSA     infection nor to guide or     monitor treatment for     MRSA infections.     Studies:  Recent x-ray studies have been reviewed in detail by the Attending Physician  Scheduled Meds:  Scheduled Meds: . aspirin EC  81 mg Oral Daily  . atorvastatin  20 mg Oral Daily  . clopidogrel  75 mg Oral Daily  . cyclobenzaprine  10 mg Oral TID  . enoxaparin (LOVENOX) injection  30 mg Subcutaneous Q24H  . ferrous sulfate  325 mg Oral TID WC  . FLUoxetine  10 mg Oral Daily  . insulin aspart  0-15 Units Subcutaneous TID WC  . insulin aspart  0-5 Units Subcutaneous QHS  . pantoprazole  40 mg Oral Q0600   Continuous Infusions: . sodium chloride 125 mL/hr at 01/20/13 1045    Time spent on care of this patient: 25 min   [name] CantorIZWAN,Baruc Tugwell, MD  [facility] Office  [phone] Pager - Text Page per Loretha StaplerAmion as per below:  On-Call/Text Page:      [email or web address]      password [email or web address]  If 7PM-7AM, please contact night-coverage [email or web address] Password [email or web address] 01/20/2013, 4:37 PM   LOS: 1 day

## 2013-01-21 LAB — GLUCOSE, CAPILLARY
GLUCOSE-CAPILLARY: 86 mg/dL (ref 70–99)
GLUCOSE-CAPILLARY: 83 mg/dL (ref 70–99)
GLUCOSE-CAPILLARY: 88 mg/dL (ref 70–99)
Glucose-Capillary: 101 mg/dL — ABNORMAL HIGH (ref 70–99)

## 2013-01-21 LAB — BASIC METABOLIC PANEL
BUN: 16 mg/dL (ref 6–23)
CALCIUM: 7.7 mg/dL — AB (ref 8.4–10.5)
CO2: 19 mEq/L (ref 19–32)
Chloride: 117 mEq/L — ABNORMAL HIGH (ref 96–112)
Creatinine, Ser: 2.18 mg/dL — ABNORMAL HIGH (ref 0.50–1.10)
GFR, EST AFRICAN AMERICAN: 26 mL/min — AB (ref >90)
GFR, EST NON AFRICAN AMERICAN: 22 mL/min — AB (ref >90)
Glucose, Bld: 92 mg/dL (ref 70–99)
Potassium: 4.7 mEq/L (ref 3.7–5.3)
Sodium: 145 mEq/L (ref 137–147)

## 2013-01-21 NOTE — Progress Notes (Signed)
Patient transferred from [facility]. Oriented to room. Skin intact. Will monitor.

## 2013-01-21 NOTE — Evaluation (Addendum)
Physical Therapy Evaluation Patient Details Name: Halima Fogal MRN: [id number] DOB: Jun 17, 1945 Today's Date: 01/21/2013 Time: 4098-1191 PT Time Calculation (min): 23 min  PT Assessment / Plan / Recommendation History of Present Illness  Pt is a 68 y/o female admitted after fall at home, during which pt's husband was helping her to the bathroom and she suddenly became weak, collapsing. Pt had chest pain, which has since resolved. PMH includes CVA with residual weakness in right side, HTN, DVT, DM.  Clinical Impression  This patient presents with acute pain and decreased functional independence following the above mentioned event. At the time of PT eval, pt states many times "I'm weak", and during initial stand was only able to hold for a few seconds before returning to sitting. After discussing possibility of rehab at d/c, pt was enthusiastic about ambulation, and was able to walk 30 feet. It is possible that pt is at/near her baseline, however no family present to confirm this. Will keep on acute caseload for strengthening and endurance training for entrance to home and safety ascending steps to bedroom. If pt unable to progress, may have to look at SNF.      PT Assessment  Patient needs continued PT services    Follow Up Recommendations  Home health PT;Supervision for mobility/OOB    Does the patient have the potential to tolerate intense rehabilitation      Barriers to Discharge Inaccessible home environment 13 steps to bedroom/bathroom    Equipment Recommendations  Rolling walker with 5" wheels    Recommendations for Other Services     Frequency Min 3X/week    Precautions / Restrictions Precautions Precautions: Fall Restrictions Weight Bearing Restrictions: No   Pertinent Vitals/Pain Pt reports no pain during session, only states "I'm weak".      Mobility  Bed Mobility Overal bed mobility: Needs Assistance Bed Mobility: Supine to Sit;Sit to Supine Supine to sit: Min  assist Sit to supine: Min assist General bed mobility comments: Assist for movement of LE's to/from EOB. Pt was able to elevate LE's off floor to bed during sit>supine transfer.  Transfers Overall transfer level: Needs assistance Equipment used: Rolling walker (2 wheeled) Transfers: Sit to/from Stand Sit to Stand: Mod assist General transfer comment: Assist to come to full stand, as pt was using posterior lean against bed for support to elevate hips.  Ambulation/Gait Ambulation/Gait assistance: Min guard Ambulation Distance (Feet): 30 Feet Assistive device: Rolling walker (2 wheeled) Gait Pattern/deviations: Step-through pattern;Decreased stride length;Narrow base of support;Antalgic Gait velocity: Decreased Gait velocity interpretation: Below normal speed for age/gender General Gait Details: Foot drop noted on R. Pt very shaky with the RW, but no LOB observed.    Exercises     PT Diagnosis: Difficulty walking  PT Problem List: Decreased strength;Decreased range of motion;Decreased activity tolerance;Decreased balance;Decreased mobility;Decreased knowledge of use of DME;Decreased safety awareness;Pain PT Treatment Interventions: DME instruction;Gait training;Stair training;Functional mobility training;Therapeutic activities;Therapeutic exercise;Neuromuscular re-education;Patient/family education     PT Goals(Current goals can be found in the care plan section) Acute Rehab PT Goals Patient Stated Goal: To return home PT Goal Formulation: With patient Time For Goal Achievement: 02/04/13 Potential to Achieve Goals: Fair  Visit Information  Last PT Received On: 01/21/13 Assistance Needed: +1 History of Present Illness: Pt is a 68 y/o female admitted after fall at home, during which pt's husband was helping her to the bathroom and she suddenly became weak, collapsing. Pt had chest pain, which has since resolved. PMH includes CVA with residual weakness in  right side, HTN, DVT, DM.        Prior Functioning  Home Living Family/patient expects to be discharged to:: Private residence Living Arrangements: Spouse/significant other;Children (Daughter-in-law and son) Available Help at Discharge: Family;Available 24 hours/day Type of Home: House Home Access: Level entry Home Layout: Two level;Bed/bath upstairs Alternate Level Stairs-Number of Steps: 13 steps Alternate Level Stairs-Rails: Right Home Equipment: Bedside commode;Walker - 4 wheels Prior Function Level of Independence: Needs assistance Gait / Transfers Assistance Needed: Dan HumphreysWalker all the time. 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 Cervical / Trunk Assessment Cervical / Trunk Assessment: Kyphotic   Balance Balance Overall balance assessment: History of Falls  End of Session PT - End of Session Equipment Utilized During Treatment: Gait belt Activity Tolerance: Patient limited by fatigue Patient left: in bed;with call bell/phone within reach Nurse Communication: Mobility status  GP     Ruthann CancerHamilton, Saharah Sherrow 01/21/2013, 4:24 PM  Ruthann CancerLaura Hamilton, PT, DPT [phone]

## 2013-01-21 NOTE — Progress Notes (Signed)
[facility] Progress Note [place] TEAM 1 - Stepdown/ICU TEAM   [name] PallIda Meyerhoff WUJ:[id number] DOB: 28-Feb-1945 DOA: 01/19/2013 PCP: August SaucerEAN, [name], MD  Brief narrative: 68 yo female h/o CVA with rt sided hemiparesis partial, HTN, h/o DVT, DM several years ago was going to the bathroo with the help of her husband when she all of a sudden got very weak and almost passed out- no LOC. She started having chest pain which resolved on its own. When asking where her pain was, she points to the left upper chest (near the shoulder). No c/o vomiting or diarrhea but apparently has had a poor appetite lately.    Subjective: No complaints.   Assessment/Plan: Principal Problem:   Chest pain/CAD - resolved- cardiac enzymes negative - VQ also negative   Active Problems: ARF - prerenal- severe dehydration with no urine output for > 18 hrs after admission - Cr slowly improving with aggressive hydration  Anorexia - very poor PO intake for weeks prior to admission- follow carefully   Chronic systolic CHF-  - EF 40-45% on 9/564/14 with focal hypokinesia - follow resp status with hydration- will cut back IVF to 75 cc/hr today  Tachycardia -due to dehydration and steadily improving - no PE noted on VQ    Diabetes mellitus without complication - start sliding scale insulin- hold Metformin  CVA - with dysarthria and right sided weakness - cont ASA 81 mg    H/O dvt of lower extremity   Code Status: Full code Family Communication: detailed discussion with husband at bedside on 1/16 Disposition Plan: transfer to med/surg- advised staff to ambulate yesterday- has not happened- again asked today  Consultants: none  Procedures: none  Antibiotics: none  DVT prophylaxis: Lovenox  Objective: Blood pressure 135/82, pulse 109, temperature 98.8 F (37.1 C), temperature source Oral, resp. rate 18, height 5\' 4"  (1.626 m), weight 58 kg (127 lb 13.9 oz), SpO2 98.00%.  Intake/Output Summary (Last 24  hours) at 01/21/13 1150 Last data filed at 01/21/13 1130  Gross per 24 hour  Intake 3606.25 ml  Output   1600 ml  Net 2006.25 ml     Exam: General: No acute respiratory distress- dysarthria noted Lungs: Clear to auscultation bilaterally without wheezes or crackles Cardiovascular: Regular rate and rhythm- tahcycaric-  without murmur Abdomen: Nontender, nondistended, soft, bowel sounds positive, no rebound, no ascites, no appreciable mass Extremities: No significant cyanosis, clubbing, or edema bilateral lower extremities-  Data Reviewed: Basic Metabolic Panel:  Recent Labs Lab 01/19/13 0300 01/19/13 0315 01/19/13 1400 01/20/13 0345 01/21/13 1005  NA 143 143 143 144 145  K 4.0 4.0 5.1 5.0 4.7  CL 102 105 110 114* 117*  CO2 21  --  18* 17* 19  GLUCOSE 162* 164* 85 112* 92  BUN 31* 37* 30* 24* 16  CREATININE 2.71* 2.80* 2.86* 2.52* 2.18*  CALCIUM 9.5  --  8.3* 7.9* 7.7*   Liver Function Tests:  Recent Labs Lab 01/19/13 0300  AST 16  ALT 7  ALKPHOS 72  BILITOT 0.3  PROT 7.7  ALBUMIN 3.7   No results found for this basename: LIPASE, AMYLASE,  in the last 168 hours No results found for this basename: AMMONIA,  in the last 168 hours CBC:  Recent Labs Lab 01/19/13 0300 01/19/13 0315 01/20/13 0345  WBC 9.8  --  5.8  NEUTROABS 6.1  --   --   HGB 12.8 15.0 9.8*  HCT 39.5 44.0 31.0*  MCV 92.3  --  92.3  PLT 413*  --  313   Cardiac Enzymes:  Recent Labs Lab 01/19/13 0820 01/19/13 1400 01/19/13 1815  TROPONINI <0.30 <0.30 <0.30   BNP (last 3 results) No results found for this basename: PROBNP,  in the last 8760 hours CBG:  Recent Labs Lab 01/20/13 0808 01/20/13 1241 01/20/13 1754 01/20/13 2105 01/21/13 0754  GLUCAP 71 120* 122* 123* 86    Recent Results (from the past 240 hour(s))  MRSA PCR SCREENING     Status: None   Collection Time    01/19/13  7:17 AM      Result Value Range Status   MRSA by PCR NEGATIVE  NEGATIVE Final   Comment:             The GeneXpert MRSA Assay (FDA     approved for NASAL specimens     only), is one component of a     comprehensive MRSA colonization     surveillance program. It is not     intended to diagnose MRSA     infection nor to guide or     monitor treatment for     MRSA infections.     Studies:  Recent x-ray studies have been reviewed in detail by the Attending Physician  Scheduled Meds:  Scheduled Meds: . aspirin EC  81 mg Oral Daily  . atorvastatin  20 mg Oral Daily  . clopidogrel  75 mg Oral Daily  . cyclobenzaprine  10 mg Oral TID  . enoxaparin (LOVENOX) injection  30 mg Subcutaneous Q24H  . ferrous sulfate  325 mg Oral TID WC  . FLUoxetine  10 mg Oral Daily  . insulin aspart  0-15 Units Subcutaneous TID WC  . insulin aspart  0-5 Units Subcutaneous QHS  . pantoprazole  40 mg Oral Q0600   Continuous Infusions: . sodium chloride 125 mL/hr at 01/21/13 1011    Time spent on care of this patient: 25 min   Calvert Cantor, MD  [facility] Office  [phone] Pager - Text Page per Loretha Stapler as per below:  On-Call/Text Page:      [email or web address]      password [email or web address]  If 7PM-7AM, please contact night-coverage [email or web address] Password [email or web address] 01/21/2013, 11:50 AM   LOS: 2 days

## 2013-01-22 ENCOUNTER — Encounter (HOSPITAL_COMMUNITY): Payer: Self-pay | Admitting: Emergency Medicine

## 2013-01-22 ENCOUNTER — Emergency Department (HOSPITAL_COMMUNITY): Payer: Medicare Other

## 2013-01-22 ENCOUNTER — Inpatient Hospital Stay (HOSPITAL_COMMUNITY)
Admission: EM | Admit: 2013-01-22 | Discharge: 2013-01-27 | DRG: 152 | Disposition: A | Discharge status: Home Health | Payer: Medicare Other | Attending: Internal Medicine | Admitting: Internal Medicine

## 2013-01-22 DIAGNOSIS — D649 Anemia, unspecified: Secondary | ICD-10-CM

## 2013-01-22 DIAGNOSIS — N183 Chronic kidney disease, stage 3 unspecified: Secondary | ICD-10-CM | POA: Diagnosis present

## 2013-01-22 DIAGNOSIS — F329 Major depressive disorder, single episode, unspecified: Secondary | ICD-10-CM | POA: Diagnosis present

## 2013-01-22 DIAGNOSIS — N179 Acute kidney failure, unspecified: Secondary | ICD-10-CM

## 2013-01-22 DIAGNOSIS — Z87891 Personal history of nicotine dependence: Secondary | ICD-10-CM

## 2013-01-22 DIAGNOSIS — F32A Depression, unspecified: Secondary | ICD-10-CM

## 2013-01-22 DIAGNOSIS — I252 Old myocardial infarction: Secondary | ICD-10-CM

## 2013-01-22 DIAGNOSIS — J9819 Other pulmonary collapse: Secondary | ICD-10-CM | POA: Diagnosis present

## 2013-01-22 DIAGNOSIS — J111 Influenza due to unidentified influenza virus with other respiratory manifestations: Principal | ICD-10-CM | POA: Diagnosis present

## 2013-01-22 DIAGNOSIS — J209 Acute bronchitis, unspecified: Secondary | ICD-10-CM | POA: Diagnosis present

## 2013-01-22 DIAGNOSIS — D509 Iron deficiency anemia, unspecified: Secondary | ICD-10-CM | POA: Diagnosis present

## 2013-01-22 DIAGNOSIS — R000 Tachycardia, unspecified: Secondary | ICD-10-CM

## 2013-01-22 DIAGNOSIS — E785 Hyperlipidemia, unspecified: Secondary | ICD-10-CM

## 2013-01-22 DIAGNOSIS — R509 Fever, unspecified: Secondary | ICD-10-CM

## 2013-01-22 DIAGNOSIS — Z8673 Personal history of transient ischemic attack (TIA), and cerebral infarction without residual deficits: Secondary | ICD-10-CM

## 2013-01-22 DIAGNOSIS — F3289 Other specified depressive episodes: Secondary | ICD-10-CM | POA: Diagnosis present

## 2013-01-22 DIAGNOSIS — I129 Hypertensive chronic kidney disease with stage 1 through stage 4 chronic kidney disease, or unspecified chronic kidney disease: Secondary | ICD-10-CM | POA: Diagnosis present

## 2013-01-22 DIAGNOSIS — I509 Heart failure, unspecified: Secondary | ICD-10-CM

## 2013-01-22 DIAGNOSIS — Z7982 Long term (current) use of aspirin: Secondary | ICD-10-CM

## 2013-01-22 DIAGNOSIS — J45909 Unspecified asthma, uncomplicated: Secondary | ICD-10-CM | POA: Diagnosis present

## 2013-01-22 DIAGNOSIS — J09X2 Influenza due to identified novel influenza A virus with other respiratory manifestations: Secondary | ICD-10-CM

## 2013-01-22 DIAGNOSIS — M129 Arthropathy, unspecified: Secondary | ICD-10-CM | POA: Diagnosis present

## 2013-01-22 DIAGNOSIS — I5033 Acute on chronic diastolic (congestive) heart failure: Secondary | ICD-10-CM | POA: Diagnosis present

## 2013-01-22 DIAGNOSIS — I639 Cerebral infarction, unspecified: Secondary | ICD-10-CM

## 2013-01-22 DIAGNOSIS — N390 Urinary tract infection, site not specified: Secondary | ICD-10-CM

## 2013-01-22 DIAGNOSIS — Z86718 Personal history of other venous thrombosis and embolism: Secondary | ICD-10-CM

## 2013-01-22 DIAGNOSIS — Z9861 Coronary angioplasty status: Secondary | ICD-10-CM

## 2013-01-22 DIAGNOSIS — I2510 Atherosclerotic heart disease of native coronary artery without angina pectoris: Secondary | ICD-10-CM

## 2013-01-22 DIAGNOSIS — E119 Type 2 diabetes mellitus without complications: Secondary | ICD-10-CM | POA: Diagnosis present

## 2013-01-22 DIAGNOSIS — K219 Gastro-esophageal reflux disease without esophagitis: Secondary | ICD-10-CM | POA: Diagnosis present

## 2013-01-22 LAB — URINALYSIS W MICROSCOPIC + REFLEX CULTURE
BILIRUBIN URINE: NEGATIVE
Glucose, UA: NEGATIVE mg/dL
Hgb urine dipstick: NEGATIVE
KETONES UR: NEGATIVE mg/dL
Leukocytes, UA: NEGATIVE
Nitrite: NEGATIVE
Protein, ur: NEGATIVE mg/dL
SPECIFIC GRAVITY, URINE: 1.011 (ref 1.005–1.030)
UROBILINOGEN UA: 0.2 mg/dL (ref 0.0–1.0)
pH: 5 (ref 5.0–8.0)

## 2013-01-22 LAB — BASIC METABOLIC PANEL
BUN: 15 mg/dL (ref 6–23)
BUN: 15 mg/dL (ref 6–23)
CALCIUM: 7.6 mg/dL — AB (ref 8.4–10.5)
CO2: 18 meq/L — AB (ref 19–32)
CO2: 17 meq/L — AB (ref 19–32)
Calcium: 7.7 mg/dL — ABNORMAL LOW (ref 8.4–10.5)
Chloride: 113 mEq/L — ABNORMAL HIGH (ref 96–112)
Chloride: 110 mEq/L (ref 96–112)
Creatinine, Ser: 1.99 mg/dL — ABNORMAL HIGH (ref 0.50–1.10)
Creatinine, Ser: 2.11 mg/dL — ABNORMAL HIGH (ref 0.50–1.10)
GFR calc Af Amer: 29 mL/min — ABNORMAL LOW (ref >90)
GFR calc Af Amer: 27 mL/min — ABNORMAL LOW (ref >90)
GFR calc non Af Amer: 25 mL/min — ABNORMAL LOW (ref >90)
GFR calc non Af Amer: 23 mL/min — ABNORMAL LOW (ref >90)
GLUCOSE: 83 mg/dL (ref 70–99)
Glucose, Bld: 104 mg/dL — ABNORMAL HIGH (ref 70–99)
POTASSIUM: 4.4 meq/L (ref 3.7–5.3)
Potassium: 4.4 mEq/L (ref 3.7–5.3)
SODIUM: 143 meq/L (ref 137–147)
SODIUM: 143 meq/L (ref 137–147)

## 2013-01-22 LAB — GLUCOSE, CAPILLARY
GLUCOSE-CAPILLARY: 95 mg/dL (ref 70–99)
Glucose-Capillary: 87 mg/dL (ref 70–99)

## 2013-01-22 LAB — POCT I-STAT TROPONIN I: TROPONIN I, POC: 0.02 ng/mL (ref 0.00–0.08)

## 2013-01-22 LAB — CG4 I-STAT (LACTIC ACID): LACTIC ACID, VENOUS: 1.32 mmol/L (ref 0.5–2.2)

## 2013-01-22 LAB — CBC
HCT: 30.9 % — ABNORMAL LOW (ref 36.0–46.0)
Hemoglobin: 10.3 g/dL — ABNORMAL LOW (ref 12.0–15.0)
MCH: 30.6 pg (ref 26.0–34.0)
MCHC: 33.3 g/dL (ref 30.0–36.0)
MCV: 91.7 fL (ref 78.0–100.0)
Platelets: 254 10*3/uL (ref 150–400)
RBC: 3.37 MIL/uL — AB (ref 3.87–5.11)
RDW: 14.6 % (ref 11.5–15.5)
WBC: 5.4 10*3/uL (ref 4.0–10.5)

## 2013-01-22 LAB — PRO B NATRIURETIC PEPTIDE: PRO B NATRI PEPTIDE: 5790 pg/mL — AB (ref 0–125)

## 2013-01-22 MED ORDER — ACETAMINOPHEN 325 MG PO TABS
650.0000 mg | ORAL_TABLET | Freq: Once | ORAL | Status: AC
  Administered 2013-01-22: 650 mg via ORAL
  Filled 2013-01-22: qty 2

## 2013-01-22 MED ORDER — ACETAMINOPHEN 325 MG PO TABS: 650.0000 mg | ORAL_TABLET | ORAL | Status: DC | PRN

## 2013-01-22 MED ORDER — DEXTROMETHORPHAN POLISTIREX 30 MG/5ML PO LQCR: 15.0000 mg | Freq: Two times a day (BID) | ORAL | Status: DC

## 2013-01-22 MED ORDER — ACETAMINOPHEN 325 MG PO TABS
650.0000 mg | ORAL_TABLET | Freq: Four times a day (QID) | ORAL | Status: DC | PRN
  Administered 2013-01-22: 650 mg via ORAL
  Filled 2013-01-22: qty 2

## 2013-01-22 MED ORDER — DEXTROMETHORPHAN POLISTIREX 30 MG/5ML PO LQCR
15.0000 mg | Freq: Two times a day (BID) | ORAL | Status: DC
  Administered 2013-01-22: 11:00:00 via ORAL
  Filled 2013-01-22 (×2): qty 5

## 2013-01-22 MED ORDER — FUROSEMIDE 10 MG/ML IJ SOLN
40.0000 mg | Freq: Every day | INTRAMUSCULAR | Status: DC
  Administered 2013-01-23 – 2013-01-24 (×2): 40 mg via INTRAVENOUS
  Filled 2013-01-22 (×2): qty 4

## 2013-01-22 MED ORDER — SODIUM CHLORIDE 0.9 % IV SOLN
INTRAVENOUS | Status: DC
  Administered 2013-01-22: 22:00:00 via INTRAVENOUS

## 2013-01-22 MED ORDER — SODIUM CHLORIDE 0.9 % IV BOLUS (SEPSIS)
250.0000 mL | Freq: Once | INTRAVENOUS | Status: AC
  Administered 2013-01-22: 250 mL via INTRAVENOUS

## 2013-01-22 NOTE — ED Notes (Signed)
IV insertion attempts x2 unsuccessful, IV team called.

## 2013-01-22 NOTE — ED Provider Notes (Signed)
CSN: [id number]     Arrival date & time 01/22/13  1828 History   First MD Initiated Contact with Patient 01/22/13 1937     Chief Complaint  Patient presents with  . Irregular Heart Beat    HPI Pt was seen at 1945. Per pt and her family, c/o gradual onset and persistence of constant palpitations that began approx 2 to 3 hours PTA. Pt describes her palpitations as "my heart is racing." Has been associated with SOB. Pt states her symptoms began after she got home from being discharged from the hospital for the same symptoms. Family states pt was dx with tachycardia and dehydration and received IVF during her hospitalization which improved her symptoms. Denies CP, no cough, no abd pain, no N/V/D, no syncope/near syncope, no back pain, no fevers, no rash.    Past Medical History  Diagnosis Date  . Coronary artery disease   . Myocardial infarction 04/2012  . DVT (deep venous thrombosis)     "?RLE; was on coumadin f"or 3 yrs"  . Pneumonia     "3 times" (01/19/2013)  . Exertional asthma   . Type II diabetes mellitus   . GERD (gastroesophageal reflux disease)   . Headache(784.0)     "q other week" (01/19/2013)  . Arthritis     "knees" (01/19/2013)  . Stroke 2005    R sided deficits   Past Surgical History  Procedure Laterality Date  . Tubal ligation    . Cataract extraction extracapsular Left   . Dilation and curettage of uterus    . Coronary angioplasty with stent placement  04/2012  . Coronary angioplasty  04/2012    History  Substance Use Topics  . Smoking status: Former Smoker -- 0.50 packs/day for 15 years  . Smokeless tobacco: Never Used  . Alcohol Use: No    Review of Systems ROS: Statement: All systems negative except as marked or noted in the HPI; Constitutional: Negative for fever and chills. ; ; Eyes: Negative for eye pain, redness and discharge. ; ; ENMT: Negative for ear pain, hoarseness, nasal congestion, sinus pressure and sore throat. ; ; Cardiovascular: +palpitations,  SOB. Negative for chest pain, diaphoresis and peripheral edema. ; ; Respiratory: Negative for cough, wheezing and stridor. ; ; Gastrointestinal: Negative for nausea, vomiting, diarrhea, abdominal pain, blood in stool, hematemesis, jaundice and rectal bleeding. . ; ; Genitourinary: Negative for dysuria, flank pain and hematuria. ; ; Musculoskeletal: Negative for back pain and neck pain. Negative for swelling and trauma.; ; Skin: Negative for pruritus, rash, abrasions, blisters, bruising and skin lesion.; ; Neuro: Negative for headache, lightheadedness and neck stiffness. Negative for weakness, altered level of consciousness , altered mental status, extremity weakness, paresthesias, involuntary movement, seizure and syncope.       Allergies  Penicillins  Home Medications   Current Outpatient Rx  Name  Route  Sig  Dispense  Refill  . aspirin EC 81 MG EC tablet   Oral   Take 1 tablet (81 mg total) by mouth daily.   30 tablet   3   . atorvastatin (LIPITOR) 20 MG tablet   Oral   Take 20 mg by mouth daily.         . clopidogrel (PLAVIX) 75 MG tablet   Oral   Take 75 mg by mouth daily.         . cyclobenzaprine (FLEXERIL) 10 MG tablet   Oral   Take 10 mg by mouth 3 (three) times daily.         Marland Kitchen  dextromethorphan (DELSYM) 30 MG/5ML liquid   Oral   Take 2.5 mLs (15 mg total) by mouth 2 (two) times daily.   90 mL   0   . ferrous sulfate 325 (65 FE) MG tablet   Oral   Take 1 tablet (325 mg total) by mouth 3 (three) times daily with meals.   90 tablet   3   . FLUoxetine (PROZAC) 10 MG capsule   Oral   Take 10 mg by mouth daily.         Marland Kitchen lisinopril (PRINIVIL,ZESTRIL) 5 MG tablet   Oral   Take 5 mg by mouth daily.         . magnesium hydroxide (MILK OF MAGNESIA) 400 MG/5ML suspension   Oral   Take 15 mLs by mouth daily as needed for mild constipation.         . nitrofurantoin (MACRODANTIN) 100 MG capsule   Oral   Take 100 mg by mouth every 12 (twelve) hours.          . nitroGLYCERIN (NITROSTAT) 0.4 MG SL tablet   Sublingual   Place 1 tablet (0.4 mg total) under the tongue every 5 (five) minutes x 3 doses as needed for chest pain.   25 tablet   3   . pantoprazole (PROTONIX) 40 MG tablet   Oral   Take 1 tablet (40 mg total) by mouth daily at 6 (six) AM.   30 tablet   3    BP 116/71  Pulse 111  Temp(Src) 99.6 F (37.6 C) (Oral)  Resp 28  SpO2 97% Physical Exam 1950: Physical examination:  Nursing notes reviewed; Vital signs and O2 SAT reviewed;  Constitutional: Well developed, Well nourished, Well hydrated, In no acute distress; Head:  Normocephalic, atraumatic; Eyes: EOMI, PERRL, No scleral icterus; ENMT: Mouth and pharynx normal, Mucous membranes moist; Neck: Supple, Full range of motion, No lymphadenopathy; Cardiovascular: Tachycardic rate and rhythm, No gallop; Respiratory: Breath sounds coarse & equal bilaterally, No wheezes.  Speaking full sentences with ease, Normal respiratory effort/excursion; Chest: Nontender, Movement normal; Abdomen: Soft, Nontender, Nondistended, Normal bowel sounds; Genitourinary: No CVA tenderness; Extremities: Pulses normal, No tenderness, No edema, No calf edema or asymmetry.; Neuro: AA&Ox3, Major CN grossly intact.  Speech clear. No gross focal motor or sensory deficits in extremities.; Skin: Color normal, Warm, Dry.     ED Course  Procedures   EKG Interpretation    Date/Time:  Sunday January 22 2013 18:34:38 EST Ventricular Rate:  148 PR Interval:    QRS Duration: 58 QT Interval:  326 QTC Calculation: 511 R Axis:   79 Text Interpretation:  Supraventricular tachycardia Artifact Baseline wander T wave abnormality, consider inferior ischemia T wave abnormality, consider anterolateral ischemia Abnormal ECG When compared with ECG of 01/19/2013 No significant change was found Confirmed by [facility]  MD, Nicholos Johns [phone]) on 01/22/2013 9:13:33 PM            MDM  MDM Reviewed: previous chart, nursing  note and vitals Reviewed previous: labs and ECG Interpretation: labs, ECG and x-ray   Results for orders placed during the hospital encounter of 01/22/13  CBC      Result Value Range   WBC 5.4  4.0 - 10.5 K/uL   RBC 3.37 (*) 3.87 - 5.11 MIL/uL   Hemoglobin 10.3 (*) 12.0 - 15.0 g/dL   HCT 40.1 (*) 02.7 - 25.3 %   MCV 91.7  78.0 - 100.0 fL   MCH 30.6  26.0 - 34.0 pg  MCHC 33.3  30.0 - 36.0 g/dL   RDW 16.1  09.6 - 04.5 %   Platelets 254  150 - 400 K/uL  BASIC METABOLIC PANEL      Result Value Range   Sodium 143  137 - 147 mEq/L   Potassium 4.4  3.7 - 5.3 mEq/L   Chloride 110  96 - 112 mEq/L   CO2 17 (*) 19 - 32 mEq/L   Glucose, Bld 104 (*) 70 - 99 mg/dL   BUN 15  6 - 23 mg/dL   Creatinine, Ser 4.09 (*) 0.50 - 1.10 mg/dL   Calcium 7.7 (*) 8.4 - 10.5 mg/dL   GFR calc non Af Amer 23 (*) >90 mL/min   GFR calc Af Amer 27 (*) >90 mL/min  PRO B NATRIURETIC PEPTIDE      Result Value Range   Pro B Natriuretic peptide (BNP) 5790.0 (*) 0 - 125 pg/mL  URINALYSIS W MICROSCOPIC + REFLEX CULTURE      Result Value Range   Color, Urine YELLOW  YELLOW   APPearance CLEAR  CLEAR   Specific Gravity, Urine 1.011  1.005 - 1.030   pH 5.0  5.0 - 8.0   Glucose, UA NEGATIVE  NEGATIVE mg/dL   Hgb urine dipstick NEGATIVE  NEGATIVE   Bilirubin Urine NEGATIVE  NEGATIVE   Ketones, ur NEGATIVE  NEGATIVE mg/dL   Protein, ur NEGATIVE  NEGATIVE mg/dL   Urobilinogen, UA 0.2  0.0 - 1.0 mg/dL   Nitrite NEGATIVE  NEGATIVE   Leukocytes, UA NEGATIVE  NEGATIVE   WBC, UA 0-2  <3 WBC/hpf   RBC / HPF 0-2  <3 RBC/hpf   Bacteria, UA RARE  RARE   Squamous Epithelial / LPF RARE  RARE  POCT I-STAT TROPONIN I      Result Value Range   Troponin i, poc 0.02  0.00 - 0.08 ng/mL   Comment 3           CG4 I-STAT (LACTIC ACID)      Result Value Range   Lactic Acid, Venous 1.32  0.5 - 2.2 mmol/L   Nm Pulmonary Perf And Vent 01/19/2013   CLINICAL DATA:  Shortness breath. Chest pain. Difficulty breathing.  EXAM: NUCLEAR  MEDICINE VENTILATION - PERFUSION LUNG SCAN  TECHNIQUE: Ventilation images were obtained in multiple projections using inhaled aerosol technetium 99 M DTPA. Perfusion images were obtained in multiple projections after intravenous injection of Tc-18m MAA.  COMPARISON:  None.  RADIOPHARMACEUTICALS:  40 mCi Tc-68m DTPA aerosol and 6.0 mCi Tc-82m MAA  FINDINGS: Ventilation: No focal ventilation defect.  Perfusion: No wedge shaped peripheral perfusion defects to suggest acute pulmonary embolism.  IMPRESSION: Negative ventilation-perfusion scan.   Electronically Signed   By: Gennette Pac M.D.   On: 01/19/2013 12:34   Dg Chest Portable 1 View 01/22/2013   CLINICAL DATA:  Irregular heartbeat. History of myocardial infarction and asthma.  EXAM: PORTABLE CHEST - 1 VIEW  COMPARISON:  01/19/2013.  FINDINGS: 2027 hr. The heart size and mediastinal contours are stable. There are persistent low lung volumes with mild bibasilar atelectasis. There is chronic central airway thickening. No edema, confluent airspace opacity or significant pleural effusion is seen. Telemetry leads overlie the chest.  IMPRESSION: Stable mild chronic lung disease and basilar atelectasis. No acute process demonstrated.   Electronically Signed   By: Roxy Horseman M.D.   On: 01/22/2013 20:42   Results for JESENIA, SPERA (MRN [id number]) as of 01/23/2013 00:12  Ref. Range 01/21/2013 10:05 01/22/2013  06:01 01/22/2013 18:50  BUN Latest Range: 6-23 mg/dL 16 15 15   Creatinine Latest Range: 0.50-1.10 mg/dL 1.612.18 (H) 0.961.99 (H) 0.452.11 (H)   Results for Anastasia PallCHEEK, Evanna (MRN [id number]) as of 01/23/2013 00:12  Ref. Range 09/07/2010 12:25 09/10/2010 06:22 09/11/2010 05:20 01/22/2013 18:50  Pro B Natriuretic peptide (BNP) Latest Range: 0-125 pg/mL 560.6 (H) 259.0 (H) 208.4 (H) 5790.0 (H)   Results for Anastasia PallCHEEK, Maydell (MRN [id number]) as of 01/23/2013 00:12  Ref. Range 04/06/2012 04:05 04/07/2012 07:14 01/19/2013 03:00 01/19/2013 03:15 01/20/2013 03:45 01/22/2013 18:50  Hemoglobin Latest Range:  12.0-15.0 g/dL 8.3 (L) 8.4 (L) 08.612.8 57.815.0 9.8 (L) 10.3 (L)  HCT Latest Range: 36.0-46.0 % 25.2 (L) 25.3 (L) 39.5 44.0 31.0 (L) 30.9 (L)    2250:  Fever improved with APAP. Tachycardia is resolving with judicious IVF. EPIC chart reviewed: pt's resting HR has been 100-110's throughout her last hospitalization.  New BNP elevation, but no over CHF on CXR. Dx and testing d/w pt and family.  Questions answered.  Verb understanding, agreeable to admit. T/C to [facility] Elisabeth [facility], case discussed, including:  HPI, pertinent PM/SHx, VS/PE, dx testing, ED course and treatment:  Agreeable to admit, requests to write temporary orders, obtain tele bed to team 10.   Laray AngerKathleen M Ardena Gangl, DO 01/23/13 1252

## 2013-01-22 NOTE — H&P (Signed)
[facility] History and Physical  [name] ZOX:[id number] DOB: 29-Nov-1945 DOA: 01/22/2013  Referring physician: ER physician PCP: Willey Blade, MD   Chief Complaint: shortness of breath   HPI:  68 year old female with past medical history of hypertension, depression, CAD who presented to [facility] ED after being discharged from the floor few hours earlier. She was hospitalized for few days for near syncope and chest pain. Now she comes back with sudden onset shortness of breath and palpitations but no chest pain. No reports of cough. She denies fevers but she did have fever in ED. No abdominal pain, nausea or vomiting. No lightheadedness or loss of consciousness. In ED, BP was 116/74, HR 91-150, Tmax 102.5 F and oxygen saturation 96%. CXR did not reveal acute infection. UA was unremarkable. BMP revealed creatinine of 2.11 ( 1/17 it was 2.18). CBC revealed hemoglobin of 10.3. Her BNP was found to be in 5700 and she was given lasix IV 40 mg once. Of note, her last 2 D ECHO was in 04/2012 which showed EF 40% and diastolic dysfunction grade I.  Assessment and Plan:  Principal Problem:   Acute decompensated heart failure - BNP on this admission 5790; 2 D ECHO in 04/2012 showed EF of 40% - obtian 2 D ECHO on this admission - start lasix 40 mg IV daily; monitor renal function - replete electrolytes as needed - daily weight and strict intake and output - may need cardio consult in am  Active Problems:   Fever - unclear etiology - T 99.6 F now - CXR with no evidence of pneumonia and UA unremarkable - defer treatment with abx for now - obtain blood cultures   Anemia, iron deficiency - on ferrous sulfate supplementation   Dyslipidemia - continue statin therapy   Depression - continue prozac   Coronary artery disease - continue aspirin and plavix   Acute renal failure - likely due to lisinopril - hold ACEi for now   Hypertension - BP at goal now; use hydralazine PRN if BP needed to be  controlled further  Radiological Exams on Admission: Dg Chest Portable 1 View 01/22/2013   IMPRESSION: Stable mild chronic lung disease and basilar atelectasis. No acute process demonstrated.     Code Status: Full Family Communication: Pt at bedside Disposition Plan: Admit for further evaluation  Manson Passey, MD  [facility] Pager [phone]  Review of Systems:  Constitutional: Negative for fever, chills and malaise/fatigue. Negative for diaphoresis.  HENT: Negative for hearing loss, ear pain, nosebleeds, congestion, sore throat, neck pain, tinnitus and ear discharge.   Eyes: Negative for blurred vision, double vision, photophobia, pain, discharge and redness.  Respiratory: Negative for cough, hemoptysis, sputum production, positive for shortness of breath, no wheezing and stridor.   Cardiovascular: Negative for chest pain, palpitations, orthopnea, claudication and leg swelling.  Gastrointestinal: Negative for nausea, vomiting and abdominal pain. Negative for heartburn, constipation, blood in stool and melena.  Genitourinary: Negative for dysuria, urgency, frequency, hematuria and flank pain.  Musculoskeletal: Negative for myalgias, back pain, joint pain and falls.  Skin: Negative for itching and rash.  Neurological: Negative for dizziness and weakness. Negative for tingling, tremors, sensory change, speech change, focal weakness, loss of consciousness and headaches.  Endo/Heme/Allergies: Negative for environmental allergies and polydipsia. Does not bruise/bleed easily.  Psychiatric/Behavioral: Negative for suicidal ideas. The patient is not nervous/anxious.      Past Medical History  Diagnosis Date  . Coronary artery disease   . Myocardial infarction   .  DVT (deep venous thrombosis)     "?RLE; was on coumadin f"or 3 yrs"  . Pneumonia     "3 times" (01/19/2013)  . Exertional asthma   . Type II diabetes mellitus   . GERD (gastroesophageal reflux disease)   . Headache(784.0)      "q other week" (01/19/2013)  . Arthritis     "knees" (01/19/2013)  . Stroke 2005    R sided deficits   Past Surgical History  Procedure Laterality Date  . Tubal ligation    . Cataract extraction extracapsular Left   . Dilation and curettage of uterus    . Coronary angioplasty with stent placement     Social History:  reports that she has quit smoking. She has never used smokeless tobacco. She reports that she does not drink alcohol or use illicit drugs.  Allergies  Allergen Reactions  . Penicillins Nausea And Vomiting    Family History: Family medical history significant for HTN, HLD   Prior to Admission medications   Medication Sig Start Date End Date Taking? Authorizing Provider  aspirin EC 81 MG EC tablet Take 1 tablet (81 mg total) by mouth daily. 04/07/12  Yes Robynn PaneMohan N Harwani, MD  atorvastatin (LIPITOR) 20 MG tablet Take 20 mg by mouth daily.   Yes Historical Provider, MD  clopidogrel (PLAVIX) 75 MG tablet Take 75 mg by mouth daily.   Yes Historical Provider, MD  cyclobenzaprine (FLEXERIL) 10 MG tablet Take 10 mg by mouth 3 (three) times daily.   Yes Historical Provider, MD  dextromethorphan (DELSYM) 30 MG/5ML liquid Take 2.5 mLs (15 mg total) by mouth 2 (two) times daily. 01/22/13  Yes Calvert CantorSaima Rizwan, MD  ferrous sulfate 325 (65 FE) MG tablet Take 1 tablet (325 mg total) by mouth 3 (three) times daily with meals. 04/07/12  Yes Robynn PaneMohan N Harwani, MD  FLUoxetine (PROZAC) 10 MG capsule Take 10 mg by mouth daily.   Yes Historical Provider, MD  lisinopril (PRINIVIL,ZESTRIL) 5 MG tablet Take 5 mg by mouth daily.   Yes Historical Provider, MD  magnesium hydroxide (MILK OF MAGNESIA) 400 MG/5ML suspension Take 15 mLs by mouth daily as needed for mild constipation.   Yes Historical Provider, MD  nitrofurantoin (MACRODANTIN) 100 MG capsule Take 100 mg by mouth every 12 (twelve) hours.   Yes Historical Provider, MD  nitroGLYCERIN (NITROSTAT) 0.4 MG SL tablet Place 1 tablet (0.4 mg total) under  the tongue every 5 (five) minutes x 3 doses as needed for chest pain. 04/07/12  Yes Robynn PaneMohan N Harwani, MD  pantoprazole (PROTONIX) 40 MG tablet Take 1 tablet (40 mg total) by mouth daily at 6 (six) AM. 04/07/12  Yes Robynn PaneMohan N Harwani, MD   Physical Exam: Filed Vitals:   01/22/13 2130 01/22/13 2200 01/22/13 2206 01/22/13 2300  BP: 115/64 112/63 112/63 116/71  Pulse: 115 111 111   Temp:   99.6 F (37.6 C)   TempSrc:      Resp: 22  32 28  SpO2: 97% 97% 97%     Physical Exam  Constitutional: Appears ill but no acute distress HENT: Normocephalic. No tonsillar erythema or exudates Eyes: Conjunctivae and EOM are normal. PERRLA, no scleral icterus.  Neck: Normal ROM. Neck supple. No JVD. No tracheal deviation. No thyromegaly.  CVS: tachycardic, S1/S2 appreciated Pulmonary: Effort and breath sounds normal, no stridor, rhonchi, wheezes, rales.  Abdominal: Soft. BS +,  no distension, tenderness, rebound or guarding.  Musculoskeletal: Normal range of motion.  Lymphadenopathy: No lymphadenopathy noted, cervical,  inguinal. Neuro: Alert. No focal neurologic deficits  Skin: Skin is warm and dry.  Psychiatric: Normal mood and affect. Behavior, judgment, thought content normal.   Labs on Admission:  Basic Metabolic Panel:  Recent Labs Lab 01/19/13 1400 01/20/13 0345 01/21/13 1005 01/22/13 0601 01/22/13 1850  NA 143 144 145 143 143  K 5.1 5.0 4.7 4.4 4.4  CL 110 114* 117* 113* 110  CO2 18* 17* 19 18* 17*  GLUCOSE 85 112* 92 83 104*  BUN 30* 24* 16 15 15   CREATININE 2.86* 2.52* 2.18* 1.99* 2.11*  CALCIUM 8.3* 7.9* 7.7* 7.6* 7.7*   Liver Function Tests:  Recent Labs Lab 01/19/13 0300  AST 16  ALT 7  ALKPHOS 72  BILITOT 0.3  PROT 7.7  ALBUMIN 3.7   No results found for this basename: LIPASE, AMYLASE,  in the last 168 hours No results found for this basename: AMMONIA,  in the last 168 hours CBC:  Recent Labs Lab 01/19/13 0300 01/19/13 0315 01/20/13 0345 01/22/13 1850  WBC 9.8   --  5.8 5.4  NEUTROABS 6.1  --   --   --   HGB 12.8 15.0 9.8* 10.3*  HCT 39.5 44.0 31.0* 30.9*  MCV 92.3  --  92.3 91.7  PLT 413*  --  313 254   Cardiac Enzymes:  Recent Labs Lab 01/19/13 0820 01/19/13 1400 01/19/13 1815  TROPONINI <0.30 <0.30 <0.30   BNP: No components found with this basename: POCBNP,  CBG:  Recent Labs Lab 01/21/13 1256 01/21/13 1707 01/21/13 2054 01/22/13 0759 01/22/13 1157  GLUCAP 83 101* 88 95 87    If 7PM-7AM, please contact night-coverage [email or web address] Password [facility] 01/22/2013, 11:25 PM

## 2013-01-22 NOTE — Progress Notes (Signed)
   CARE MANAGEMENT NOTE 01/22/2013  Patient:  Anastasia PallCHEEK,Phoebe   Account Number:  [id number]  Date Initiated:  01/19/2013  Documentation initiated by:  [facility]AMELLIA  Subjective/Objective Assessment:   68 yo female h/o cva with rt sided hemiparesis partial, htn, h/o dvt several years ago was going to the bathroom tonight with the help of her husband when she  got very weak and almost passed out, and started having chest paini     Action/Plan:   heart cath//home with self care   Anticipated DC Date:  01/22/2013   Anticipated DC Plan:  HOME/SELF CARE      DC Planning Services  CM consult      [facility]   Choice offered to / List presented to:  C-3 Spouse        HH arranged  HH-2 PT      HH agency  [facility]   Status of service:  Completed, signed off Medicare Important Message given?   (If response is "NO", the following Medicare IM given date fields will be blank) Date Medicare IM given:   Date Additional Medicare IM given:    Discharge Disposition:  HOME W HOME HEALTH SERVICES  Per UR Regulation:    If discussed at Long Length of Stay Meetings, dates discussed:    Comments:  01/22/13 12:20 Cm spoke to pt in room and then with RN and Husband over phone for choice.  [facility]ayrene FearingJames ([facility]  Address and contact number verified.  [facility]  No other CM needs were communicated.  Freddy JakschSarah Phinneas Shakoor, BSN, CM [phone].

## 2013-01-22 NOTE — ED Notes (Signed)
Pt reports that she was just dc'ed from the floor a couple of hours ago. Reports that she started feeling her heart racing again and was SOB. Denies any chest pain, hx of a-fib.

## 2013-01-22 NOTE — Progress Notes (Signed)
Nsg Discharge Note  Admit Date:  01/19/2013 Discharge date: 01/22/2013   Anastasia PallIda Strehl to be D/C'd Home per MD order.  AVS completed.  Copy for chart, and copy for patient signed, and dated. Patient/caregiver able to verbalize understanding.  Discharge Medication:   Medication List    STOP taking these medications       metFORMIN 500 MG tablet  Commonly known as:  GLUCOPHAGE      TAKE these medications       aspirin 81 MG EC tablet  Take 1 tablet (81 mg total) by mouth daily.     atorvastatin 20 MG tablet  Commonly known as:  LIPITOR  Take 20 mg by mouth daily.     clopidogrel 75 MG tablet  Commonly known as:  PLAVIX  Take 75 mg by mouth daily.     cyclobenzaprine 10 MG tablet  Commonly known as:  FLEXERIL  Take 10 mg by mouth 3 (three) times daily.     dextromethorphan 30 MG/5ML liquid  Commonly known as:  DELSYM  Take 2.5 mLs (15 mg total) by mouth 2 (two) times daily.     ferrous sulfate 325 (65 FE) MG tablet  Take 1 tablet (325 mg total) by mouth 3 (three) times daily with meals.     FLUoxetine 10 MG capsule  Commonly known as:  PROZAC  Take 10 mg by mouth daily.     lisinopril 5 MG tablet  Commonly known as:  PRINIVIL,ZESTRIL  Take 5 mg by mouth daily.     magnesium hydroxide 400 MG/5ML suspension  Commonly known as:  MILK OF MAGNESIA  Take 15 mLs by mouth daily as needed for mild constipation.     nitrofurantoin 100 MG capsule  Commonly known as:  MACRODANTIN  Take 100 mg by mouth every 12 (twelve) hours.     nitroGLYCERIN 0.4 MG SL tablet  Commonly known as:  NITROSTAT  Place 1 tablet (0.4 mg total) under the tongue every 5 (five) minutes x 3 doses as needed for chest pain.     pantoprazole 40 MG tablet  Commonly known as:  PROTONIX  Take 1 tablet (40 mg total) by mouth daily at 6 (six) AM.        Discharge Assessment: Filed Vitals:   01/22/13 0445  BP: 132/80  Pulse: 93  Temp: 98.6 F (37 C)  Resp: 18   Skin clean, dry and intact without  evidence of skin break down, no evidence of skin tears noted. IV catheter discontinued intact. Site without signs and symptoms of complications - no redness or edema noted at insertion site, patient denies c/o pain - only slight tenderness at site.  Dressing with slight pressure applied.  D/c Instructions-Education: Discharge instructions given to patient/family with verbalized understanding. D/c education completed with patient/family including follow up instructions, medication list, d/c activities limitations if indicated, with other d/c instructions as indicated by MD - patient able to verbalize understanding, all questions fully answered. Patient instructed to return to ED, call 911, or call MD for any changes in condition.  Patient escorted via WC, and D/C home via private auto.  Shiro Ellerman, Brooke BonitoKrystal A, RN 01/22/2013 2:21 PM

## 2013-01-22 NOTE — Discharge Summary (Signed)
Physician Discharge Summary  [name] ZOX:[id number] DOB: 15-Feb-1945 DOA: 01/19/2013  PCP: [name], [name], MD  Admit date: 01/19/2013 Discharge date: 01/22/2013  Time spent: >45 minutes  Recommendations for Outpatient Follow-up:  1. HHPT  Discharge Diagnoses:  Principal Problem:   Chest pain Active Problems:    Tachycardia   Acute renal failure   Weakness generalized   H/O dvt of lower extremity   Diabetes mellitus without complication   Stroke   Coronary artery disease  Discharge Condition: stable  Diet recommendation: diabetic and heart healthy  Filed Weights   01/20/13 0400 01/21/13 0435 01/22/13 0500  Weight: 54.1 kg (119 lb 4.3 oz) 58 kg (127 lb 13.9 oz) 58.4 kg (128 lb 12 oz)    History of present illness:  68 yo female h/o CVA with rt sided hemiparesis partial, HTN, h/o DVT, DM several years ago was going to the bathroom with the help of her husband when she all of a sudden got very weak and almost passed out- no LOC. She started having chest pain which resolved on its own. When asking where her pain was, she points to the left upper chest (near the shoulder). No c/o vomiting or diarrhea but apparently has had a very poor appetite over the past 1-2 wks.  Per pt she was recently started on an antibiotic by her PCP. She is not sure why but per poor PO intake prompted her PCP to prescribe this medication. A VQ scan was ordered on admission due to chest pain, hypotension and tachycardia. This was negative    Hospital Course:  Principal Problem:   Chest pain/CAD  - resolved- cardiac enzymes negative - VQ also negative   Active Problems:   ARF  - prerenal and ATN -  severe dehydration with no urine output for > 18 hrs after admission  - Cr near baseline today at 1.99 (baseline appears to be 1.6) - question if whatever antibiotics she tool as outpt  caused ARF  Anorexia  - very poor PO intake for weeks prior to admission-  - significantly improved and now nearly  finishing her meals- adequate fluid intake as well  Chronic systolic CHF-  - EF 40-45% on 8/114/14 with focal hypokinesia  - remains compensated  Sinus Tachycardia - HR in 140s on admission -due to dehydration and now resolved to 80-90 range - no PE noted on VQ   Diabetes mellitus without complication  - start sliding scale insulin- hold Metformin due to renal failure - should on be resumed on this as baseline Cr is > 1.5  CVA  - with dysarthria and right sided weakness  - cont ASA 81 mg  - home health PT   H/O dvt of lower extremity   Procedures:  none  Consultations:  none  Discharge Exam: Filed Vitals:   01/22/13 0445  BP: 132/80  Pulse: 93  Temp: 98.6 F (37 C)  Resp: 18    General: AAo x 3, dysarthric (at baseline) Cardiovascular: RRR, no murmurs Respiratory: CTA b/l   Discharge Instructions  Discharge Orders   Future Orders Complete By Expires   Diet - low sodium heart healthy  As directed    Face-to-face encounter (required for Medicare/Medicaid patients)  As directed    Comments:     I Kaiel Weide certify that this patient is under my care and that I, or a nurse practitioner or physician's assistant working with me, had a face-to-face encounter that meets the physician face-to-face encounter requirements with this patient  on 01/22/2013. The encounter with the patient was in whole, or in part for the following medical condition(s) which is the primary reason for home health care (List medical condition): AKI, dehydration   Questions:     The encounter with the patient was in whole, or in part, for the following medical condition, which is the primary reason for home health care:  AKI, dehydration   I certify that, based on my findings, the following services are medically necessary home health services:  Physical therapy   My clinical findings support the need for the above services:  Unable to leave home safely without assistance and/or assistive device    Further, I certify that my clinical findings support that this patient is homebound due to:  Unable to leave home safely without assistance   Reason for Medically Necessary Home Health Services:  Therapy- Therapeutic Exercises to Increase Strength and Endurance   Home Health  As directed    Questions:     To provide the following care/treatments:  PT   Increase activity slowly  As directed        Medication List    STOP taking these medications       metFORMIN 500 MG tablet  Commonly known as:  GLUCOPHAGE      TAKE these medications       aspirin 81 MG EC tablet  Take 1 tablet (81 mg total) by mouth daily.     atorvastatin 20 MG tablet  Commonly known as:  LIPITOR  Take 20 mg by mouth daily.     clopidogrel 75 MG tablet  Commonly known as:  PLAVIX  Take 75 mg by mouth daily.     cyclobenzaprine 10 MG tablet  Commonly known as:  FLEXERIL  Take 10 mg by mouth 3 (three) times daily.     dextromethorphan 30 MG/5ML liquid  Commonly known as:  DELSYM  Take 2.5 mLs (15 mg total) by mouth 2 (two) times daily.     ferrous sulfate 325 (65 FE) MG tablet  Take 1 tablet (325 mg total) by mouth 3 (three) times daily with meals.     FLUoxetine 10 MG capsule  Commonly known as:  PROZAC  Take 10 mg by mouth daily.     lisinopril 5 MG tablet  Commonly known as:  PRINIVIL,ZESTRIL  Take 5 mg by mouth daily.     magnesium hydroxide 400 MG/5ML suspension  Commonly known as:  MILK OF MAGNESIA  Take 15 mLs by mouth daily as needed for mild constipation.     nitrofurantoin 100 MG capsule  Commonly known as:  MACRODANTIN  Take 100 mg by mouth every 12 (twelve) hours.     nitroGLYCERIN 0.4 MG SL tablet  Commonly known as:  NITROSTAT  Place 1 tablet (0.4 mg total) under the tongue every 5 (five) minutes x 3 doses as needed for chest pain.     pantoprazole 40 MG tablet  Commonly known as:  PROTONIX  Take 1 tablet (40 mg total) by mouth daily at 6 (six) AM.       Allergies   Allergen Reactions  . Penicillins Nausea And Vomiting      The results of significant diagnostics from this hospitalization (including imaging, microbiology, ancillary and laboratory) are listed below for reference.    Significant Diagnostic Studies: Ct Head Wo Contrast  01/19/2013   CLINICAL DATA:  Near syncope, severe headache  EXAM: CT HEAD WITHOUT CONTRAST  TECHNIQUE: Contiguous axial images were obtained  from the base of the skull through the vertex without intravenous contrast.  COMPARISON:  Prior MRI from 09/09/2010 and CT from 09/06/2010  FINDINGS: Diffuse prominence of the CSF containing spaces is compatible with generalized cerebral atrophy, advanced as compared to most recent CT from 09/06/2010. Scattered and confluent hypodensity within the periventricular and deep white matter is consistent with chronic microvascular ischemic disease, also progressed.  Encephalomalacia within the medial left occipital lobe is compatible with remote left PCA territory infarct. There is somewhat more ill-defined hypodensity within this region is well, indeterminate. Bilateral lacunar infarcts noted within the thalami bilaterally. No acute intracranial hemorrhage identified. No definite large vessel territory infarct. No mass lesion or midline shift. Ventricles are within normal limits without evidence hydrocephalus. No extra-axial fluid collection.  Calvarium is intact.  Orbits are normal.  Paranasal sinuses and mastoid air cells are clear.  IMPRESSION: 1. No definite acute intracranial abnormality. 2. Hypodensity and encephalomalacia within the medial left occipital lobe, most compatible with remote left PCA territory infarct. While this finding appears largely chronic in nature, possible superimposed acute ischemia is not excluded. Further evaluation with MRI is recommended if there is concern for acute infarct involving this vascular territory. 3. Remote lacunar infarcts within the bilateral thalami. 4.  Moderate cerebral atrophy with chronic microvascular ischemic changes, progressed as compared to prior CT from 09/17/2010.   Electronically Signed   By: Rise Mu M.D.   On: 01/19/2013 03:30   Nm Pulmonary Perf And Vent  01/19/2013   CLINICAL DATA:  Shortness breath. Chest pain. Difficulty breathing.  EXAM: NUCLEAR MEDICINE VENTILATION - PERFUSION LUNG SCAN  TECHNIQUE: Ventilation images were obtained in multiple projections using inhaled aerosol technetium 99 M DTPA. Perfusion images were obtained in multiple projections after intravenous injection of Tc-75m MAA.  COMPARISON:  None.  RADIOPHARMACEUTICALS:  40 mCi Tc-61m DTPA aerosol and 6.0 mCi Tc-89m MAA  FINDINGS: Ventilation: No focal ventilation defect.  Perfusion: No wedge shaped peripheral perfusion defects to suggest acute pulmonary embolism.  IMPRESSION: Negative ventilation-perfusion scan.   Electronically Signed   By: Gennette Pac M.D.   On: 01/19/2013 12:34   Dg Chest Port 1 View  01/19/2013   CLINICAL DATA:  Shortness of breath  EXAM: PORTABLE CHEST - 1 VIEW  COMPARISON:  04/04/2012  FINDINGS: Stable borderline cardiomegaly. No change in mediastinal contours. No evidence of consolidation or edema. No effusion or pneumothorax. No acute osseous findings.  IMPRESSION: No active disease.   Electronically Signed   By: Tiburcio Pea M.D.   On: 01/19/2013 03:20    Microbiology: Recent Results (from the past 240 hour(s))  MRSA PCR SCREENING     Status: None   Collection Time    01/19/13  7:17 AM      Result Value Range Status   MRSA by PCR NEGATIVE  NEGATIVE Final   Comment:            The GeneXpert MRSA Assay (FDA     approved for NASAL specimens     only), is one component of a     comprehensive MRSA colonization     surveillance program. It is not     intended to diagnose MRSA     infection nor to guide or     monitor treatment for     MRSA infections.     Labs: Basic Metabolic Panel:  Recent Labs Lab  01/19/13 0300 01/19/13 0315 01/19/13 1400 01/20/13 0345 01/21/13 1005 01/22/13 0601  NA 143 143  143 144 145 143  K 4.0 4.0 5.1 5.0 4.7 4.4  CL 102 105 110 114* 117* 113*  CO2 21  --  18* 17* 19 18*  GLUCOSE 162* 164* 85 112* 92 83  BUN 31* 37* 30* 24* 16 15  CREATININE 2.71* 2.80* 2.86* 2.52* 2.18* 1.99*  CALCIUM 9.5  --  8.3* 7.9* 7.7* 7.6*   Liver Function Tests:  Recent Labs Lab 01/19/13 0300  AST 16  ALT 7  ALKPHOS 72  BILITOT 0.3  PROT 7.7  ALBUMIN 3.7   No results found for this basename: LIPASE, AMYLASE,  in the last 168 hours No results found for this basename: AMMONIA,  in the last 168 hours CBC:  Recent Labs Lab 01/19/13 0300 01/19/13 0315 01/20/13 0345  WBC 9.8  --  5.8  NEUTROABS 6.1  --   --   HGB 12.8 15.0 9.8*  HCT 39.5 44.0 31.0*  MCV 92.3  --  92.3  PLT 413*  --  313   Cardiac Enzymes:  Recent Labs Lab 01/19/13 0820 01/19/13 1400 01/19/13 1815  TROPONINI <0.30 <0.30 <0.30   BNP: BNP (last 3 results) No results found for this basename: PROBNP,  in the last 8760 hours CBG:  Recent Labs Lab 01/21/13 0754 01/21/13 1256 01/21/13 1707 01/21/13 2054 01/22/13 0759  GLUCAP 86 83 101* 88 95       SignedCalvert Cantor, MD  [facility] 01/22/2013, 11:05 AM

## 2013-01-23 ENCOUNTER — Inpatient Hospital Stay (HOSPITAL_COMMUNITY): Payer: Medicare Other

## 2013-01-23 ENCOUNTER — Encounter (HOSPITAL_COMMUNITY): Payer: Self-pay | Admitting: Emergency Medicine

## 2013-01-23 DIAGNOSIS — E785 Hyperlipidemia, unspecified: Secondary | ICD-10-CM

## 2013-01-23 DIAGNOSIS — I6350 Cerebral infarction due to unspecified occlusion or stenosis of unspecified cerebral artery: Secondary | ICD-10-CM

## 2013-01-23 DIAGNOSIS — J09X2 Influenza due to identified novel influenza A virus with other respiratory manifestations: Secondary | ICD-10-CM

## 2013-01-23 DIAGNOSIS — I509 Heart failure, unspecified: Secondary | ICD-10-CM

## 2013-01-23 LAB — GLUCOSE, CAPILLARY
GLUCOSE-CAPILLARY: 78 mg/dL (ref 70–99)
Glucose-Capillary: 86 mg/dL (ref 70–99)

## 2013-01-23 LAB — INFLUENZA PANEL BY PCR (TYPE A & B)
H1N1FLUPCR: NOT DETECTED
Influenza A By PCR: POSITIVE — AB
Influenza B By PCR: NEGATIVE

## 2013-01-23 LAB — CBC WITH DIFFERENTIAL/PLATELET
Basophils Absolute: 0 10*3/uL (ref 0.0–0.1)
Basophils Relative: 1 % (ref 0–1)
Eosinophils Absolute: 0.1 10*3/uL (ref 0.0–0.7)
Eosinophils Relative: 2 % (ref 0–5)
HCT: 31.3 % — ABNORMAL LOW (ref 36.0–46.0)
Hemoglobin: 10 g/dL — ABNORMAL LOW (ref 12.0–15.0)
LYMPHS ABS: 0.8 10*3/uL (ref 0.7–4.0)
LYMPHS PCT: 15 % (ref 12–46)
MCH: 29.3 pg (ref 26.0–34.0)
MCHC: 31.9 g/dL (ref 30.0–36.0)
MCV: 91.8 fL (ref 78.0–100.0)
Monocytes Absolute: 0.5 10*3/uL (ref 0.1–1.0)
Monocytes Relative: 9 % (ref 3–12)
NEUTROS PCT: 74 % (ref 43–77)
Neutro Abs: 3.9 10*3/uL (ref 1.7–7.7)
PLATELETS: 252 10*3/uL (ref 150–400)
RBC: 3.41 MIL/uL — AB (ref 3.87–5.11)
RDW: 14.5 % (ref 11.5–15.5)
WBC: 5.2 10*3/uL (ref 4.0–10.5)

## 2013-01-23 LAB — MAGNESIUM: MAGNESIUM: 1 mg/dL — AB (ref 1.5–2.5)

## 2013-01-23 LAB — URINALYSIS, ROUTINE W REFLEX MICROSCOPIC
BILIRUBIN URINE: NEGATIVE
Glucose, UA: NEGATIVE mg/dL
Hgb urine dipstick: NEGATIVE
KETONES UR: NEGATIVE mg/dL
NITRITE: NEGATIVE
PH: 5 (ref 5.0–8.0)
Protein, ur: NEGATIVE mg/dL
Specific Gravity, Urine: 1.008 (ref 1.005–1.030)
Urobilinogen, UA: 0.2 mg/dL (ref 0.0–1.0)

## 2013-01-23 LAB — COMPREHENSIVE METABOLIC PANEL
ALT: 12 U/L (ref 0–35)
AST: 27 U/L (ref 0–37)
Albumin: 2.6 g/dL — ABNORMAL LOW (ref 3.5–5.2)
Alkaline Phosphatase: 59 U/L (ref 39–117)
BUN: 15 mg/dL (ref 6–23)
CALCIUM: 7.5 mg/dL — AB (ref 8.4–10.5)
CO2: 19 mEq/L (ref 19–32)
Chloride: 113 mEq/L — ABNORMAL HIGH (ref 96–112)
Creatinine, Ser: 2.07 mg/dL — ABNORMAL HIGH (ref 0.50–1.10)
GFR calc Af Amer: 27 mL/min — ABNORMAL LOW (ref >90)
GFR calc non Af Amer: 23 mL/min — ABNORMAL LOW (ref >90)
Glucose, Bld: 98 mg/dL (ref 70–99)
Potassium: 4.3 mEq/L (ref 3.7–5.3)
SODIUM: 143 meq/L (ref 137–147)
TOTAL PROTEIN: 5.9 g/dL — AB (ref 6.0–8.3)
Total Bilirubin: 0.2 mg/dL — ABNORMAL LOW (ref 0.3–1.2)

## 2013-01-23 LAB — PRO B NATRIURETIC PEPTIDE: Pro B Natriuretic peptide (BNP): 6000 pg/mL — ABNORMAL HIGH (ref 0–125)

## 2013-01-23 LAB — TSH: TSH: 0.546 u[IU]/mL (ref 0.350–4.500)

## 2013-01-23 LAB — PHOSPHORUS: Phosphorus: 2.2 mg/dL — ABNORMAL LOW (ref 2.3–4.6)

## 2013-01-23 LAB — APTT: APTT: 37 s (ref 24–37)

## 2013-01-23 LAB — URINE MICROSCOPIC-ADD ON

## 2013-01-23 LAB — PROTIME-INR
INR: 1.03 (ref 0.00–1.49)
PROTHROMBIN TIME: 13.3 s (ref 11.6–15.2)

## 2013-01-23 MED ORDER — ACETAMINOPHEN 650 MG RE SUPP: 650.0000 mg | Freq: Four times a day (QID) | RECTAL | Status: DC | PRN

## 2013-01-23 MED ORDER — MAGNESIUM HYDROXIDE 400 MG/5ML PO SUSP: 15.0000 mL | Freq: Every day | ORAL | Status: DC | PRN

## 2013-01-23 MED ORDER — CLOPIDOGREL BISULFATE 75 MG PO TABS
75.0000 mg | ORAL_TABLET | Freq: Every day | ORAL | Status: DC
Start: 2013-01-23 — End: 2013-01-27
  Administered 2013-01-23 – 2013-01-27 (×5): 75 mg via ORAL
  Filled 2013-01-23 (×5): qty 1

## 2013-01-23 MED ORDER — IBUPROFEN 200 MG PO TABS
200.0000 mg | ORAL_TABLET | Freq: Once | ORAL | Status: AC
  Administered 2013-01-23: 200 mg via ORAL
  Filled 2013-01-23: qty 1

## 2013-01-23 MED ORDER — CYCLOBENZAPRINE HCL 10 MG PO TABS
10.0000 mg | ORAL_TABLET | Freq: Three times a day (TID) | ORAL | Status: DC
  Administered 2013-01-23 – 2013-01-27 (×11): 10 mg via ORAL
  Filled 2013-01-23 (×17): qty 1

## 2013-01-23 MED ORDER — FLUOXETINE HCL 10 MG PO CAPS
10.0000 mg | ORAL_CAPSULE | Freq: Every day | ORAL | Status: DC
  Administered 2013-01-23 – 2013-01-27 (×5): 10 mg via ORAL
  Filled 2013-01-23 (×5): qty 1

## 2013-01-23 MED ORDER — HYDROCODONE-ACETAMINOPHEN 5-325 MG PO TABS: 1.0000 | ORAL_TABLET | ORAL | Status: DC | PRN

## 2013-01-23 MED ORDER — PANTOPRAZOLE SODIUM 40 MG PO TBEC
40.0000 mg | DELAYED_RELEASE_TABLET | Freq: Every day | ORAL | Status: DC
  Administered 2013-01-23 – 2013-01-27 (×5): 40 mg via ORAL
  Filled 2013-01-23 (×5): qty 1

## 2013-01-23 MED ORDER — FERROUS SULFATE 325 (65 FE) MG PO TABS
325.0000 mg | ORAL_TABLET | Freq: Three times a day (TID) | ORAL | Status: DC
  Administered 2013-01-23 – 2013-01-27 (×14): 325 mg via ORAL
  Filled 2013-01-23 (×16): qty 1

## 2013-01-23 MED ORDER — CIPROFLOXACIN IN D5W 400 MG/200ML IV SOLN
400.0000 mg | Freq: Two times a day (BID) | INTRAVENOUS | Status: DC
Start: 2013-01-23 — End: 2013-01-23
  Filled 2013-01-23 (×2): qty 200

## 2013-01-23 MED ORDER — MORPHINE SULFATE 2 MG/ML IJ SOLN
1.0000 mg | INTRAMUSCULAR | Status: DC | PRN
Start: 2013-01-23 — End: 2013-01-27

## 2013-01-23 MED ORDER — ATORVASTATIN CALCIUM 20 MG PO TABS
20.0000 mg | ORAL_TABLET | Freq: Every day | ORAL | Status: DC
  Administered 2013-01-23 – 2013-01-27 (×5): 20 mg via ORAL
  Filled 2013-01-23 (×5): qty 1

## 2013-01-23 MED ORDER — CIPROFLOXACIN IN D5W 400 MG/200ML IV SOLN
400.0000 mg | INTRAVENOUS | Status: DC
  Administered 2013-01-23: 400 mg via INTRAVENOUS
  Filled 2013-01-23 (×2): qty 200

## 2013-01-23 MED ORDER — ACETAMINOPHEN 325 MG PO TABS
650.0000 mg | ORAL_TABLET | Freq: Four times a day (QID) | ORAL | Status: DC | PRN
  Administered 2013-01-23 – 2013-01-25 (×4): 650 mg via ORAL
  Filled 2013-01-23 (×4): qty 2

## 2013-01-23 MED ORDER — ASPIRIN EC 81 MG PO TBEC
81.0000 mg | DELAYED_RELEASE_TABLET | Freq: Every day | ORAL | Status: DC
  Administered 2013-01-23 – 2013-01-27 (×5): 81 mg via ORAL
  Filled 2013-01-23 (×5): qty 1

## 2013-01-23 MED ORDER — OSELTAMIVIR PHOSPHATE 30 MG PO CAPS
30.0000 mg | ORAL_CAPSULE | Freq: Every day | ORAL | Status: AC
  Administered 2013-01-23 – 2013-01-27 (×5): 30 mg via ORAL
  Filled 2013-01-23 (×5): qty 1

## 2013-01-23 MED ORDER — NEPRO/CARBSTEADY PO LIQD
237.0000 mL | Freq: Two times a day (BID) | ORAL | Status: DC
  Administered 2013-01-24 – 2013-01-25 (×3): 237 mL via ORAL
  Filled 2013-01-23 (×6): qty 237

## 2013-01-23 MED ORDER — DEXTROMETHORPHAN POLISTIREX 30 MG/5ML PO LQCR
15.0000 mg | Freq: Two times a day (BID) | ORAL | Status: DC
  Administered 2013-01-23 – 2013-01-27 (×10): 15 mg via ORAL
  Filled 2013-01-23 (×13): qty 5

## 2013-01-23 MED ORDER — ONDANSETRON HCL 4 MG/2ML IJ SOLN
4.0000 mg | Freq: Four times a day (QID) | INTRAMUSCULAR | Status: DC | PRN
  Administered 2013-01-24: 4 mg via INTRAVENOUS
  Filled 2013-01-23: qty 2

## 2013-01-23 MED ORDER — SODIUM CHLORIDE 0.9 % IJ SOLN
3.0000 mL | Freq: Two times a day (BID) | INTRAMUSCULAR | Status: DC
  Administered 2013-01-23 – 2013-01-26 (×6): 3 mL via INTRAVENOUS

## 2013-01-23 MED ORDER — ONDANSETRON HCL 4 MG PO TABS: 4.0000 mg | ORAL_TABLET | Freq: Four times a day (QID) | ORAL | Status: DC | PRN

## 2013-01-23 NOTE — Progress Notes (Signed)
  Echocardiogram 2D Echocardiogram has been performed.  [name], [name] 01/23/2013, 11:33 AM

## 2013-01-23 NOTE — Progress Notes (Signed)
[facility] PROGRESS NOTE  [name] ZOX:[id number] DOB: Oct 25, 1945 DOA: 01/22/2013 PCP: [name], MD  Assessment/Plan: 1-SOB: multifactorial; influenza infection and heart failure. -Breathing better. But still febrile. -continue lasix, daily weight and Strict I's and O's -BNP elevated on admission -positive influenza -will treat with tamiflu -troponin neg  2-partially treated UTI: -will treat with cipro  3-diastolic heart failure with mild exacerbation. 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 Positive fever. Positive flu A  Objective: Filed Vitals:   01/23/13 1423  BP: 107/65  Pulse: 133  Temp: 102 F (38.9 C)  Resp:     Intake/Output Summary (Last 24 hours) at 01/23/13 1611 Last data filed at 01/23/13 1452  Gross per 24 hour  Intake      0 ml  Output   1100 ml  Net  -1100 ml   Filed Weights   01/23/13 0041 01/23/13 0606  Weight: 57.2 kg (126 lb 1.7 oz) 57.2 kg (126 lb 1.7 oz)    Exam:   General:  AAOX3, dysarthria positive and febrile  Cardiovascular: S1 and S2, no rubs or gallops; positive murmur  Respiratory: decreased BS at bases, no frank crackles, no wheezing. Positive scattered rhonchi  Abdomen: soft, NT, ND, positive BS  Musculoskeletal: trace edema, no cyanosis, right side decrease range of motion from stroke  (unchanged)  Data Reviewed: Basic Metabolic Panel:  Recent Labs Lab 01/20/13 0345 01/21/13 1005 01/22/13 0601 01/22/13 1850 01/23/13 0230  NA 144 145 143 143 143  K 5.0 4.7 4.4 4.4 4.3  CL 114* 117* 113* 110 113*  CO2 17* 19 18* 17* 19  GLUCOSE 112* 92 83 104* 98  BUN 24* 16 15 15 15   CREATININE 2.52* 2.18* 1.99* 2.11* 2.07*  CALCIUM 7.9* 7.7* 7.6* 7.7* 7.5*  MG  --   --   --   --  1.0*  PHOS  --   --   --   --  2.2*   Liver Function Tests:  Recent Labs Lab 01/19/13 0300 01/23/13 0230  AST 16 27  ALT 7 12  ALKPHOS 72 59  BILITOT 0.3 0.2*  PROT 7.7 5.9*  ALBUMIN 3.7 2.6*   CBC:  Recent Labs Lab 01/19/13 0300 01/19/13 0315 01/20/13 0345 01/22/13 1850 01/23/13 0230  WBC 9.8  --  5.8 5.4 5.2  NEUTROABS 6.1  --   --   --  3.9  HGB 12.8 15.0 9.8* 10.3* 10.0*  HCT 39.5 44.0 31.0* 30.9* 31.3*  MCV 92.3  --  92.3 91.7 91.8  PLT 413*  --  313 254 252   Cardiac Enzymes:  Recent Labs Lab 01/19/13 0820 01/19/13 1400 01/19/13 1815  TROPONINI <0.30 <0.30 <0.30   BNP (last 3 results)  Recent Labs  01/22/13 1850 01/23/13 0230  PROBNP 5790.0* 6000.0*   CBG:  Recent Labs Lab 01/21/13 1707 01/21/13 2054 01/22/13 0759 01/22/13 1157 01/23/13 0557  GLUCAP 101* 88  95 87 86    Recent Results (from the past 240 hour(s))  MRSA PCR SCREENING     Status: None   Collection Time    01/19/13  7:17 AM      Result Value Range Status   MRSA by PCR NEGATIVE  NEGATIVE Final   Comment:            The GeneXpert MRSA Assay (FDA     approved for NASAL specimens     only), is one component of a     comprehensive MRSA colonization     surveillance program. It is not     intended to diagnose MRSA     infection nor to guide or     monitor treatment for     MRSA infections.     Studies: Dg Chest 2 View  01/23/2013   CLINICAL DATA:  Shortness of breath, fever and cough  EXAM: CHEST  2 VIEW  COMPARISON:  01/22/2013  FINDINGS: The heart size and mediastinal  contours are within normal limits. Chronic interstitial coarsening is noted bilaterally. No focal airspace consolidation. Both lungs are clear. The visualized skeletal structures are unremarkable.  IMPRESSION: No active cardiopulmonary disease.   Electronically Signed   By: Signa Kellaylor  Stroud M.D.   On: 01/23/2013 10:28   Dg Chest Portable 1 View  01/22/2013   CLINICAL DATA:  Irregular heartbeat. History of myocardial infarction and asthma.  EXAM: PORTABLE CHEST - 1 VIEW  COMPARISON:  01/19/2013.  FINDINGS: 2027 hr. The heart size and mediastinal contours are stable. There are persistent low lung volumes with mild bibasilar atelectasis. There is chronic central airway thickening. No edema, confluent airspace opacity or significant pleural effusion is seen. Telemetry leads overlie the chest.  IMPRESSION: Stable mild chronic lung disease and basilar atelectasis. No acute process demonstrated.   Electronically Signed   By: Roxy HorsemanBill  Veazey M.D.   On: 01/22/2013 20:42    Scheduled Meds: . aspirin EC  81 mg Oral Daily  . atorvastatin  20 mg Oral Daily  . ciprofloxacin  400 mg Intravenous Q12H  . clopidogrel  75 mg Oral Daily  . cyclobenzaprine  10 mg Oral TID  . dextromethorphan  15 mg Oral BID  . ferrous sulfate  325 mg Oral TID WC  . FLUoxetine  10 mg Oral Daily  . furosemide  40 mg Intravenous Daily  . ibuprofen  200 mg Oral Once  . pantoprazole  40 mg Oral Q0600  . sodium chloride  3 mL Intravenous Q12H   Time spent: >30 minutes  Meshell Abdulaziz  [facility] Pager [phone]. If 7PM-7AM, please contact night-coverage at [email or web address], password [email or web address] 01/23/2013, 4:11 PM  LOS: 1 day

## 2013-01-23 NOTE — Progress Notes (Signed)
Utilization Review Completed Jaree Trinka J. Kentrell Guettler, RN, BSN, NCM [phone]  

## 2013-01-23 NOTE — Progress Notes (Signed)
Pt a/o, slurred speech d/t hx of CVA with right sided weakness, pt febrile 102 orally, PRN tylenol given with no affect, 1 x ibuprofen given temp 99.1, vss, pt at end of shift appears sleepy but arousable with sternal rub,

## 2013-01-24 DIAGNOSIS — N390 Urinary tract infection, site not specified: Secondary | ICD-10-CM

## 2013-01-24 LAB — GLUCOSE, CAPILLARY: GLUCOSE-CAPILLARY: 72 mg/dL (ref 70–99)

## 2013-01-24 MED ORDER — SODIUM CHLORIDE 0.9 % IV BOLUS (SEPSIS): 500.0000 mL | Freq: Once | INTRAVENOUS | Status: DC

## 2013-01-24 MED ORDER — FUROSEMIDE 10 MG/ML IJ SOLN
20.0000 mg | Freq: Every day | INTRAMUSCULAR | Status: DC
  Filled 2013-01-24: qty 2

## 2013-01-24 MED ORDER — LEVOFLOXACIN IN D5W 750 MG/150ML IV SOLN
750.0000 mg | INTRAVENOUS | Status: DC
  Administered 2013-01-24: 750 mg via INTRAVENOUS
  Filled 2013-01-24 (×2): qty 150

## 2013-01-24 MED ORDER — PROCHLORPERAZINE EDISYLATE 5 MG/ML IJ SOLN
10.0000 mg | Freq: Three times a day (TID) | INTRAMUSCULAR | Status: DC | PRN
  Administered 2013-01-24: 10 mg via INTRAVENOUS
  Filled 2013-01-24: qty 2

## 2013-01-24 NOTE — Progress Notes (Signed)
Pt a/o, slurred speech, pt c/o nausea PRN Zofran and compazine given as ordered, pt had temp today of 101.7 PRN tylenol given rechecked and temp was 101.2, Dr. Gwenlyn PerkingMadera notified, abx changed per MD, vss, pt stable

## 2013-01-24 NOTE — Progress Notes (Signed)
Shift event: Earlier, RN paged NP secondary to pt's BP being in the 50s systolically. Pt appeared well and was afebrile with normal RR and HR. O2 sat normal. No change in neuro status over baseline (hx CVA). NP was on nsg unit at that time, so went to see pt.  S: pt says she feels fine other than a HA. Denies chest pain, SOB, dizziness, chills. O: Chronically ill appearing elderly AAF in NAD. Appears acutely well. Non toxic. Appropriate in response. Alert. VS reviewed.  A/P: 1. Hypotension in setting of mild CHF decompensation-pt back on Lasix so maybe a little dry today. Gave 500cc NS bolus and BP up to the 107 range.  Concern for sepsis given her UTI. Rectal temp 98.1. HR stable, BP back up. Lactate was normal yesterday. WBCC normal. Placed on Levaquin for UTI. Will cont to follow, but think likely related to Lasix. Will follow closely. Since pt doesn't appear toxic at all, I think it is prudent to leave her on the nsg unit for now.  CBC and BMP in am.  Jimmye NormanKaren Kirby-Graham, NP [facility]

## 2013-01-24 NOTE — Progress Notes (Signed)
[facility] PROGRESS NOTE  [name] ZOX:[id number] DOB: November 04, 1945 DOA: 01/22/2013 PCP: [name], MD  Assessment/Plan: 1-SOB: multifactorial; influenza infection and heart failure. -Breathing about the same as yesterday. But still spiking fever. -continue lasix (change to 20mg  IV daily), daily weight and Strict I's and O's -BNP elevated (6000) -positive influenza A; will cont tx with tamiflu -troponin neg X 3 -give most likely concomitant bronchitis with influenza, will use levaquin for UTI (which would help if any lung infections as well)  2-partially treated UTI: -will treat with levaquin -follow urine culture  3-diastolic heart failure with mild exacerbation. 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  8-HLD: continue statins    Code Status: Full Family Communication: husband at bedside Disposition Plan: to be determine    Consultants:  None   Procedures:  See below for x-ray reports   Antibiotics:  cipro  tamiflu  HPI/Subjective: Breathing about the same today; no CP. reports some mild suprapubic pain. Still spiking fever. Positive influenza by PCR (A)  Objective: Filed Vitals:   01/24/13 1418  BP: 98/67  Pulse: 138  Temp: 101.2 F (38.4 C)  Resp: 18    Intake/Output Summary (Last 24 hours) at 01/24/13 1449 Last data filed at 01/24/13 1300  Gross per 24 hour  Intake    120 ml  Output   1126 ml  Net  -1006 ml   Filed Weights   01/23/13 0041 01/23/13 0606 01/24/13  0605  Weight: 57.2 kg (126 lb 1.7 oz) 57.2 kg (126 lb 1.7 oz) 56.3 kg (124 lb 1.9 oz)    Exam:   General:  AAOX3, dysarthria positive at baseline from stroke and continue to be febrile  Cardiovascular: S1 and S2, no rubs or gallops; positive murmur  Respiratory: decreased BS at bases, no frank crackles, no wheezing. Positive scattered rhonchi  Abdomen: soft, NT, ND, positive BS  Musculoskeletal: trace edema, no cyanosis, right side decrease range of motion from stroke (unchanged)  Data Reviewed: Basic Metabolic Panel:  Recent Labs Lab 01/20/13 0345 01/21/13 1005 01/22/13 0601 01/22/13 1850 01/23/13 0230  NA 144 145 143 143 143  K 5.0 4.7 4.4 4.4 4.3  CL 114* 117* 113* 110 113*  CO2 17* 19 18* 17* 19  GLUCOSE 112* 92 83 104* 98  BUN 24* 16 15 15 15   CREATININE 2.52* 2.18* 1.99* 2.11* 2.07*  CALCIUM 7.9* 7.7* 7.6* 7.7* 7.5*  MG  --   --   --   --  1.0*  PHOS  --   --   --   --  2.2*   Liver Function Tests:  Recent Labs Lab 01/19/13 0300 01/23/13 0230  AST 16 27  ALT 7 12  ALKPHOS 72 59  BILITOT 0.3 0.2*  PROT 7.7 5.9*  ALBUMIN 3.7 2.6*   CBC:  Recent Labs Lab 01/19/13 0300 01/19/13 0315 01/20/13 0345 01/22/13 1850 01/23/13 0230  WBC 9.8  --  5.8 5.4 5.2  NEUTROABS 6.1  --   --   --  3.9  HGB 12.8 15.0 9.8* 10.3*  10.0*  HCT 39.5 44.0 31.0* 30.9* 31.3*  MCV 92.3  --  92.3 91.7 91.8  PLT 413*  --  313 254 252   Cardiac Enzymes:  Recent Labs Lab 01/19/13 0820 01/19/13 1400 01/19/13 1815  TROPONINI <0.30 <0.30 <0.30   BNP (last 3 results)  Recent Labs  01/22/13 1850 01/23/13 0230  PROBNP 5790.0* 6000.0*   CBG:  Recent Labs Lab 01/22/13 0759 01/22/13 1157 01/23/13 0557 01/23/13 2108 01/24/13 0648  GLUCAP 95 87 86 78 72    Recent Results (from the past 240 hour(s))  MRSA PCR SCREENING     Status: None   Collection Time    01/19/13  7:17 AM      Result Value Range Status   MRSA by PCR NEGATIVE  NEGATIVE Final   Comment:             The GeneXpert MRSA Assay (FDA     approved for NASAL specimens     only), is one component of a     comprehensive MRSA colonization     surveillance program. It is not     intended to diagnose MRSA     infection nor to guide or     monitor treatment for     MRSA infections.  CULTURE, BLOOD (ROUTINE X 2)     Status: None   Collection Time    01/23/13  7:05 AM      Result Value Range Status   Specimen Description BLOOD RIGHT HAND   Final   Special Requests BOTTLES DRAWN AEROBIC AND ANAEROBIC [facility]CC   Final   Culture  Setup Time     Final   Value: 01/23/2013 12:49     [facility]formed [facility]   Culture     Final   Value:        BLOOD CULTURE RECEIVED NO GROWTH TO DATE CULTURE WILL BE HELD FOR 5 DAYS BEFORE ISSUING A FINAL NEGATIVE REPORT     Performed at [facility]   Report Status PENDING   Incomplete  CULTURE, BLOOD (ROUTINE X 2)     Status: None   Collection Time    01/23/13  7:10 AM      Result Value Range Status   Specimen Description BLOOD LEFT ARM   Final   Special Requests BOTTLES DRAWN AEROBIC AND ANAEROBIC 9CC   Final   Culture  Setup Time     Final   Value: 01/23/2013 12:49     [facility]formed at [facility]   Culture     Final   Value:        BLOOD CULTURE RECEIVED NO GROWTH TO DATE CULTURE WILL BE HELD FOR 5 DAYS BEFORE ISSUING A FINAL NEGATIVE REPORT     Performed at [facility]   Report Status PENDING   Incomplete  URINE CULTURE     Status: None   Collection Time    01/23/13  2:46 PM      Result Value Range Status   Specimen Description URINE, RANDOM   Final   Special Requests NONE   Final   Culture  Setup Time     Final   Value: 01/23/2013 16:23     Performed at [facility]   Colony Count     Final   Value: 10,000 COLONIES/ML     Performed at [facility]   Culture     Final   Value: GRAM NEGATIVE RODS  Performed at [facility]   Report Status PENDING   Incomplete     Studies: Dg Chest 2  View  01/23/2013   CLINICAL DATA:  Shortness of breath, fever and cough  EXAM: CHEST  2 VIEW  COMPARISON:  01/22/2013  FINDINGS: The heart size and mediastinal contours are within normal limits. Chronic interstitial coarsening is noted bilaterally. No focal airspace consolidation. Both lungs are clear. The visualized skeletal structures are unremarkable.  IMPRESSION: No active cardiopulmonary disease.   Electronically Signed   By: Signa Kell M.D.   On: 01/23/2013 10:28   Dg Chest Portable 1 View  01/22/2013   CLINICAL DATA:  Irregular heartbeat. History of myocardial infarction and asthma.  EXAM: PORTABLE CHEST - 1 VIEW  COMPARISON:  01/19/2013.  FINDINGS: 2027 hr. The heart size and mediastinal contours are stable. There are persistent low lung volumes with mild bibasilar atelectasis. There is chronic central airway thickening. No edema, confluent airspace opacity or significant pleural effusion is seen. Telemetry leads overlie the chest.  IMPRESSION: Stable mild chronic lung disease and basilar atelectasis. No acute process demonstrated.   Electronically Signed   By: Roxy Horseman M.D.   On: 01/22/2013 20:42    Scheduled Meds: . aspirin EC  81 mg Oral Daily  . atorvastatin  20 mg Oral Daily  . clopidogrel  75 mg Oral Daily  . cyclobenzaprine  10 mg Oral TID  . dextromethorphan  15 mg Oral BID  . feeding supplement (NEPRO CARB STEADY)  237 mL Oral BID BM  . ferrous sulfate  325 mg Oral TID WC  . FLUoxetine  10 mg Oral Daily  . [START ON 01/25/2013] furosemide  20 mg Intravenous Daily  . levofloxacin (LEVAQUIN) IV  750 mg Intravenous Q48H  . oseltamivir  30 mg Oral Daily  . pantoprazole  40 mg Oral Q0600  . sodium chloride  3 mL Intravenous Q12H   Time spent: >30 minutes  Idaliz Tinkle  [facility] Pager [phone]. If 7PM-7AM, please contact night-coverage at [email or web address], password [facility] 01/24/2013, 2:49 PM  LOS: 2 days

## 2013-01-24 NOTE — Progress Notes (Signed)
Pt with episode of hypotension this PM. BP 58/32 manually. Other VS stable. MD paged, and came to floor to see pt. Orders received to give 500 ml NS bolus, obtain rectal temp, and recheck BP upon completion of bolus. BP 107/79 and rectal temp 98.1 upon reassessment. Will page MD back with results and continue to monitor.

## 2013-01-25 LAB — GLUCOSE, CAPILLARY
GLUCOSE-CAPILLARY: 101 mg/dL — AB (ref 70–99)
Glucose-Capillary: 119 mg/dL — ABNORMAL HIGH (ref 70–99)
Glucose-Capillary: 118 mg/dL — ABNORMAL HIGH (ref 70–99)

## 2013-01-25 LAB — BASIC METABOLIC PANEL
BUN: 27 mg/dL — ABNORMAL HIGH (ref 6–23)
BUN: 26 mg/dL — AB (ref 6–23)
CHLORIDE: 104 meq/L (ref 96–112)
CO2: 18 mEq/L — ABNORMAL LOW (ref 19–32)
CO2: 21 mEq/L (ref 19–32)
Calcium: 7.1 mg/dL — ABNORMAL LOW (ref 8.4–10.5)
Calcium: 7.3 mg/dL — ABNORMAL LOW (ref 8.4–10.5)
Chloride: 107 mEq/L (ref 96–112)
Creatinine, Ser: 2.68 mg/dL — ABNORMAL HIGH (ref 0.50–1.10)
Creatinine, Ser: 2.62 mg/dL — ABNORMAL HIGH (ref 0.50–1.10)
GFR calc Af Amer: 20 mL/min — ABNORMAL LOW (ref >90)
GFR calc non Af Amer: 18 mL/min — ABNORMAL LOW (ref >90)
GFR, EST AFRICAN AMERICAN: 20 mL/min — AB (ref >90)
GFR, EST NON AFRICAN AMERICAN: 17 mL/min — AB (ref >90)
GLUCOSE: 118 mg/dL — AB (ref 70–99)
Glucose, Bld: 83 mg/dL (ref 70–99)
Potassium: 4.5 mEq/L (ref 3.7–5.3)
Potassium: 3.9 mEq/L (ref 3.7–5.3)
SODIUM: 141 meq/L (ref 137–147)
SODIUM: 140 meq/L (ref 137–147)

## 2013-01-25 LAB — URINE CULTURE: Colony Count: 10000

## 2013-01-25 LAB — HEMOGLOBIN A1C
Hgb A1c MFr Bld: 6 % — ABNORMAL HIGH (ref <5.7)
Mean Plasma Glucose: 126 mg/dL — ABNORMAL HIGH (ref <117)

## 2013-01-25 LAB — CBC
HCT: 30.2 % — ABNORMAL LOW (ref 36.0–46.0)
Hemoglobin: 9.9 g/dL — ABNORMAL LOW (ref 12.0–15.0)
MCH: 29.7 pg (ref 26.0–34.0)
MCHC: 32.8 g/dL (ref 30.0–36.0)
MCV: 90.7 fL (ref 78.0–100.0)
Platelets: 226 10*3/uL (ref 150–400)
RBC: 3.33 MIL/uL — AB (ref 3.87–5.11)
RDW: 14.9 % (ref 11.5–15.5)
WBC: 4.8 10*3/uL (ref 4.0–10.5)

## 2013-01-25 MED ORDER — ENSURE PUDDING PO PUDG: 1.0000 | Freq: Three times a day (TID) | ORAL | Status: DC

## 2013-01-25 MED ORDER — INSULIN ASPART 100 UNIT/ML ~~LOC~~ SOLN: 0.0000 [IU] | Freq: Three times a day (TID) | SUBCUTANEOUS | Status: DC

## 2013-01-25 MED ORDER — ENSURE COMPLETE PO LIQD
237.0000 mL | Freq: Two times a day (BID) | ORAL | Status: DC
  Administered 2013-01-27: 237 mL via ORAL

## 2013-01-25 MED ORDER — ENSURE COMPLETE PO LIQD
237.0000 mL | Freq: Two times a day (BID) | ORAL | Status: DC
  Administered 2013-01-25: 237 mL via ORAL

## 2013-01-25 MED ORDER — SODIUM CHLORIDE 0.9 % IV BOLUS (SEPSIS)
250.0000 mL | Freq: Once | INTRAVENOUS | Status: AC
  Administered 2013-01-25: 250 mL via INTRAVENOUS

## 2013-01-25 NOTE — Progress Notes (Signed)
Refused breakfast says she never eats breakfast.

## 2013-01-25 NOTE — Progress Notes (Addendum)
[facility] PROGRESS NOTE Assessment/Plan:  SOB: multifactorial; influenza infection,possible acute bronquitis and heart failure.  - Breathing about the same as yesterday. defervecing. - Low Bp with fluids given overnight., worsening Cr. - Positive influenza A; will cont tx with tamiflu  - Troponin neg X 3  - Cont levaquin. - Home in am.  Fever:  - Multifactorial due to flu, acute bronquitis and partially treated UTI  -will treat with levaquin and tamiflu.  Partially treated UTI:  - will treat with levaquin  - follow urine culture   Acute diastolic heart failure with mild exacerbation:  - Hold lasix  - Daily weight and Strict I's and O's  - breathing improved.  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 Due diuretics. - start IV fluids for today.  Anemia:  - continue ferrous sulfate   Hx of CVA:  - No new deficit  - Continue plavix and statins  - PT to reevaluate degree of assistance       Code Status: Full  Family Communication: husband at bedside  Disposition Plan: to be determine    Consultants:  none  Procedures:  X-ray  Antibiotics:  levaquin  tamiflu  HPI/Subjective: No complains  Objective: Filed Vitals:   01/25/13 0520 01/25/13 0548 01/25/13 0702 01/25/13 0830  BP: 81/56  89/58 101/65  Pulse: 117  111 110  Temp: 99.7 F (37.6 C)  98 F (36.7 C) 98.2 F (36.8 C)  TempSrc: Oral  Oral Oral  Resp: 18   18  Height:      Weight:  54.2 kg (119 lb 7.8 oz)    SpO2: 96%   94%    Intake/Output Summary (Last 24 hours) at 01/25/13 1320 Last data filed at 01/24/13 1736  Gross per 24 hour  Intake    240 ml  Output    200 ml  Net     40 ml   Filed Weights   01/23/13 0606 01/24/13 0605 01/25/13 0548  Weight: 57.2 kg (126 lb 1.7 oz) 56.3 kg (124 lb 1.9 oz) 54.2 kg (119 lb 7.8 oz)    Exam:  General: Alert, awake, oriented x3, in no acute distress.  HEENT: No bruits, no goiter. - JVD. Heart: Regular rate and rhythm,  without murmurs, rubs, gallops.  Lungs: Good air movement, bilateral air movement.  Abdomen: Soft, nontender, nondistended, positive bowel sounds.     Data Reviewed: Basic Metabolic Panel:  Recent Labs Lab 01/21/13 1005 01/22/13 0601 01/22/13 1850 01/23/13 0230 01/25/13 0604  NA 145 143 143 143 141  K 4.7 4.4 4.4 4.3 4.5  CL 117* 113* 110 113* 107  CO2 19 18* 17* 19 18*  GLUCOSE 92 83 104* 98 83  BUN 16 15 15 15  27*  CREATININE 2.18* 1.99* 2.11* 2.07* 2.68*  CALCIUM 7.7* 7.6* 7.7* 7.5* 7.1*  MG  --   --   --  1.0*  --   PHOS  --   --   --  2.2*  --    Liver Function Tests:  Recent Labs Lab 01/19/13 0300 01/23/13 0230  AST 16 27  ALT 7 12  ALKPHOS 72 59  BILITOT 0.3 0.2*  PROT 7.7 5.9*  ALBUMIN 3.7 2.6*   No results found for this basename: LIPASE, AMYLASE,  in the last 168 hours No results found for this basename: AMMONIA,  in the last 168 hours CBC:  Recent Labs Lab 01/19/13 0300 01/19/13 0315 01/20/13 0345 01/22/13 1850 01/23/13 0230  01/25/13 0604  WBC 9.8  --  5.8 5.4 5.2 4.8  NEUTROABS 6.1  --   --   --  3.9  --   HGB 12.8 15.0 9.8* 10.3* 10.0* 9.9*  HCT 39.5 44.0 31.0* 30.9* 31.3* 30.2*  MCV 92.3  --  92.3 91.7 91.8 90.7  PLT 413*  --  313 254 252 226   Cardiac Enzymes:  Recent Labs Lab 01/19/13 0820 01/19/13 1400 01/19/13 1815  TROPONINI <0.30 <0.30 <0.30   BNP (last 3 results)  Recent Labs  01/22/13 1850 01/23/13 0230  PROBNP 5790.0* 6000.0*   CBG:  Recent Labs Lab 01/22/13 1157 01/23/13 0557 01/23/13 2108 01/24/13 0648 01/25/13 0636  GLUCAP 87 86 78 72 119*    Recent Results (from the past 240 hour(s))  MRSA PCR SCREENING     Status: None   Collection Time    01/19/13  7:17 AM      Result Value Range Status   MRSA by PCR NEGATIVE  NEGATIVE Final   Comment:            The GeneXpert MRSA Assay (FDA     approved for NASAL specimens     only), is one component of a     comprehensive MRSA colonization      surveillance program. It is not     intended to diagnose MRSA     infection nor to guide or     monitor treatment for     MRSA infections.  CULTURE, BLOOD (ROUTINE X 2)     Status: None   Collection Time    01/23/13  7:05 AM      Result Value Range Status   Specimen Description BLOOD RIGHT HAND   Final   Special Requests BOTTLES DRAWN AEROBIC AND ANAEROBIC [facility]   Final   Culture  Setup Time     Final   Value: 01/23/2013 12:49     Performed at [facility]   Culture     Final   Value:        BLOOD CULTURE RECEIVED NO GROWTH TO DATE CULTURE WILL BE HELD FOR 5 DAYS BEFORE ISSUING A FINAL NEGATIVE REPORT     Performed at [facility]   Report Status PENDING   Incomplete  CULTURE, BLOOD (ROUTINE X 2)     Status: None   Collection Time    01/23/13  7:10 AM      Result Value Range Status   Specimen Description BLOOD LEFT ARM   Final   Special Requests BOTTLES DRAWN AEROBIC AND ANAEROBIC 9CC   Final   Culture  Setup Time     Final   Value: 01/23/2013 12:49     Performed at [facility]   Culture     Final   Value:        BLOOD CULTURE RECEIVED NO GROWTH TO DATE CULTURE WILL BE HELD FOR 5 DAYS BEFORE ISSUING A FINAL NEGATIVE REPORT     Performed at [facility]   Report Status PENDING   Incomplete  URINE CULTURE     Status: None   Collection Time    01/23/13  2:46 PM      Result Value Range Status   Specimen Description URINE, RANDOM   Final   Special Requests NONE   Final   Culture  Setup Time     Final   Value: 01/23/2013 16:23     Performed at [facility]  Colony Count     Final   Value: 10,000 COLONIES/ML     Performed at [facility]   Culture     Final   Value: CITROBACTER FREUNDII     Performed at [facility]   Report Status 01/25/2013 FINAL   Final   Organism ID, Bacteria CITROBACTER FREUNDII   Final     Studies: No results found.  Scheduled Meds: . aspirin EC  81 mg Oral Daily  . atorvastatin  20 mg Oral  Daily  . clopidogrel  75 mg Oral Daily  . cyclobenzaprine  10 mg Oral TID  . dextromethorphan  15 mg Oral BID  . feeding supplement (NEPRO CARB STEADY)  237 mL Oral BID BM  . ferrous sulfate  325 mg Oral TID WC  . FLUoxetine  10 mg Oral Daily  . levofloxacin (LEVAQUIN) IV  750 mg Intravenous Q48H  . oseltamivir  30 mg Oral Daily  . pantoprazole  40 mg Oral Q0600  . sodium chloride  500 mL Intravenous Once  . sodium chloride  3 mL Intravenous Q12H   Continuous Infusions:    Marinda Elk  [facility] Pager [phone]. If 8PM-8AM, please contact night-coverage at [email or web address], password [facility] 01/25/2013, 1:20 PM  LOS: 3 days

## 2013-01-25 NOTE — Progress Notes (Signed)
BP dropped again to 81/56, HR increased to 117, and temp increased to 99.7. MD paged. Order received for 250 mL NS bolus. PRN Tylenol given. BP 89/58 upon reassessment after bolus completed, temp 98.0, and HR 111. Will pass on to day shift RN and continue to monitor.

## 2013-01-25 NOTE — Evaluation (Signed)
Physical Therapy Evaluation Patient Details Name: [name] MRN: [id number] DOB: [birth year] Today's Date: 01/25/2013 Time: 4098-1191 PT Time Calculation (min): 25 min  PT Assessment / Plan / Recommendation History of Present Illness  68 year old female with past medical history of hypertension, depression, CAD who presented to [facility] ED after being discharged from the floor few hours earlier. She was hospitalized for few days for near syncope and chest pain. Now she comes back with sudden onset shortness of breath and palpitations but no chest pain. No reports of cough. She denies fevers but she did have fever in ED. No abdominal pain, nausea or vomiting. No lightheadedness or loss of consciousness.  Clinical Impression  Pt OOB mobility limited by onset of dizziness. BP in 80s/50s t/o treatment. Spoke with patient's spouse who reports he feels that he can con't to take care of patient. Pt functioning near baseline however limited by low BP/dizziness today and poses safety concern for return home due to increased risk of falling.  Once this issue resolved suspect pt to be safe to return home with assist of spouse.     PT Assessment  Patient needs continued PT services    Follow Up Recommendations  Home health PT;Supervision for mobility/OOB;Supervision/Assistance - 24 hour    Does the patient have the potential to tolerate intense rehabilitation      Barriers to Discharge        Equipment Recommendations  None recommended by PT    Recommendations for Other Services     Frequency Min 3X/week    Precautions / Restrictions Precautions Precautions: Fall Restrictions Weight Bearing Restrictions: No   Pertinent Vitals/Pain BP 80/50 in standing, 82/54 once returned to sitting      Mobility  Bed Mobility Overal bed mobility: Needs Assistance Bed Mobility: Rolling;Sidelying to Sit Rolling: Min guard Sidelying to sit: Min assist General bed mobility comments: pt able to push self up  with minA Transfers Overall transfer level: Needs assistance Equipment used: Rolling walker (2 wheeled) Transfers: Sit to/from Stand Sit to Stand: Mod assist General transfer comment: assist to full stand, pt with report "I'm going to fall out" BP 80/50 in standing. Ambulation/Gait Ambulation/Gait assistance:  (unable to due to dizziness)    Exercises     PT Diagnosis: Difficulty walking  PT Problem List: Decreased strength;Decreased mobility PT Treatment Interventions: DME instruction;Gait training;Stair training;Functional mobility training;Therapeutic activities;Therapeutic exercise;Neuromuscular re-education;Patient/family education     PT Goals(Current goals can be found in the care plan section) Acute Rehab PT Goals Patient Stated Goal: To return home PT Goal Formulation: With patient Time For Goal Achievement: 02/08/13 Potential to Achieve Goals: Fair  Visit Information  Last PT Received On: 01/25/13 Assistance Needed: +1 (+2 for ambulation) History of Present Illness: 68 year old female with past medical history of hypertension, depression, CAD who presented to [facility] ED after being discharged from the floor few hours earlier. She was hospitalized for few days for near syncope and chest pain. Now she comes back with sudden onset shortness of breath and palpitations but no chest pain. No reports of cough. She denies fevers but she did have fever in ED. No abdominal pain, nausea or vomiting. No lightheadedness or loss of consciousness.       Prior Functioning  Home Living Family/patient expects to be discharged to:: Private residence Living Arrangements: Spouse/significant other Available Help at Discharge: Family;Available 24 hours/day Type of Home: House Home Access: Level entry Home Layout: Two level;Bed/bath upstairs Alternate Level Stairs-Number of Steps:  13 steps Alternate Level Stairs-Rails: Right Home Equipment: Bedside commode;Walker - 4 wheels Prior  Function Level of Independence: Needs assistance Gait / Transfers Assistance Needed: Dan HumphreysWalker all the time. 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 Cervical / Trunk Assessment Cervical / Trunk Assessment: Kyphotic   Balance Balance Overall balance assessment: Needs assistance Sitting-balance support: Single extremity supported;Feet supported Sitting balance-Leahy Scale: Fair Standing balance support: Bilateral upper extremity supported Standing balance-Leahy Scale: Poor Standing balance comment: poor due to dizziness General Comments General comments (skin integrity, edema, etc.): mobility limited by dizzines/low BP this date  End of Session PT - End of Session Equipment Utilized During Treatment: Gait belt Activity Tolerance: Patient limited by fatigue Patient left: in chair;with call bell/phone within reach Nurse Communication: Mobility status (BPs)  GP     Carnita Golob Marie 01/25/2013, 10:26 AM  Lewis ShockAshly Kachina Niederer, PT, DPT Pager #: [phone] Office #: [phone]

## 2013-01-25 NOTE — Progress Notes (Signed)
1030 Pt given Nepro supplement drink had 3 sips than refused says if i drink anything like that I have to have chocolate. Have Vanilla only on floorstock.

## 2013-01-25 NOTE — Progress Notes (Signed)
Pt tried chocolate ensure pudding shook her head said it's awful. Husband encouraging pt to eat Denies nausea. Now tried Chocolate ensure drink says it's better. Husband continues to encourage pt to drink ensure. Husabnd asked about a brace to help her Rt leg stay forward because he said when he ambulates her at home her Rt foot is turning backwards a  little . Spoke with DR Robb Matarrtiz and he said that they will have to follow up with her PCP. Realayed to pt and her husband. They will follow up as outpatients.

## 2013-01-25 NOTE — Progress Notes (Signed)
I spoke briefly with patient and husband.  He cares for the patient at home and she plans to return home with husband.  I reviewed the heart failure booklet with him as she was dozing on and off while I was in the room.  He acknowledges that she weighs daily.  We reviewed the signs and symptoms of heart failure and a low sodium diet.  I reinforced when to call the physician.  He does say that he does not have a car but is able to have son come and get them as needed for appointments and groceries.  He also says that they are able to get medications and that she takes them as prescribed.  [facility] as it may needed for transition to home.  I will see them again before discharge to reinforce discussion today.    Driscilla Moatsaphne Wilborn Membreno RN, BSN, PCCN--Heart Failure Statisticianurse Navigator

## 2013-01-25 NOTE — Progress Notes (Signed)
MD changed pt to Regular diet so she will eat something. Spoke to dietician. D/C Nepro since pt will not drink it unless it's chocolate switched to ensure chocolate.

## 2013-01-25 NOTE — Progress Notes (Signed)
Encouraged pt to eat lunch picked at food.

## 2013-01-25 NOTE — Progress Notes (Signed)
INITIAL NUTRITION ASSESSMENT  DOCUMENTATION CODES Per approved criteria  -Not Applicable   INTERVENTION: Provide Ensure Complete BID Provide Snacks BID Encourage PO intake  NUTRITION DIAGNOSIS: Inadequate oral intake related to poor appetite as evidenced by pt not eating.   Goal: Pt to meet >/= 90% of their estimated nutrition needs   Monitor:  PO intake Weight Labs  Reason for Assessment: Poor PO intake  68 y.o. female  Admitting Dx: Acute decompensated heart failure  ASSESSMENT: 68 year old female with past medical history of hypertension, depression, CAD who [facility] after being discharged from the floor few hours earlier. She was hospitalized for few days for near syncope and chest pain. Now she comes back with sudden onset shortness of breath and palpitations but no chest pain.  Pt states that she typically eats well at home with 3 meals daily and Ensure supplements 1 to 2 times daily but, in the hospital she tends to not eat well. She reports decreased appetite and RN states that pt refused to eat both breakfast and lunch. Pt interested in receiving snacks and is willing to drink chocolate Ensure. Pt states that she usually weighs 140 lbs. Weight history shows 7% weight loss in the past 3 days.   Height: Ht Readings from Last 1 Encounters:  01/23/13 4\' 10"  (1.473 m)    Weight: Wt Readings from Last 1 Encounters:  01/25/13 119 lb 7.8 oz (54.2 kg)    Ideal Body Weight: 97 lbs  % Ideal Body Weight: 123%  Wt Readings from Last 10 Encounters:  01/25/13 119 lb 7.8 oz (54.2 kg)  01/22/13 128 lb 12 oz (58.4 kg)  04/05/12 147 lb 11.3 oz (67 kg)  04/05/12 147 lb 11.3 oz (67 kg)  03/17/12 140 lb (63.504 kg)  03/17/12 140 lb (63.504 kg)  03/15/12 140 lb (63.504 kg)    Usual Body Weight: 140 lbs  % Usual Body Weight: 85%  BMI:  Body mass index is 24.98 kg/(m^2).  Estimated Nutritional Needs: Kcal: 1300-1500 Protein: 60-70 grams Fluid: 1.5  L/day  Skin: intact  Diet Order: General  EDUCATION NEEDS: -No education needs identified at this time   Intake/Output Summary (Last 24 hours) at 01/25/13 1439 Last data filed at 01/25/13 1400  Gross per 24 hour  Intake    640 ml  Output    202 ml  Net    438 ml    Last BM: 1/21  Labs:   Recent Labs Lab 01/22/13 1850 01/23/13 0230 01/25/13 0604  NA 143 143 141  K 4.4 4.3 4.5  CL 110 113* 107  CO2 17* 19 18*  BUN 15 15 27*  CREATININE 2.11* 2.07* 2.68*  CALCIUM 7.7* 7.5* 7.1*  MG  --  1.0*  --   PHOS  --  2.2*  --   GLUCOSE 104* 98 83    CBG (last 3)   Recent Labs  01/23/13 2108 01/24/13 0648 01/25/13 0636  GLUCAP 78 72 119*    Scheduled Meds: . aspirin EC  81 mg Oral Daily  . atorvastatin  20 mg Oral Daily  . clopidogrel  75 mg Oral Daily  . cyclobenzaprine  10 mg Oral TID  . dextromethorphan  15 mg Oral BID  . feeding supplement (ENSURE COMPLETE)  237 mL Oral BID BM  . ferrous sulfate  325 mg Oral TID WC  . FLUoxetine  10 mg Oral Daily  . insulin aspart  0-9 Units Subcutaneous TID WC  .  levofloxacin (LEVAQUIN) IV  750 mg Intravenous Q48H  . oseltamivir  30 mg Oral Daily  . pantoprazole  40 mg Oral Q0600  . sodium chloride  500 mL Intravenous Once  . sodium chloride  3 mL Intravenous Q12H    Continuous Infusions:   Past Medical History  Diagnosis Date  . Coronary artery disease   . Myocardial infarction 04/2012  . DVT (deep venous thrombosis)     "?RLE; was on coumadin f"or 3 yrs"  . Pneumonia     "3 times" (01/19/2013)  . Exertional asthma   . Type II diabetes mellitus   . GERD (gastroesophageal reflux disease)   . Headache(784.0)     "q other week" (01/19/2013)  . Arthritis     "knees" (01/19/2013)  . Stroke 2005    R sided deficits    Past Surgical History  Procedure Laterality Date  . Tubal ligation    . Cataract extraction extracapsular Left   . Dilation and curettage of uterus    . Coronary angioplasty with stent placement   04/2012  . Coronary angioplasty  04/2012    Ian Malkin RD, LDN Inpatient Clinical Dietitian Pager: [phone] After Hours Pager: [phone]

## 2013-01-26 DIAGNOSIS — N179 Acute kidney failure, unspecified: Secondary | ICD-10-CM | POA: Diagnosis present

## 2013-01-26 LAB — GLUCOSE, CAPILLARY
GLUCOSE-CAPILLARY: 90 mg/dL (ref 70–99)
Glucose-Capillary: 105 mg/dL — ABNORMAL HIGH (ref 70–99)
Glucose-Capillary: 74 mg/dL (ref 70–99)
Glucose-Capillary: 117 mg/dL — ABNORMAL HIGH (ref 70–99)

## 2013-01-26 LAB — BASIC METABOLIC PANEL
BUN: 20 mg/dL (ref 6–23)
CO2: 19 meq/L (ref 19–32)
Calcium: 6.8 mg/dL — ABNORMAL LOW (ref 8.4–10.5)
Chloride: 107 mEq/L (ref 96–112)
Creatinine, Ser: 2.33 mg/dL — ABNORMAL HIGH (ref 0.50–1.10)
GFR calc Af Amer: 24 mL/min — ABNORMAL LOW (ref >90)
GFR, EST NON AFRICAN AMERICAN: 20 mL/min — AB (ref >90)
GLUCOSE: 106 mg/dL — AB (ref 70–99)
Potassium: 3.8 mEq/L (ref 3.7–5.3)
SODIUM: 141 meq/L (ref 137–147)

## 2013-01-26 MED ORDER — SODIUM CHLORIDE 0.9 % IV BOLUS (SEPSIS)
500.0000 mL | Freq: Once | INTRAVENOUS | Status: AC
  Administered 2013-01-26: 500 mL via INTRAVENOUS

## 2013-01-26 MED ORDER — SODIUM CHLORIDE 0.9 % IV BOLUS (SEPSIS)
1000.0000 mL | Freq: Once | INTRAVENOUS | Status: AC
  Administered 2013-01-26: 1000 mL via INTRAVENOUS

## 2013-01-26 MED ORDER — OSELTAMIVIR PHOSPHATE 30 MG PO CAPS: 30.0000 mg | ORAL_CAPSULE | Freq: Every day | ORAL | Status: DC

## 2013-01-26 NOTE — Progress Notes (Signed)
500 cc NS Bolus per MD order still encouraging pt to eat and drink her ensure

## 2013-01-26 NOTE — Progress Notes (Signed)
I spoke again briefly with Mr. and Mrs. Algeo.  I reinforced education including daily weights, signs and symptoms of heart failure, when to call the physician, low sodium diet, and the importance of taking medications as prescribed.  They both acknowledge understanding.  [facility]  Driscilla Moatsaphne Hazelle Woollard RN, BSN, PCCN-- Heart Failure Statisticianurse Navigator

## 2013-01-26 NOTE — Progress Notes (Signed)
Started another NS bolus of 1000 cc see doc flow sheet for VS. Lungs clear. Husband sitting in room with pt watching TV. Pt still has minimal food intake - ate small amt chicken salad husband encouraging pt to eat and drink.

## 2013-01-26 NOTE — Discharge Summary (Signed)
Physician Discharge Summary  [name] IFB:[id number] DOB: May 02, 1945 DOA: 01/22/2013  PCP: Willey Blade, MD  Admit date: 01/22/2013 Discharge date: 01/27/2013  Time spent: 40 minutes  Recommendations for Outpatient Follow-up:  1. Follow up with PCP in 2-3 weeks. CBC in 6 week monitor hgb. 2. Follow up with cardiology with b-met in 1 week.  Discharge Diagnoses:  Principal Problem:   Acute decompensated heart failure Active Problems:   Coronary artery disease   Acute renal failure   Fever   Anemia   Dyslipidemia   Depression   AKI (acute kidney injury)   Discharge Condition: stable  Diet recommendation: heart healthy  Filed Weights   01/25/13 0548 01/26/13 0731 01/27/13 0528  Weight: 54.2 kg (119 lb 7.8 oz) 54.6 kg (120 lb 5.9 oz) 56.382 kg (124 lb 4.8 oz)    History of present illness:  68 year old female with past medical history of hypertension, depression, CAD who presented to [facility] ED after being discharged from the floor few hours earlier. She was hospitalized for few days for near syncope and chest pain. Now she comes back with sudden onset shortness of breath and palpitations but no chest pain. No reports of cough. She denies fevers but she did have fever in ED. No abdominal pain, nausea or vomiting. No lightheadedness or loss of consciousness.   Hospital Course:  SOB and Fever due to influenza infection,possible acute bronquitis: - Started on diuretics for HF, Cr in crease diuretics held. - Positive influenza A; started on Tamiflu. With improvement in temp. - Troponin neg X 3. - Cont levaquin at home. - Multifactorial due to flu, acute bronquitis and partially treated UTI  - Treat with levaquin and Tamiflu,  Finish course at home.  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 Due diuretics.baseline Cr 1.6.  - start IV fluids with improvement in Cr. ( Cr. 2.3),Will need a b-me tin 1 week.  Acute diastolic heart failure with mild exacerbation:  - Held lasix due to  overdiuresis. Pt was not on lasix at home. - Started on low dose metoprolol. Echo done   1.19.2015 that showed : estimated ejection fraction was 55%. Wall motion was Normal; improved from previous echo 2014. - To be re-evaluated by cardiology as an outpatient. Titrate meds as tolerate.  Anemia:  - Continue ferrous sulfate. - cbc in 6 week at PCP office  Hx of CVA:  - No new deficit  - Continue plavix and statins  - PT to reevaluate degree of assistance   Procedures:  CT head  CXR  Consultations:  none  Discharge Exam: Filed Vitals:   01/27/13 0528  BP: 110/61  Pulse: 113  Temp: 98.2 F (36.8 C)  Resp: 18    General: A&O x3 Cardiovascular: RRR Respiratory: good air movement CTA B/L  Discharge Instructions      Discharge Orders   Future Orders Complete By Expires   Diet - low sodium heart healthy  As directed    Diet - low sodium heart healthy  As directed    Increase activity slowly  As directed    Increase activity slowly  As directed        Medication List    STOP taking these medications       nitrofurantoin 100 MG capsule  Commonly known as:  MACRODANTIN      TAKE these medications       aspirin 81 MG EC tablet  Take 1 tablet (81 mg total) by mouth daily.  atorvastatin 20 MG tablet  Commonly known as:  LIPITOR  Take 20 mg by mouth daily.     clopidogrel 75 MG tablet  Commonly known as:  PLAVIX  Take 75 mg by mouth daily.     cyclobenzaprine 10 MG tablet  Commonly known as:  FLEXERIL  Take 10 mg by mouth 3 (three) times daily.     dextromethorphan 30 MG/5ML liquid  Commonly known as:  DELSYM  Take 2.5 mLs (15 mg total) by mouth 2 (two) times daily.     ferrous sulfate 325 (65 FE) MG tablet  Take 1 tablet (325 mg total) by mouth 3 (three) times daily with meals.     FLUoxetine 10 MG capsule  Commonly known as:  PROZAC  Take 10 mg by mouth daily.     lisinopril 5 MG tablet  Commonly known as:  PRINIVIL,ZESTRIL  Take 5 mg by  mouth daily.     magnesium hydroxide 400 MG/5ML suspension  Commonly known as:  MILK OF MAGNESIA  Take 15 mLs by mouth daily as needed for mild constipation.     metoprolol tartrate 12.5 mg Tabs tablet  Commonly known as:  LOPRESSOR  Take 1 tablet (25 mg total) by mouth 2 (two) times daily.     nitroGLYCERIN 0.4 MG SL tablet  Commonly known as:  NITROSTAT  Place 1 tablet (0.4 mg total) under the tongue every 5 (five) minutes x 3 doses as needed for chest pain.     oseltamivir 30 MG capsule  Commonly known as:  TAMIFLU  Take 1 capsule (30 mg total) by mouth daily.     pantoprazole 40 MG tablet  Commonly known as:  PROTONIX  Take 1 tablet (40 mg total) by mouth daily at 6 (six) AM.       Allergies  Allergen Reactions  . Penicillins Nausea And Vomiting   Follow-up Information   Follow up with Marlou Sa, [name], MD In 2 weeks. (hospital follow up)    Specialty:  Internal Medicine   Contact information:   [facility] Internal Medicine [place] [address] [phone]       Follow up with [facility] In 1 week. (hospital follow up)    Contact information:   [place] [address] [phone]       The results of significant diagnostics from this hospitalization (including imaging, microbiology, ancillary and laboratory) are listed below for reference.    Significant Diagnostic Studies: Dg Chest 2 View  01/23/2013   CLINICAL DATA:  Shortness of breath, fever and cough  EXAM: CHEST  2 VIEW  COMPARISON:  01/22/2013  FINDINGS: The heart size and mediastinal contours are within normal limits. Chronic interstitial coarsening is noted bilaterally. No focal airspace consolidation. Both lungs are clear. The visualized skeletal structures are unremarkable.  IMPRESSION: No active cardiopulmonary disease.   Electronically Signed   By: Kerby Moors M.D.   On: 01/23/2013 10:28   Ct Head Wo  Contrast  01/19/2013   CLINICAL DATA:  Near syncope, severe headache  EXAM: CT HEAD WITHOUT CONTRAST  TECHNIQUE: Contiguous axial images were obtained from the base of the skull through the vertex without intravenous contrast.  COMPARISON:  Prior MRI from 09/09/2010 and CT from 09/06/2010  FINDINGS: Diffuse prominence of the CSF containing spaces is compatible with generalized cerebral atrophy, advanced as compared to most recent CT from 09/06/2010. Scattered and confluent hypodensity within the periventricular and deep  white matter is consistent with chronic microvascular ischemic disease, also progressed.  Encephalomalacia within the medial left occipital lobe is compatible with remote left PCA territory infarct. There is somewhat more ill-defined hypodensity within this region is well, indeterminate. Bilateral lacunar infarcts noted within the thalami bilaterally. No acute intracranial hemorrhage identified. No definite large vessel territory infarct. No mass lesion or midline shift. Ventricles are within normal limits without evidence hydrocephalus. No extra-axial fluid collection.  Calvarium is intact.  Orbits are normal.  Paranasal sinuses and mastoid air cells are clear.  IMPRESSION: 1. No definite acute intracranial abnormality. 2. Hypodensity and encephalomalacia within the medial left occipital lobe, most compatible with remote left PCA territory infarct. While this finding appears largely chronic in nature, possible superimposed acute ischemia is not excluded. Further evaluation with MRI is recommended if there is concern for acute infarct involving this vascular territory. 3. Remote lacunar infarcts within the bilateral thalami. 4. Moderate cerebral atrophy with chronic microvascular ischemic changes, progressed as compared to prior CT from 09/17/2010.   Electronically Signed   By: Jeannine Boga M.D.   On: 01/19/2013 03:30   Nm Pulmonary Perf And Vent  01/19/2013   CLINICAL DATA:  Shortness  breath. Chest pain. Difficulty breathing.  EXAM: NUCLEAR MEDICINE VENTILATION - PERFUSION LUNG SCAN  TECHNIQUE: Ventilation images were obtained in multiple projections using inhaled aerosol technetium 99 M DTPA. Perfusion images were obtained in multiple projections after intravenous injection of Tc-73m MAA.  COMPARISON:  None.  RADIOPHARMACEUTICALS:  40 mCi Tc-75m DTPA aerosol and 6.0 mCi Tc-12m MAA  FINDINGS: Ventilation: No focal ventilation defect.  Perfusion: No wedge shaped peripheral perfusion defects to suggest acute pulmonary embolism.  IMPRESSION: Negative ventilation-perfusion scan.   Electronically Signed   By: Lawrence Santiago M.D.   On: 01/19/2013 12:34   Dg Chest Portable 1 View  01/22/2013   CLINICAL DATA:  Irregular heartbeat. History of myocardial infarction and asthma.  EXAM: PORTABLE CHEST - 1 VIEW  COMPARISON:  01/19/2013.  FINDINGS: 2027 hr. The heart size and mediastinal contours are stable. There are persistent low lung volumes with mild bibasilar atelectasis. There is chronic central airway thickening. No edema, confluent airspace opacity or significant pleural effusion is seen. Telemetry leads overlie the chest.  IMPRESSION: Stable mild chronic lung disease and basilar atelectasis. No acute process demonstrated.   Electronically Signed   By: Camie Patience M.D.   On: 01/22/2013 20:42   Dg Chest Port 1 View  01/19/2013   CLINICAL DATA:  Shortness of breath  EXAM: PORTABLE CHEST - 1 VIEW  COMPARISON:  04/04/2012  FINDINGS: Stable borderline cardiomegaly. No change in mediastinal contours. No evidence of consolidation or edema. No effusion or pneumothorax. No acute osseous findings.  IMPRESSION: No active disease.   Electronically Signed   By: Jorje Guild M.D.   On: 01/19/2013 03:20    Microbiology: Recent Results (from the past 240 hour(s))  MRSA PCR SCREENING     Status: None   Collection Time    01/19/13  7:17 AM      Result Value Range Status   MRSA by PCR NEGATIVE   NEGATIVE Final   Comment:            The GeneXpert MRSA Assay (FDA     approved for NASAL specimens     only), is one component of a     comprehensive MRSA colonization     surveillance program. It is not     intended to diagnose  MRSA     infection nor to guide or     monitor treatment for     MRSA infections.  CULTURE, BLOOD (ROUTINE X 2)     Status: None   Collection Time    01/23/13  7:05 AM      Result Value Range Status   Specimen Description BLOOD RIGHT HAND   Final   Special Requests BOTTLES DRAWN AEROBIC AND ANAEROBIC 6CC   Final   Culture  Setup Time     Final   Value: 01/23/2013 12:49     Performed at [facility]   Culture     Final   Value:        BLOOD CULTURE RECEIVED NO GROWTH TO DATE CULTURE WILL BE HELD FOR 5 DAYS BEFORE ISSUING A FINAL NEGATIVE REPORT     Performed at [facility]   Report Status PENDING   Incomplete  CULTURE, BLOOD (ROUTINE X 2)     Status: None   Collection Time    01/23/13  7:10 AM      Result Value Range Status   Specimen Description BLOOD LEFT ARM   Final   Special Requests BOTTLES DRAWN AEROBIC AND ANAEROBIC [place]   Final   Culture  Setup Time     Final   Value: 01/23/2013 12:49     Performed at [facility]   Culture     Final   Value:        BLOOD CULTURE RECEIVED NO GROWTH TO DATE CULTURE WILL BE HELD FOR 5 DAYS BEFORE ISSUING A FINAL NEGATIVE REPORT     Performed at [facility]   Report Status PENDING   Incomplete  URINE CULTURE     Status: None   Collection Time    01/23/13  2:46 PM      Result Value Range Status   Specimen Description URINE, RANDOM   Final   Special Requests NONE   Final   Culture  Setup Time     Final   Value: 01/23/2013 16:23     Performed at [facility]     Final   Value: 10,000 COLONIES/ML     Performed at [facility]   Culture     Final   Value: CITROBACTER FREUNDII     Performed at [facility]   Report Status 01/25/2013  FINAL   Final   Organism ID, Bacteria CITROBACTER FREUNDII   Final     Labs: Basic Metabolic Panel:  Recent Labs Lab 01/22/13 1850 01/23/13 0230 01/25/13 0604 01/25/13 1415 01/26/13 1410 01/27/13 0358  NA 143 143 141 140 141 144  K 4.4 4.3 4.5 3.9 3.8 3.9  CL 110 113* 107 104 107 113*  CO2 17* 19 18* $Remo'21 19 20  'HCsPK$ GLUCOSE 104* 98 83 118* 106* 96  BUN 15 15 27* 26* 20 19  CREATININE 2.11* 2.07* 2.68* 2.62* 2.33* 2.40*  CALCIUM 7.7* 7.5* 7.1* 7.3* 6.8* 6.5*  MG  --  1.0*  --   --   --   --   PHOS  --  2.2*  --   --   --   --    Liver Function Tests:  Recent Labs Lab 01/23/13 0230  AST 27  ALT 12  ALKPHOS 59  BILITOT 0.2*  PROT 5.9*  ALBUMIN 2.6*   No results found for this basename: LIPASE, AMYLASE,  in the last 168 hours No results  found for this basename: AMMONIA,  in the last 168 hours CBC:  Recent Labs Lab 01/22/13 1850 01/23/13 0230 01/25/13 0604  WBC 5.4 5.2 4.8  NEUTROABS  --  3.9  --   HGB 10.3* 10.0* 9.9*  HCT 30.9* 31.3* 30.2*  MCV 91.7 91.8 90.7  PLT 254 252 226   Cardiac Enzymes: No results found for this basename: CKTOTAL, CKMB, CKMBINDEX, TROPONINI,  in the last 168 hours BNP: BNP (last 3 results)  Recent Labs  01/22/13 1850 01/23/13 0230  PROBNP 5790.0* 6000.0*   CBG:  Recent Labs Lab 01/26/13 0644 01/26/13 1041 01/26/13 1715 01/26/13 2102 01/27/13 0640  GLUCAP 90 105* 74 117* 83       Signed:  FELIZ ORTIZ, ABRAHAM  [facility] 01/27/2013, 10:24 AM

## 2013-01-26 NOTE — Progress Notes (Addendum)
[facility] PROGRESS NOTE Assessment/Plan:  SOB: multifactorial; influenza infection,possible acute bronquitis and heart failure.  - Breathing about the same as yesterday. defervecing. - Low Bp with fluids given overnight., worsening Cr. - Positive influenza A; will cont tx with tamiflu  - Troponin neg X 3  - Cont levaquin. - Home in am.  Fever:  - Multifactorial due to flu, acute bronquitis and partially treated UTI  -will treat with levaquin and tamiflu.  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 Due diuretics.baseline Cr 1.6. - start IV fluids for today. - b-met in afternoon.  Partially treated UTI:  - will treat with levaquin  - follow urine culture   Acute diastolic heart failure with mild exacerbation:  - Hold lasix. To be re-evaluated by PCP as an outpatient. - Daily weight and Strict I's and O's  - breathing improved.  Anemia:  - continue ferrous sulfate   Hx of CVA:  - No new deficit  - Continue plavix and statins  - PT to reevaluate degree of assistance       Code Status: Full  Family Communication: husband at bedside  Disposition Plan: to be determine    Consultants:  none  Procedures:  X-ray  Antibiotics:  levaquin  tamiflu  HPI/Subjective: No complains  Objective: Filed Vitals:   01/25/13 2035 01/26/13 0102 01/26/13 0731 01/26/13 0923  BP: _0 82/50  Pulse:  110 117 120  Temp:  97.9 F (36.6 C) 98.9 F (37.2 C) 98.7 F (37.1 C)  TempSrc:  Oral Oral Oral  Resp:  _1 Height:      Weight:   54.6 kg (120 lb 5.9 oz)   SpO2:  97% 96% 100%    Intake/Output Summary (Last 24 hours) at 01/26/13 1042 Last data filed at 01/26/13 0900  Gross per 24 hour  Intake    895 ml  Output    502 ml  Net    393 ml   Filed Weights   01/24/13 0605 01/25/13 0548 01/26/13 0731  Weight: 56.3 kg (124 lb 1.9 oz) 54.2 kg (119 lb 7.8 oz) 54.6 kg (120 lb 5.9 oz)    Exam:  General: Alert, awake, oriented x3,  in no acute distress.  HEENT: No bruits, no goiter. - JVD. Heart: Regular rate and rhythm, without murmurs, rubs, gallops.  Lungs: Good air movement, bilateral air movement.  Abdomen: Soft, nontender, nondistended, positive bowel sounds.     Data Reviewed: Basic Metabolic Panel:  Recent Labs Lab 01/22/13 0601 01/22/13 1850 01/23/13 0230 01/25/13 0604 01/25/13 1415  NA 143 143 143 141 140  K 4.4 4.4 4.3 4.5 3.9  CL 113* 110 113* 107 104  CO2 18* 17* 19 18* 21  GLUCOSE 83 104* 98 83 118*  BUN _2 27* 26*  CREATININE 1.99* 2.11* 2.07* 2.68* 2.62*  CALCIUM 7.6* 7.7* 7.5* 7.1* 7.3*  MG  --   --  1.0*  --   --   PHOS  --   --  2.2*  --   --    Liver Function Tests:  Recent Labs Lab 01/23/13 0230  AST 27  ALT 12  ALKPHOS 59  BILITOT 0.2*  PROT 5.9*  ALBUMIN 2.6*   No results found for this basename: LIPASE, AMYLASE,  in the last 168 hours No results found for this basename: AMMONIA,  in the last 168 hours CBC:  Recent Labs Lab 01/20/13 0345 01/22/13 1850 01/23/13 0230  01/25/13 0604  WBC 5.8 5.4 5.2 4.8  NEUTROABS  --   --  3.9  --   HGB 9.8* 10.3* 10.0* 9.9*  HCT 31.0* 30.9* 31.3* 30.2*  MCV 92.3 91.7 91.8 90.7  PLT 313 254 252 226   Cardiac Enzymes:  Recent Labs Lab 01/19/13 1400 01/19/13 1815  TROPONINI <0.30 <0.30   BNP (last 3 results)  Recent Labs  01/22/13 1850 01/23/13 0230  PROBNP 5790.0* 6000.0*   CBG:  Recent Labs Lab 01/24/13 0648 01/25/13 0636 01/25/13 1654 01/25/13 2127 01/26/13 0644  GLUCAP 72 119* 118* 101* 90    Recent Results (from the past 240 hour(s))  MRSA PCR SCREENING     Status: None   Collection Time    01/19/13  7:17 AM      Result Value Range Status   MRSA by PCR NEGATIVE  NEGATIVE Final   Comment:            The GeneXpert MRSA Assay (FDA     approved for NASAL specimens     only), is one component of a     comprehensive MRSA colonization     surveillance program. It is not     intended to  diagnose MRSA     infection nor to guide or     monitor treatment for     MRSA infections.  CULTURE, BLOOD (ROUTINE X 2)     Status: None   Collection Time    01/23/13  7:05 AM      Result Value Range Status   Specimen Description BLOOD RIGHT HAND   Final   Special Requests BOTTLES DRAWN AEROBIC AND ANAEROBIC [facility]   Final   Culture  Setup Time     Final   Value: 01/23/2013 12:49     Performed at [facility]   Culture     Final   Value:        BLOOD CULTURE RECEIVED NO GROWTH TO DATE CULTURE WILL BE HELD FOR 5 DAYS BEFORE ISSUING A FINAL NEGATIVE REPORT     Performed at [facility]   Report Status PENDING   Incomplete  CULTURE, BLOOD (ROUTINE X 2)     Status: None   Collection Time    01/23/13  7:10 AM      Result Value Range Status   Specimen Description BLOOD LEFT ARM   Final   Special Requests BOTTLES DRAWN AEROBIC AND ANAEROBIC [place]   Final   Culture  Setup Time     Final   Value: 01/23/2013 12:49     Performed at [facility]   Culture     Final   Value:        BLOOD CULTURE RECEIVED NO GROWTH TO DATE CULTURE WILL BE HELD FOR 5 DAYS BEFORE ISSUING A FINAL NEGATIVE REPORT     Performed at [facility]   Report Status PENDING   Incomplete  URINE CULTURE     Status: None   Collection Time    01/23/13  2:46 PM      Result Value Range Status   Specimen Description URINE, RANDOM   Final   Special Requests NONE   Final   Culture  Setup Time     Final   Value: 01/23/2013 16:23     Performed at [place]     Final   Value: 10,000 COLONIES/ML     Performed at [facility]  Culture     Final   Value: CITROBACTER FREUNDII     Performed at [facility]   Report Status 01/25/2013 FINAL   Final   Organism ID, Bacteria CITROBACTER FREUNDII   Final     Studies: No results found.  Scheduled Meds: . aspirin EC  81 mg Oral Daily  . atorvastatin  20 mg Oral Daily  . clopidogrel  75 mg Oral Daily  .  cyclobenzaprine  10 mg Oral TID  . dextromethorphan  15 mg Oral BID  . feeding supplement (ENSURE COMPLETE)  237 mL Oral BID BM  . ferrous sulfate  325 mg Oral TID WC  . FLUoxetine  10 mg Oral Daily  . insulin aspart  0-9 Units Subcutaneous TID WC  . oseltamivir  30 mg Oral Daily  . pantoprazole  40 mg Oral Q0600  . sodium chloride  500 mL Intravenous Once  . sodium chloride  3 mL Intravenous Q12H   Continuous Infusions:    Charlynne Cousins  [facility] Pager [phone]. If 8PM-8AM, please contact night-coverage at [email or web address], password [facility] 01/26/2013, 10:42 AM  LOS: 4 days

## 2013-01-27 LAB — GLUCOSE, CAPILLARY
Glucose-Capillary: 83 mg/dL (ref 70–99)
Glucose-Capillary: 88 mg/dL (ref 70–99)

## 2013-01-27 LAB — BASIC METABOLIC PANEL
BUN: 19 mg/dL (ref 6–23)
CHLORIDE: 113 meq/L — AB (ref 96–112)
CO2: 20 mEq/L (ref 19–32)
Calcium: 6.5 mg/dL — ABNORMAL LOW (ref 8.4–10.5)
Creatinine, Ser: 2.4 mg/dL — ABNORMAL HIGH (ref 0.50–1.10)
GFR calc non Af Amer: 20 mL/min — ABNORMAL LOW (ref >90)
GFR, EST AFRICAN AMERICAN: 23 mL/min — AB (ref >90)
Glucose, Bld: 96 mg/dL (ref 70–99)
POTASSIUM: 3.9 meq/L (ref 3.7–5.3)
Sodium: 144 mEq/L (ref 137–147)

## 2013-01-27 MED ORDER — METOPROLOL TARTRATE 12.5 MG HALF TABLET: 12.5000 mg | ORAL_TABLET | Freq: Two times a day (BID) | ORAL | Status: DC

## 2013-01-27 MED ORDER — METOPROLOL TARTRATE 12.5 MG HALF TABLET
12.5000 mg | ORAL_TABLET | Freq: Two times a day (BID) | ORAL | Status: DC
  Administered 2013-01-27: 12.5 mg via ORAL
  Filled 2013-01-27 (×2): qty 1

## 2013-01-27 MED ORDER — METOPROLOL TARTRATE 12.5 MG HALF TABLET: 25.0000 mg | ORAL_TABLET | Freq: Two times a day (BID) | ORAL | Status: DC

## 2013-01-27 MED ORDER — FUROSEMIDE 10 MG/ML IJ SOLN
20.0000 mg | Freq: Once | INTRAMUSCULAR | Status: AC
  Administered 2013-01-27: 20 mg via INTRAVENOUS

## 2013-01-27 MED ORDER — FUROSEMIDE 10 MG/ML IJ SOLN
40.0000 mg | Freq: Once | INTRAMUSCULAR | Status: DC
  Filled 2013-01-27: qty 4

## 2013-01-27 NOTE — Progress Notes (Signed)
IV d/c'd.  Tele d/c'd.  Pt d/c'd to home.  Home meds and d/c instructions discussed with pt and pt spouse.  Both deny any questions or concerns.  Pt leaving unit via wheelchair and appears in no acute distress. Nino Glowourtney Sacora Hawbaker RN

## 2013-01-27 NOTE — Progress Notes (Signed)
Physical Therapy Treatment Patient Details Name: Cathe Bilger MRN: [id number] DOB: 29-Sep-1945 Today's Date: 01/27/2013 Time: 1203-1216 PT Time Calculation (min): 13 min  PT Assessment / Plan / Recommendation  History of Present Illness 68 year old female with past medical history of hypertension, depression, CAD who presented to [facility] ED after being discharged from the floor few hours earlier. She was hospitalized for few days for near syncope and chest pain. Now she comes back with sudden onset shortness of breath and palpitations but no chest pain. No reports of cough. She denies fevers but she did have fever in ED. No abdominal pain, nausea or vomiting. No lightheadedness or loss of consciousness.   PT Comments   Pt able to ambulate with PT today.  Discussed home safety with husband and son.  They state that once pt is upstairs she stays upstairs.  Sons will be present to A pt up the stairs.  Husband demonstrated how he A pt with SPT.  Continue to recommend HHPT.  Follow Up Recommendations  Home health PT;Supervision for mobility/OOB;Supervision/Assistance - 24 hour     Does the patient have the potential to tolerate intense rehabilitation     Barriers to Discharge        Equipment Recommendations  None recommended by PT    Recommendations for Other Services    Frequency Min 3X/week   Progress towards PT Goals Progress towards PT goals: Progressing toward goals  Plan Current plan remains appropriate    Precautions / Restrictions Precautions Precautions: Fall   Pertinent Vitals/Pain No c/o pain.    Mobility  Bed Mobility Overal bed mobility: Needs Assistance Supine to sit: Min assist General bed mobility comments:  (A for legs, and pt used rail to get trunk upright) Transfers Overall transfer level: Needs assistance Transfers: Stand Pivot Transfers Sit to Stand: Min assist Stand pivot transfers: Min assist (husband demonstrated transfer for PT) General transfer comment: no c/o  dizziness today. Ambulation/Gait Ambulation/Gait assistance: Mod assist;Min assist Ambulation Distance (Feet): 20 Feet Assistive device: Rolling walker (2 wheeled) Gait Pattern/deviations: Decreased step length - right;Decreased dorsiflexion - right (R ankle IR with dragging of foot.) Gait velocity: Decreased    Exercises     PT Diagnosis:    PT Problem List:   PT Treatment Interventions:     PT Goals (current goals can now be found in the care plan section)    Visit Information  Last PT Received On: 01/27/13 Assistance Needed: +1 History of Present Illness: 68 year old female with past medical history of hypertension, depression, CAD who presented to [facility] ED after being discharged from the floor few hours earlier. She was hospitalized for few days for near syncope and chest pain. Now she comes back with sudden onset shortness of breath and palpitations but no chest pain. No reports of cough. She denies fevers but she did have fever in ED. No abdominal pain, nausea or vomiting. No lightheadedness or loss of consciousness.    Subjective Data  Subjective: husband and son present   Cognition  Cognition Arousal/Alertness: Awake/alert Behavior During Therapy: WFL for tasks assessed/performed Overall Cognitive Status: Within Functional Limits for tasks assessed    Balance  Balance Overall balance assessment: Needs assistance Standing balance-Leahy Scale: Poor Standing balance comment: needs RW and physical A to maintain balance  End of Session PT - End of Session Equipment Utilized During Treatment: Gait belt Activity Tolerance: Patient tolerated treatment well Patient left: in chair;with call bell/phone within reach;with nursing/sitter in room;with family/visitor  present Nurse Communication: Mobility status   GP     Kc Summerson LUBECK 01/27/2013, 12:30 PM

## 2013-01-28 ENCOUNTER — Encounter: Payer: Self-pay | Admitting: Cardiology

## 2013-01-29 LAB — CULTURE, BLOOD (ROUTINE X 2)
CULTURE: NO GROWTH
Culture: NO GROWTH

## 2013-02-02 ENCOUNTER — Ambulatory Visit (INDEPENDENT_AMBULATORY_CARE_PROVIDER_SITE_OTHER): Payer: Medicare Other | Admitting: Cardiology

## 2013-02-02 ENCOUNTER — Encounter: Payer: Self-pay | Admitting: Cardiology

## 2013-02-02 VITALS — BP 100/60 | HR 76 | Ht <= 58 in | Wt 120.8 lb

## 2013-02-02 DIAGNOSIS — I2510 Atherosclerotic heart disease of native coronary artery without angina pectoris: Secondary | ICD-10-CM

## 2013-02-02 DIAGNOSIS — I509 Heart failure, unspecified: Secondary | ICD-10-CM

## 2013-02-02 DIAGNOSIS — E785 Hyperlipidemia, unspecified: Secondary | ICD-10-CM

## 2013-02-02 DIAGNOSIS — I5032 Chronic diastolic (congestive) heart failure: Secondary | ICD-10-CM

## 2013-02-02 NOTE — Progress Notes (Signed)
[address] [place], [place]  [address] Phone: [phone] Fax:  [phone]  Date:  02/02/2013   ID:  Gilbert Narain, DOB August 14, 1945, MRN [id number]  PCP:  Willey Blade, MD  Cardiologist:  Armanda Magic, MD     History of Present Illness: 68 year old female with past medical history of hypertension, depression, CAD who presented to [facility] ED after being discharged from the floor few hours earlier. She was hospitalized for few days for near syncope and chest pain and then readmitted a few hours later with SOB and palpitations.  She was febrile and diagnosed with influenza.  She ruled out for MI.  She was in acute diastolic CHF and diuresed but then her diuretics had to be held due to acute on chronic CKD.  An echo was done which showed an EF 55%.  SHe was not seen by Cardiology in the hospital and is now here for further workup and treatment of chronic diastolic CHF.  Since discharge her SOB has resolved.  She denies any chest pain, LE, palpitations, dizziness or syncope.   Apparently she some racing of her heart last weekend because she had not gotten her metoprolol filled.  After starting metoprolol she has not had any problems.     Wt Readings from Last 3 Encounters:  02/02/13 120 lb 12.8 oz (54.795 kg)  01/27/13 124 lb 4.8 oz (56.382 kg)  01/22/13 128 lb 12 oz (58.4 kg)     Past Medical History  Diagnosis Date  . Myocardial infarction 04/2012  . DVT (deep venous thrombosis)     "?RLE; was on coumadin f"or 3 yrs"  . Pneumonia     "3 times" (01/19/2013)  . Exertional asthma   . Type II diabetes mellitus   . GERD (gastroesophageal reflux disease)   . Headache(784.0)     "q other week" (01/19/2013)  . Arthritis     "knees" (01/19/2013)  . Stroke 2005    R sided deficits  . Chronic diastolic CHF (congestive heart failure)   . Coronary artery disease     Current Outpatient Prescriptions  Medication Sig Dispense Refill  . aspirin EC 81 MG EC tablet Take 1 tablet (81 mg total)  by mouth daily.  30 tablet  3  . atorvastatin (LIPITOR) 20 MG tablet Take 40 mg by mouth daily.       . clopidogrel (PLAVIX) 75 MG tablet Take 75 mg by mouth daily.      . cyclobenzaprine (FLEXERIL) 10 MG tablet Take 10 mg by mouth 3 (three) times daily.      Marland Kitchen dextromethorphan (DELSYM) 30 MG/5ML liquid Take 2.5 mLs (15 mg total) by mouth 2 (two) times daily.  90 mL  0  . ferrous sulfate 325 (65 FE) MG tablet Take 1 tablet (325 mg total) by mouth 3 (three) times daily with meals.  90 tablet  3  . FLUoxetine (PROZAC) 10 MG capsule Take 10 mg by mouth daily.      Marland Kitchen lisinopril (PRINIVIL,ZESTRIL) 5 MG tablet Take 5 mg by mouth 2 (two) times daily.       . magnesium hydroxide (MILK OF MAGNESIA) 400 MG/5ML suspension Take 15 mLs by mouth daily as needed for mild constipation.      . metoprolol tartrate (LOPRESSOR) 12.5 mg TABS tablet Take 1 tablet (25 mg total) by mouth 2 (two) times daily.  30 tablet  0  . nitroGLYCERIN (NITROSTAT) 0.4 MG SL tablet Place 1 tablet (0.4 mg  total) under the tongue every 5 (five) minutes x 3 doses as needed for chest pain.  25 tablet  3  . oseltamivir (TAMIFLU) 30 MG capsule Take 1 capsule (30 mg total) by mouth daily.  4 capsule  0  . pantoprazole (PROTONIX) 40 MG tablet Take 1 tablet (40 mg total) by mouth daily at 6 (six) AM.  30 tablet  3   No current facility-administered medications for this visit.    Allergies:    Allergies  Allergen Reactions  . Penicillins Nausea And Vomiting    Social History:  The patient  reports that she has quit smoking. She has never used smokeless tobacco. She reports that she does not drink alcohol or use illicit drugs.   Family History:  The patient's family history includes Heart attack in her mother; Heart failure in her mother; Stomach cancer in her father.   ROS:  Please see the history of present illness.      All other systems reviewed and negative.   PHYSICAL EXAM: VS:  BP 100/60  Pulse 76  Ht 4\' 10"  (1.473 m)  Wt 120  lb 12.8 oz (54.795 kg)  BMI 25.25 kg/m2 Well nourished, well developed, in no acute distress HEENT: normal Neck: no JVD Cardiac:  normal S1, S2; RRR; no murmur Lungs:  clear to auscultation bilaterally, no wheezing, rhonchi or rales Abd: soft, nontender, no hepatomegaly Ext: no edema Skin: warm and dry Neuro:  CNs 2-12 intact, no focal abnormalities noted    ASSESSMENT AND PLAN:  1. Chronic diastolic CHF with recent exacerbation in the setting of influenza  - appears well compensated  - continue Lisinopril/beta blocker 2. HTN - well controlleld  - continue metoprolol/Lisinopril 3. ASCAD with cath a year ago by Dr. Sharyn LullHarwani and is followed by Dr. Sharyn LullHarwani  - her husband does not know why she was sent to see me since she sees Dr. Sharyn LullHarwani.  I will set up an appt with Dr. Sharyn LullHarwani for her to follow back with  Signed, Armanda Magicraci Meesha Sek, MD 02/02/2013 10:56 AM

## 2013-02-02 NOTE — Patient Instructions (Signed)
Your physician recommends that you continue on your current medications as directed. Please refer to the Current Medication list given to you today.  You are set to see Dr Sharyn LullHarwani tomorrow 1/30 at 1:45. If you need to reschedule you can call their office to do so.  Address: [address] [place], [place] [address]  [phone]

## 2013-03-21 ENCOUNTER — Other Ambulatory Visit: Payer: Self-pay | Admitting: Internal Medicine

## 2013-04-15 ENCOUNTER — Emergency Department (HOSPITAL_COMMUNITY): Payer: Medicare Other

## 2013-04-15 ENCOUNTER — Encounter (HOSPITAL_COMMUNITY): Payer: Self-pay | Admitting: Emergency Medicine

## 2013-04-15 ENCOUNTER — Inpatient Hospital Stay (HOSPITAL_COMMUNITY)
Admission: EM | Admit: 2013-04-15 | Discharge: 2013-04-20 | DRG: 682 | Disposition: A | Discharge status: Home Health | Payer: Medicare Other | Attending: Family Medicine | Admitting: Family Medicine

## 2013-04-15 DIAGNOSIS — R5383 Other fatigue: Secondary | ICD-10-CM

## 2013-04-15 DIAGNOSIS — F3289 Other specified depressive episodes: Secondary | ICD-10-CM | POA: Diagnosis present

## 2013-04-15 DIAGNOSIS — I69959 Hemiplegia and hemiparesis following unspecified cerebrovascular disease affecting unspecified side: Secondary | ICD-10-CM

## 2013-04-15 DIAGNOSIS — I129 Hypertensive chronic kidney disease with stage 1 through stage 4 chronic kidney disease, or unspecified chronic kidney disease: Secondary | ICD-10-CM | POA: Diagnosis present

## 2013-04-15 DIAGNOSIS — I5032 Chronic diastolic (congestive) heart failure: Secondary | ICD-10-CM | POA: Diagnosis present

## 2013-04-15 DIAGNOSIS — F329 Major depressive disorder, single episode, unspecified: Secondary | ICD-10-CM | POA: Diagnosis present

## 2013-04-15 DIAGNOSIS — E875 Hyperkalemia: Secondary | ICD-10-CM | POA: Diagnosis present

## 2013-04-15 DIAGNOSIS — E1129 Type 2 diabetes mellitus with other diabetic kidney complication: Secondary | ICD-10-CM | POA: Diagnosis present

## 2013-04-15 DIAGNOSIS — E43 Unspecified severe protein-calorie malnutrition: Secondary | ICD-10-CM | POA: Insufficient documentation

## 2013-04-15 DIAGNOSIS — I509 Heart failure, unspecified: Secondary | ICD-10-CM | POA: Diagnosis present

## 2013-04-15 DIAGNOSIS — N189 Chronic kidney disease, unspecified: Secondary | ICD-10-CM

## 2013-04-15 DIAGNOSIS — N390 Urinary tract infection, site not specified: Secondary | ICD-10-CM | POA: Diagnosis present

## 2013-04-15 DIAGNOSIS — N039 Chronic nephritic syndrome with unspecified morphologic changes: Secondary | ICD-10-CM

## 2013-04-15 DIAGNOSIS — N179 Acute kidney failure, unspecified: Principal | ICD-10-CM | POA: Diagnosis present

## 2013-04-15 DIAGNOSIS — F32A Depression, unspecified: Secondary | ICD-10-CM

## 2013-04-15 DIAGNOSIS — IMO0002 Reserved for concepts with insufficient information to code with codable children: Secondary | ICD-10-CM

## 2013-04-15 DIAGNOSIS — R55 Syncope and collapse: Secondary | ICD-10-CM | POA: Diagnosis present

## 2013-04-15 DIAGNOSIS — I2510 Atherosclerotic heart disease of native coronary artery without angina pectoris: Secondary | ICD-10-CM | POA: Diagnosis present

## 2013-04-15 DIAGNOSIS — D631 Anemia in chronic kidney disease: Secondary | ICD-10-CM | POA: Diagnosis present

## 2013-04-15 DIAGNOSIS — R1110 Vomiting, unspecified: Secondary | ICD-10-CM | POA: Diagnosis present

## 2013-04-15 DIAGNOSIS — R5381 Other malaise: Secondary | ICD-10-CM | POA: Diagnosis present

## 2013-04-15 DIAGNOSIS — I252 Old myocardial infarction: Secondary | ICD-10-CM

## 2013-04-15 DIAGNOSIS — N184 Chronic kidney disease, stage 4 (severe): Secondary | ICD-10-CM | POA: Diagnosis present

## 2013-04-15 DIAGNOSIS — D649 Anemia, unspecified: Secondary | ICD-10-CM

## 2013-04-15 DIAGNOSIS — K219 Gastro-esophageal reflux disease without esophagitis: Secondary | ICD-10-CM | POA: Diagnosis present

## 2013-04-15 DIAGNOSIS — R29818 Other symptoms and signs involving the nervous system: Secondary | ICD-10-CM | POA: Diagnosis present

## 2013-04-15 DIAGNOSIS — E119 Type 2 diabetes mellitus without complications: Secondary | ICD-10-CM | POA: Diagnosis present

## 2013-04-15 DIAGNOSIS — E7800 Pure hypercholesterolemia, unspecified: Secondary | ICD-10-CM | POA: Diagnosis present

## 2013-04-15 DIAGNOSIS — Z9861 Coronary angioplasty status: Secondary | ICD-10-CM

## 2013-04-15 DIAGNOSIS — Z7982 Long term (current) use of aspirin: Secondary | ICD-10-CM

## 2013-04-15 DIAGNOSIS — Z87891 Personal history of nicotine dependence: Secondary | ICD-10-CM

## 2013-04-15 LAB — CBC WITH DIFFERENTIAL/PLATELET
Basophils Absolute: 0 10*3/uL (ref 0.0–0.1)
Basophils Relative: 0 % (ref 0–1)
EOS ABS: 0.1 10*3/uL (ref 0.0–0.7)
EOS PCT: 1 % (ref 0–5)
HCT: 37.6 % (ref 36.0–46.0)
Hemoglobin: 12.4 g/dL (ref 12.0–15.0)
LYMPHS ABS: 1.8 10*3/uL (ref 0.7–4.0)
Lymphocytes Relative: 17 % (ref 12–46)
MCH: 29.7 pg (ref 26.0–34.0)
MCHC: 33 g/dL (ref 30.0–36.0)
MCV: 90 fL (ref 78.0–100.0)
Monocytes Absolute: 0.8 10*3/uL (ref 0.1–1.0)
Monocytes Relative: 8 % (ref 3–12)
Neutro Abs: 7.9 10*3/uL — ABNORMAL HIGH (ref 1.7–7.7)
Neutrophils Relative %: 74 % (ref 43–77)
Platelets: 417 10*3/uL — ABNORMAL HIGH (ref 150–400)
RBC: 4.18 MIL/uL (ref 3.87–5.11)
RDW: 13.6 % (ref 11.5–15.5)
WBC: 10.6 10*3/uL — ABNORMAL HIGH (ref 4.0–10.5)

## 2013-04-15 LAB — COMPREHENSIVE METABOLIC PANEL
ALT: 7 U/L (ref 0–35)
AST: 22 U/L (ref 0–37)
Albumin: 3.9 g/dL (ref 3.5–5.2)
Alkaline Phosphatase: 101 U/L (ref 39–117)
BUN: 37 mg/dL — ABNORMAL HIGH (ref 6–23)
CALCIUM: 10.6 mg/dL — AB (ref 8.4–10.5)
CO2: 25 mEq/L (ref 19–32)
Chloride: 94 mEq/L — ABNORMAL LOW (ref 96–112)
Creatinine, Ser: 3.1 mg/dL — ABNORMAL HIGH (ref 0.50–1.10)
GFR calc non Af Amer: 14 mL/min — ABNORMAL LOW (ref >90)
GFR, EST AFRICAN AMERICAN: 17 mL/min — AB (ref >90)
GLUCOSE: 143 mg/dL — AB (ref 70–99)
Potassium: 5.6 mEq/L — ABNORMAL HIGH (ref 3.7–5.3)
Sodium: 135 mEq/L — ABNORMAL LOW (ref 137–147)
TOTAL PROTEIN: 9.2 g/dL — AB (ref 6.0–8.3)
Total Bilirubin: 0.4 mg/dL (ref 0.3–1.2)

## 2013-04-15 LAB — URINE MICROSCOPIC-ADD ON

## 2013-04-15 LAB — URINALYSIS, ROUTINE W REFLEX MICROSCOPIC
Glucose, UA: NEGATIVE mg/dL
Hgb urine dipstick: NEGATIVE
Ketones, ur: NEGATIVE mg/dL
NITRITE: NEGATIVE
Protein, ur: NEGATIVE mg/dL
SPECIFIC GRAVITY, URINE: 1.018 (ref 1.005–1.030)
UROBILINOGEN UA: 0.2 mg/dL (ref 0.0–1.0)
pH: 5 (ref 5.0–8.0)

## 2013-04-15 LAB — I-STAT TROPONIN, ED: TROPONIN I, POC: 0.01 ng/mL (ref 0.00–0.08)

## 2013-04-15 LAB — LIPASE, BLOOD: LIPASE: 46 U/L (ref 11–59)

## 2013-04-15 MED ORDER — DEXTROSE 5 % IV SOLN
1.0000 g | Freq: Once | INTRAVENOUS | Status: AC
  Administered 2013-04-15: 1 g via INTRAVENOUS
  Filled 2013-04-15: qty 10

## 2013-04-15 MED ORDER — SODIUM CHLORIDE 0.9 % IV SOLN
1000.0000 mL | INTRAVENOUS | Status: DC
  Administered 2013-04-15: 1000 mL via INTRAVENOUS

## 2013-04-15 MED ORDER — MORPHINE SULFATE 4 MG/ML IJ SOLN
4.0000 mg | Freq: Once | INTRAMUSCULAR | Status: AC
  Administered 2013-04-15: 4 mg via INTRAVENOUS
  Filled 2013-04-15: qty 1

## 2013-04-15 MED ORDER — SODIUM CHLORIDE 0.9 % IV SOLN
1000.0000 mL | Freq: Once | INTRAVENOUS | Status: AC
  Administered 2013-04-15: 1000 mL via INTRAVENOUS

## 2013-04-15 MED ORDER — IOHEXOL 300 MG/ML  SOLN
50.0000 mL | Freq: Once | INTRAMUSCULAR | Status: AC | PRN
  Administered 2013-04-15: 50 mL via ORAL

## 2013-04-15 MED ORDER — ONDANSETRON HCL 4 MG/2ML IJ SOLN
4.0000 mg | Freq: Once | INTRAMUSCULAR | Status: AC
  Administered 2013-04-15: 4 mg via INTRAVENOUS
  Filled 2013-04-15: qty 2

## 2013-04-15 NOTE — ED Provider Notes (Signed)
CSN: [id number]     Arrival date & time 04/15/13  1741 History   First MD Initiated Contact with Patient 04/15/13 1756     Chief Complaint  Patient presents with  . Abdominal Pain     HPI Pt was celebrating her birthday party.  She was sitting outside.  She told family she didn't want to eat and they noticed that her eyes were closed and they thought she had fallen asleep but others thought she may have slumped over.   Family thought her sugar was low so they tried to give her icing from the cake.She then had an episode of vomiting. Pt was unresponsive for a brief period of time.  Pt states she is now having abdominal pain.  She is not having chest pain or shortness of breath.  She is having a headache but no new weakness or numbness.  She has a history of a prior stroke affecting her right side but that is unchanged. Past Medical History  Diagnosis Date  . Myocardial infarction 04/2012  . DVT (deep venous thrombosis)     "?RLE; was on coumadin f"or 3 yrs"  . Pneumonia     "3 times" (01/19/2013)  . Exertional asthma   . Type II diabetes mellitus   . GERD (gastroesophageal reflux disease)   . Headache(784.0)     "q other week" (01/19/2013)  . Arthritis     "knees" (01/19/2013)  . Stroke 2005    R sided deficits  . Chronic diastolic CHF (congestive heart failure)   . Coronary artery disease    Past Surgical History  Procedure Laterality Date  . Tubal ligation    . Cataract extraction extracapsular Left   . Dilation and curettage of uterus    . Coronary angioplasty with stent placement  04/2012  . Coronary angioplasty  04/2012   Family History  Problem Relation Age of Onset  . Heart attack Mother   . Heart failure Mother   . Stomach cancer Father    History  Substance Use Topics  . Smoking status: Former Smoker -- 0.50 packs/day for 15 years  . Smokeless tobacco: Never Used  . Alcohol Use: No   OB History   Grav Para Term Preterm Abortions TAB SAB Ect Mult Living                  Review of Systems  All other systems reviewed and are negative.     Allergies  Penicillins  Home Medications   Current Outpatient Rx  Name  Route  Sig  Dispense  Refill  . aspirin EC 81 MG EC tablet   Oral   Take 1 tablet (81 mg total) by mouth daily.   30 tablet   3   . atorvastatin (LIPITOR) 20 MG tablet   Oral   Take 40 mg by mouth daily.          . clopidogrel (PLAVIX) 75 MG tablet   Oral   Take 75 mg by mouth daily.         . cyclobenzaprine (FLEXERIL) 10 MG tablet   Oral   Take 10 mg by mouth 3 (three) times daily.         . ferrous sulfate 325 (65 FE) MG tablet   Oral   Take 1 tablet (325 mg total) by mouth 3 (three) times daily with meals.   90 tablet   3   . FLUoxetine (PROZAC) 10 MG capsule   Oral  Take 10 mg by mouth daily.         Marland Kitchen lisinopril (PRINIVIL,ZESTRIL) 5 MG tablet   Oral   Take 5 mg by mouth 2 (two) times daily.          . magnesium hydroxide (MILK OF MAGNESIA) 400 MG/5ML suspension   Oral   Take 15 mLs by mouth daily as needed for mild constipation.         . metFORMIN (GLUCOPHAGE) 500 MG tablet   Oral   Take 500 mg by mouth 2 (two) times daily with a meal.         . metoprolol tartrate (LOPRESSOR) 12.5 mg TABS tablet   Oral   Take 1 tablet (25 mg total) by mouth 2 (two) times daily.   30 tablet   0   . nitroGLYCERIN (NITROSTAT) 0.4 MG SL tablet   Sublingual   Place 1 tablet (0.4 mg total) under the tongue every 5 (five) minutes x 3 doses as needed for chest pain.   25 tablet   3   . pantoprazole (PROTONIX) 40 MG tablet   Oral   Take 1 tablet (40 mg total) by mouth daily at 6 (six) AM.   30 tablet   3    BP 105/72  Pulse 105  Temp(Src) 98.6 F (37 C) (Oral)  Resp 20  SpO2 98% Physical Exam  Nursing note and vitals reviewed. Constitutional: She appears well-developed and well-nourished. No distress.  HENT:  Head: Normocephalic and atraumatic.  Right Ear: External ear normal.  Left Ear:  External ear normal.  Eyes: Conjunctivae are normal. Right eye exhibits no discharge. Left eye exhibits no discharge. No scleral icterus.  Neck: Neck supple. No tracheal deviation present.  Cardiovascular: Normal rate, regular rhythm and intact distal pulses.   Pulmonary/Chest: Effort normal and breath sounds normal. No stridor. No respiratory distress. She has no wheezes. She has no rales.  Abdominal: Soft. Bowel sounds are normal. She exhibits no distension. There is no tenderness. There is no rebound and no guarding.  Musculoskeletal: She exhibits no edema and no tenderness.  Neurological: She is alert. She displays atrophy (right sided). A sensory deficit is present. No cranial nerve deficit (aphasia, no facial droop, ). She exhibits normal muscle tone. She displays no seizure activity. Coordination normal. GCS eye subscore is 4. GCS verbal subscore is 5. GCS motor subscore is 6.  Right hemiparesis,  Skin: Skin is warm and dry. No rash noted.  Psychiatric: She has a normal mood and affect.    ED Course  Procedures (including critical care time) Labs Review Labs Reviewed  CBC WITH DIFFERENTIAL - Abnormal; Notable for the following:    WBC 10.6 (*)    Platelets 417 (*)    Neutro Abs 7.9 (*)    All other components within normal limits  COMPREHENSIVE METABOLIC PANEL - Abnormal; Notable for the following:    Sodium 135 (*)    Potassium 5.6 (*)    Chloride 94 (*)    Glucose, Bld 143 (*)    BUN 37 (*)    Creatinine, Ser 3.10 (*)    Calcium 10.6 (*)    Total Protein 9.2 (*)    GFR calc non Af Amer 14 (*)    GFR calc Af Amer 17 (*)    All other components within normal limits  URINALYSIS, ROUTINE W REFLEX MICROSCOPIC - Abnormal; Notable for the following:    APPearance CLOUDY (*)    Bilirubin Urine MODERATE (*)  Leukocytes, UA MODERATE (*)    All other components within normal limits  LIPASE, BLOOD  URINE MICROSCOPIC-ADD ON  I-STAT TROPOININ, ED   Imaging Review Ct Abdomen  Pelvis Wo Contrast  04/15/2013   CLINICAL DATA:  Abdominal pain, weakness, vomiting. Right-sided deficit from previous stroke. No IV contrast due to renal insufficiency.  EXAM: CT ABDOMEN AND PELVIS WITHOUT CONTRAST  TECHNIQUE: Multidetector CT imaging of the abdomen and pelvis was performed following the standard protocol without intravenous contrast.  COMPARISON:  06/17/2008  FINDINGS: Infiltration or atelectasis in both lung bases. Coronary artery calcifications.  Unenhanced appearance of the liver, spleen, gallbladder, pancreas, adrenal glands, inferior vena cava, and retroperitoneal lymph nodes are unremarkable. There are multiple hypodense lesions in both kidneys likely representing cysts. These were present on the previous study without significant change. Calcification of the abdominal aorta without aneurysm. The stomach, small bowel, and colon are not abnormally distended. No free air or free fluid in the abdomen.  Pelvis: Uterus and adnexal structures are not enlarged. No free pelvic fluid collections. Appendix is normal. No evidence of diverticulitis. No significant pelvic lymphadenopathy. No destructive bone lesions appreciated.  IMPRESSION: No acute process demonstrated in the abdomen or pelvis on unenhanced scans. There is infiltration or atelectasis demonstrated in both lung bases.   Electronically Signed   By: Burman NievesWilliam  Stevens M.D.   On: 04/15/2013 22:38   Ct Head Wo Contrast  04/15/2013   CLINICAL DATA:  Headache, vomiting, weakness  EXAM: CT HEAD WITHOUT CONTRAST  TECHNIQUE: Contiguous axial images were obtained from the base of the skull through the vertex without intravenous contrast.  COMPARISON:  CT HEAD W/O CM dated 01/19/2013; MR HEAD W/O CM dated 09/09/2010  FINDINGS: Stable chronic stool limit lacunar infarcts bilaterally. Stable area of encephalomalacia medial left occipital lobe. Mild to moderate diffuse atrophy with mild low attenuation in the deep white matter. No hydrocephalus. No  hemorrhage, extra-axial fluid, or acute infarct.  The calvarium is intact. Frontal sinuses are not formed. There is essentially complete opacification of ethmoid air cells. There is complete opacification of the visualized portion of the left maxillary sinus and near complete opacification of the right maxillary sinus with an air-fluid level limited. Sphenoid sinuses are essentially completely opacified.  IMPRESSION: Chronic brain abnormalities.  New severe pansinusitis.   Electronically Signed   By: Esperanza Heiraymond  Rubner M.D.   On: 04/15/2013 19:32   Dg Abd Acute W/chest  04/15/2013   CLINICAL DATA:  Abdominal pain, weakness, vomiting.  EXAM: ACUTE ABDOMEN SERIES (ABDOMEN 2 VIEW & CHEST 1 VIEW)  COMPARISON:  Chest radiograph 01/23/2013  FINDINGS: Numerous cardiac leads project over the lower chest and abdomen. Heart and mediastinal contours are stable. Pulmonary vascularity is within normal limits. There is mild left basilar atelectasis. No visible pleural effusion. No evidence of free intraperitoneal air on a decubitus right side up view. There is gaseous distention of the proximal colon. Cecum measures up to 6 cm transverse diameter. No small bowel dilatation is seen. Facet joint degenerative changes of the lower lumbar spine.  IMPRESSION: Mild dilatation of the proximal colon. Remainder of the bowel gas pattern is within normal limits.  Negative for free intraperitoneal air.  Mild left basilar atelectasis.   Electronically Signed   By: Britta MccreedySusan  Turner M.D.   On: 04/15/2013 19:27     EKG Interpretation   Date/Time:  Saturday April 15 2013 18:46:57 EDT Ventricular Rate:  98 PR Interval:  141 QRS Duration: 71 QT Interval:  351  QTC Calculation: 448 R Axis:   16 Text Interpretation:  Sinus rhythm Anterior infarct, age indeterminate  Since last tracing rate slower Confirmed by Devontae Casasola  MD-J, [place] Wachter [phone]) on  04/15/2013 6:57:11 PM     Medications  0.9 %  sodium chloride infusion (0 mLs Intravenous Stopped  04/15/13 1947)    Followed by  0.9 %  sodium chloride infusion (1,000 mLs Intravenous New Bag/Given 04/15/13 1948)  cefTRIAXone (ROCEPHIN) 1 g in dextrose 5 % 50 mL IVPB (1 g Intravenous New Bag/Given 04/15/13 2258)  ondansetron (ZOFRAN) injection 4 mg (4 mg Intravenous Given 04/15/13 1834)  morphine 4 MG/ML injection 4 mg (4 mg Intravenous Given 04/15/13 1834)  iohexol (OMNIPAQUE) 300 MG/ML solution 50 mL (50 mLs Oral Contrast Given 04/15/13 1955)    MDM   Final diagnoses:  Acute on chronic renal failure  Hypercalcemia  Syncope  UTI (urinary tract infection)    Pt has a UTI as well as dehydration associated with acute renal insufficiency.  Pt had a synocpal episodes earlier.  Likely related to the UTI and renal insufficiency.  IV fluids and abx given in the ED.  Plan on admission, IV fluids, observation.   Celene Kras, MD 04/15/13 [phone]

## 2013-04-15 NOTE — ED Notes (Signed)
Per EMS, was at her birthday party when she got weak, vomited and that's when abdomin started hurting-right sided deficit from previous stroke-CBG 220

## 2013-04-15 NOTE — ED Notes (Signed)
Patient is aware we need a urine specimen. 

## 2013-04-15 NOTE — ED Notes (Signed)
Bed: [id number] Expected date: 04/15/13 Expected time: 5:39 PM Means of arrival:  Comments: abd pain

## 2013-04-15 NOTE — ED Notes (Signed)
Off floor for testing 

## 2013-04-15 NOTE — H&P (Signed)
PCP: Willey Blade, MD  Card[facility] [place]  Chief Complaint:  syncope  HPI: [name] is a 68 y.o. female   has a past medical history of Myocardial infarction (04/2012); DVT (deep venous thrombosis); Pneumonia; Exertional asthma; Type II diabetes mellitus; GERD (gastroesophageal reflux disease); Headache(784.0); Arthritis; Stroke (2005); Chronic diastolic CHF (congestive heart failure); and Coronary artery disease.   Presented with  patient was celebrating her birthday. Family noted her slumped over. At first they though her blood sugar was low and attempted to feed her some cake frosting. Patient eventually came to it but began vomiting. Patient cannot recollect the event, no family at bedside. Hx obtained through ER notes. Patient denies chest pain or shortness of breath states currently base to baseline. She has hx of right hemiparesis and uses walker at baseline. Work up in ER showed UTI and CT head un changed from prior showing old CVA. Troponin unremarkable.  Denies any fever or chills.  She intially endorsed abdominal pain but CT of abdomen was unremarkable.  Review of Systems:     Pertinent positives include: syncope abdominal pain, nausea, vomiting,   Constitutional:  No weight loss, night sweats, Fevers, chills, fatigue, weight loss  HEENT:  No headaches, Difficulty swallowing,Tooth/dental problems,Sore throat,  No sneezing, itching, ear ache, nasal congestion, post nasal drip,  Cardio-vascular:  No chest pain, Orthopnea, PND, anasarca, dizziness, palpitations.no Bilateral lower extremity swelling  GI:  No heartburn, indigestion,diarrhea, change in bowel habits, loss of appetite, melena, blood in stool, hematemesis Resp:  no shortness of breath at rest. No dyspnea on exertion, No excess mucus, no productive cough, No non-productive cough, No coughing up of blood.No change in color of mucus.No wheezing. Skin:  no rash or lesions. No jaundice GU:  no dysuria, change in color of  urine, no urgency or frequency. No straining to urinate.  No flank pain.  Musculoskeletal:  No joint pain or no joint swelling. No decreased range of motion. No back pain.  Psych:  No change in mood or affect. No depression or anxiety. No memory loss.  Neuro: no localizing neurological complaints, no tingling, no weakness, no double vision, no gait abnormality, no slurred speech, no confusion  Otherwise ROS are negative except for above, 10 systems were reviewed  Past Medical History: Past Medical History  Diagnosis Date  . Myocardial infarction 04/2012  . DVT (deep venous thrombosis)     "?RLE; was on coumadin f"or 3 yrs"  . Pneumonia     "3 times" (01/19/2013)  . Exertional asthma   . Type II diabetes mellitus   . GERD (gastroesophageal reflux disease)   . Headache(784.0)     "q other week" (01/19/2013)  . Arthritis     "knees" (01/19/2013)  . Stroke 2005    R sided deficits  . Chronic diastolic CHF (congestive heart failure)   . Coronary artery disease    Past Surgical History  Procedure Laterality Date  . Tubal ligation    . Cataract extraction extracapsular Left   . Dilation and curettage of uterus    . Coronary angioplasty with stent placement  04/2012  . Coronary angioplasty  04/2012     Medications: Prior to Admission medications   Medication Sig Start Date End Date Taking? Authorizing Provider  aspirin EC 81 MG EC tablet Take 1 tablet (81 mg total) by mouth daily. 04/07/12  Yes Robynn Pane, MD  atorvastatin (LIPITOR) 20 MG tablet Take 40 mg by mouth daily.    Yes Historical Provider,  MD  clopidogrel (PLAVIX) 75 MG tablet Take 75 mg by mouth daily.   Yes Historical Provider, MD  cyclobenzaprine (FLEXERIL) 10 MG tablet Take 10 mg by mouth 3 (three) times daily.   Yes Historical Provider, MD  ferrous sulfate 325 (65 FE) MG tablet Take 1 tablet (325 mg total) by mouth 3 (three) times daily with meals. 04/07/12  Yes Robynn Pane, MD  FLUoxetine (PROZAC) 10 MG capsule  Take 10 mg by mouth daily.   Yes Historical Provider, MD  lisinopril (PRINIVIL,ZESTRIL) 5 MG tablet Take 5 mg by mouth 2 (two) times daily.    Yes Historical Provider, MD  magnesium hydroxide (MILK OF MAGNESIA) 400 MG/5ML suspension Take 15 mLs by mouth daily as needed for mild constipation.   Yes Historical Provider, MD  metFORMIN (GLUCOPHAGE) 500 MG tablet Take 500 mg by mouth 2 (two) times daily with a meal.   Yes Historical Provider, MD  metoprolol tartrate (LOPRESSOR) 12.5 mg TABS tablet Take 1 tablet (25 mg total) by mouth 2 (two) times daily. 01/27/13  Yes Marinda Elk, MD  nitroGLYCERIN (NITROSTAT) 0.4 MG SL tablet Place 1 tablet (0.4 mg total) under the tongue every 5 (five) minutes x 3 doses as needed for chest pain. 04/07/12  Yes Robynn Pane, MD  pantoprazole (PROTONIX) 40 MG tablet Take 1 tablet (40 mg total) by mouth daily at 6 (six) AM. 04/07/12  Yes Robynn Pane, MD    Allergies:   Allergies  Allergen Reactions  . Penicillins Nausea And Vomiting    Social History:  Ambulatory with walker  Lives at  Home with family   reports that she has quit smoking. She has never used smokeless tobacco. She reports that she does not drink alcohol or use illicit drugs.   Family History: family history includes Heart attack in her mother; Heart failure in her mother; Stomach cancer in her father.    Physical Exam: Patient Vitals for the past 24 hrs:  BP Temp Temp src Pulse Resp SpO2  04/15/13 1749 105/72 mmHg 98.6 F (37 C) Oral 105 20 98 %    1. General:  in No Acute distress 2. Psychological: Alert and   Oriented 3. Head/ENT:   Moist  Mucous Membranes                          Head Non traumatic, neck supple                            Poor Dentition 4. SKIN:  decreased Skin turgor,  Skin clean Dry and intact no rash 5. Heart: Regular rate and rhythm no Murmur, Rub or gallop 6. Lungs: Clear to auscultation bilaterally, no wheezes or crackles   7. Abdomen: Soft,  non-tender, Non distended 8. Lower extremities: no clubbing, cyanosis, or edema 9. Neurologically right side weakness chronic, CN noted to have diminished right eye movement 10. MSK: Normal range of motion  body mass index is unknown because there is no weight on file.   Labs on Admission:   Recent Labs  04/15/13 1830  NA 135*  K 5.6*  CL 94*  CO2 25  GLUCOSE 143*  BUN 37*  CREATININE 3.10*  CALCIUM 10.6*    Recent Labs  04/15/13 1830  AST 22  ALT 7  ALKPHOS 101  BILITOT 0.4  PROT 9.2*  ALBUMIN 3.9    Recent Labs  04/15/13  1830  LIPASE 46    Recent Labs  04/15/13 1830  WBC 10.6*  NEUTROABS 7.9*  HGB 12.4  HCT 37.6  MCV 90.0  PLT 417*   No results found for this basename: CKTOTAL, CKMB, CKMBINDEX, TROPONINI,  in the last 72 hours No results found for this basename: TSH, T4TOTAL, FREET3, T3FREE, THYROIDAB,  in the last 72 hours No results found for this basename: VITAMINB12, FOLATE, FERRITIN, TIBC, IRON, RETICCTPCT,  in the last 72 hours Lab Results  Component Value Date   HGBA1C 6.0* 01/25/2013    The CrCl is unknown because both a height and weight (above a minimum accepted value) are required for this calculation. ABG    Component Value Date/Time   HCO3 21.2 03/01/2007 1435   TCO2 23 01/19/2013 0315   ACIDBASEDEF 4.0* 03/01/2007 1435     Lab Results  Component Value Date   DDIMER 1.20* 04/04/2012     Other results:  I have pearsonaly reviewed this: ECG REPORT  Rate: 98  Rhythm: NSR ST&T Change: no ischemia  UA evidence of UTI    Cultures:    Component Value Date/Time   SDES URINE, RANDOM 01/23/2013 1446   SPECREQUEST NONE 01/23/2013 1446   CULT  Value: CITROBACTER FREUNDII Performed at [facility] 01/23/2013 1446   REPTSTATUS 01/25/2013 FINAL 01/23/2013 1446       Radiological Exams on Admission: Ct Abdomen Pelvis Wo Contrast  04/15/2013   CLINICAL DATA:  Abdominal pain, weakness, vomiting. Right-sided deficit from  previous stroke. No IV contrast due to renal insufficiency.  EXAM: CT ABDOMEN AND PELVIS WITHOUT CONTRAST  TECHNIQUE: Multidetector CT imaging of the abdomen and pelvis was performed following the standard protocol without intravenous contrast.  COMPARISON:  06/17/2008  FINDINGS: Infiltration or atelectasis in both lung bases. Coronary artery calcifications.  Unenhanced appearance of the liver, spleen, gallbladder, pancreas, adrenal glands, inferior vena cava, and retroperitoneal lymph nodes are unremarkable. There are multiple hypodense lesions in both kidneys likely representing cysts. These were present on the previous study without significant change. Calcification of the abdominal aorta without aneurysm. The stomach, small bowel, and colon are not abnormally distended. No free air or free fluid in the abdomen.  Pelvis: Uterus and adnexal structures are not enlarged. No free pelvic fluid collections. Appendix is normal. No evidence of diverticulitis. No significant pelvic lymphadenopathy. No destructive bone lesions appreciated.  IMPRESSION: No acute process demonstrated in the abdomen or pelvis on unenhanced scans. There is infiltration or atelectasis demonstrated in both lung bases.   Electronically Signed   By: Burman Nieves M.D.   On: 04/15/2013 22:38   Ct Head Wo Contrast  04/15/2013   CLINICAL DATA:  Headache, vomiting, weakness  EXAM: CT HEAD WITHOUT CONTRAST  TECHNIQUE: Contiguous axial images were obtained from the base of the skull through the vertex without intravenous contrast.  COMPARISON:  CT HEAD W/O CM dated 01/19/2013; MR HEAD W/O CM dated 09/09/2010  FINDINGS: Stable chronic stool limit lacunar infarcts bilaterally. Stable area of encephalomalacia medial left occipital lobe. Mild to moderate diffuse atrophy with mild low attenuation in the deep white matter. No hydrocephalus. No hemorrhage, extra-axial fluid, or acute infarct.  The calvarium is intact. Frontal sinuses are not formed. There  is essentially complete opacification of ethmoid air cells. There is complete opacification of the visualized portion of the left maxillary sinus and near complete opacification of the right maxillary sinus with an air-fluid level limited. Sphenoid sinuses are essentially completely opacified.  IMPRESSION:  Chronic brain abnormalities.  New severe pansinusitis.   Electronically Signed   By: Esperanza Heiraymond  Rubner M.D.   On: 04/15/2013 19:32   Dg Abd Acute W/chest  04/15/2013   CLINICAL DATA:  Abdominal pain, weakness, vomiting.  EXAM: ACUTE ABDOMEN SERIES (ABDOMEN 2 VIEW & CHEST 1 VIEW)  COMPARISON:  Chest radiograph 01/23/2013  FINDINGS: Numerous cardiac leads project over the lower chest and abdomen. Heart and mediastinal contours are stable. Pulmonary vascularity is within normal limits. There is mild left basilar atelectasis. No visible pleural effusion. No evidence of free intraperitoneal air on a decubitus right side up view. There is gaseous distention of the proximal colon. Cecum measures up to 6 cm transverse diameter. No small bowel dilatation is seen. Facet joint degenerative changes of the lower lumbar spine.  IMPRESSION: Mild dilatation of the proximal colon. Remainder of the bowel gas pattern is within normal limits.  Negative for free intraperitoneal air.  Mild left basilar atelectasis.   Electronically Signed   By: Britta MccreedySusan  Turner M.D.   On: 04/15/2013 19:27    Chart has been reviewed  Assessment/Plan  68 yo F with hxof CHF, CAD here with syncope in the setting of acute on chronic renal failure and UTI  Present on Admission:  . Syncope - likely multifactorial. We'll admit to telemetry. Cycle cardiac enzymes to her carotid Dopplers and echo gram. Check orthostatics, patient is chronic neurological deficits which is unchanged from baseline  . UTI (lower urinary tract infection) - with Rocephin await results of urine culture  . Coronary artery disease - see home medication cycle cardiac enzymes  .  Chronic diastolic CHF (congestive heart failure) - avoid fluid overload and give gentle fluids  . Diabetes mellitus - old metformin continue sliding scale  . Acute on chronic renal failure - ancillary fluids, stop her ACE inhibitor, obtain urine  . Hyperkalemia - bolus stabilized a repeat and treat if needed   Prophylaxis:   Lovenox  CODE STATUS: FULL CODE  Other plan as per orders.  I have spent a total of 55 min on this admission  Jaxson Keener 04/15/2013, 11:42 PM

## 2013-04-16 ENCOUNTER — Encounter (HOSPITAL_COMMUNITY): Payer: Self-pay | Admitting: *Deleted

## 2013-04-16 DIAGNOSIS — D649 Anemia, unspecified: Secondary | ICD-10-CM

## 2013-04-16 DIAGNOSIS — N179 Acute kidney failure, unspecified: Secondary | ICD-10-CM | POA: Diagnosis present

## 2013-04-16 DIAGNOSIS — F3289 Other specified depressive episodes: Secondary | ICD-10-CM

## 2013-04-16 DIAGNOSIS — I5032 Chronic diastolic (congestive) heart failure: Secondary | ICD-10-CM

## 2013-04-16 DIAGNOSIS — N189 Chronic kidney disease, unspecified: Secondary | ICD-10-CM

## 2013-04-16 DIAGNOSIS — I509 Heart failure, unspecified: Secondary | ICD-10-CM

## 2013-04-16 DIAGNOSIS — F329 Major depressive disorder, single episode, unspecified: Secondary | ICD-10-CM

## 2013-04-16 DIAGNOSIS — I2510 Atherosclerotic heart disease of native coronary artery without angina pectoris: Secondary | ICD-10-CM

## 2013-04-16 DIAGNOSIS — E875 Hyperkalemia: Secondary | ICD-10-CM | POA: Diagnosis present

## 2013-04-16 LAB — TROPONIN I
Troponin I: <0.3 ng/mL (ref <0.30)
Troponin I: <0.3 ng/mL (ref <0.30)
Troponin I: <0.3 ng/mL (ref <0.30)

## 2013-04-16 LAB — BASIC METABOLIC PANEL
BUN: 33 mg/dL — AB (ref 6–23)
CHLORIDE: 102 meq/L (ref 96–112)
CO2: 22 mEq/L (ref 19–32)
CREATININE: 2.87 mg/dL — AB (ref 0.50–1.10)
Calcium: 8.5 mg/dL (ref 8.4–10.5)
GFR calc Af Amer: 18 mL/min — ABNORMAL LOW (ref >90)
GFR calc non Af Amer: 16 mL/min — ABNORMAL LOW (ref >90)
Glucose, Bld: 124 mg/dL — ABNORMAL HIGH (ref 70–99)
Potassium: 5.1 mEq/L (ref 3.7–5.3)
Sodium: 137 mEq/L (ref 137–147)

## 2013-04-16 LAB — COMPREHENSIVE METABOLIC PANEL
ALT: <5 U/L (ref 0–35)
AST: 11 U/L (ref 0–37)
Albumin: 2.6 g/dL — ABNORMAL LOW (ref 3.5–5.2)
Alkaline Phosphatase: 68 U/L (ref 39–117)
BUN: 31 mg/dL — AB (ref 6–23)
CO2: 20 mEq/L (ref 19–32)
Calcium: 8.3 mg/dL — ABNORMAL LOW (ref 8.4–10.5)
Chloride: 104 mEq/L (ref 96–112)
Creatinine, Ser: 2.68 mg/dL — ABNORMAL HIGH (ref 0.50–1.10)
GFR calc Af Amer: 20 mL/min — ABNORMAL LOW (ref >90)
GFR calc non Af Amer: 17 mL/min — ABNORMAL LOW (ref >90)
Glucose, Bld: 73 mg/dL (ref 70–99)
POTASSIUM: 4.4 meq/L (ref 3.7–5.3)
Sodium: 138 mEq/L (ref 137–147)
TOTAL PROTEIN: 6.3 g/dL (ref 6.0–8.3)
Total Bilirubin: <0.2 mg/dL — ABNORMAL LOW (ref 0.3–1.2)

## 2013-04-16 LAB — GLUCOSE, CAPILLARY
GLUCOSE-CAPILLARY: 98 mg/dL (ref 70–99)
GLUCOSE-CAPILLARY: 81 mg/dL (ref 70–99)
GLUCOSE-CAPILLARY: 148 mg/dL — AB (ref 70–99)
Glucose-Capillary: 72 mg/dL (ref 70–99)

## 2013-04-16 LAB — CBC
HEMATOCRIT: 28.5 % — AB (ref 36.0–46.0)
Hemoglobin: 9 g/dL — ABNORMAL LOW (ref 12.0–15.0)
MCH: 28.8 pg (ref 26.0–34.0)
MCHC: 31.6 g/dL (ref 30.0–36.0)
MCV: 91.1 fL (ref 78.0–100.0)
Platelets: 279 10*3/uL (ref 150–400)
RBC: 3.13 MIL/uL — AB (ref 3.87–5.11)
RDW: 13.7 % (ref 11.5–15.5)
WBC: 10.2 10*3/uL (ref 4.0–10.5)

## 2013-04-16 LAB — MAGNESIUM: MAGNESIUM: 1.1 mg/dL — AB (ref 1.5–2.5)

## 2013-04-16 LAB — HEMOGLOBIN A1C
Hgb A1c MFr Bld: 5.6 % (ref <5.7)
MEAN PLASMA GLUCOSE: 114 mg/dL (ref <117)

## 2013-04-16 LAB — TSH: TSH: 0.913 u[IU]/mL (ref 0.350–4.500)

## 2013-04-16 LAB — CREATININE, URINE, RANDOM: Creatinine, Urine: 109.9 mg/dL

## 2013-04-16 LAB — PHOSPHORUS: Phosphorus: 2.1 mg/dL — ABNORMAL LOW (ref 2.3–4.6)

## 2013-04-16 MED ORDER — ZOLPIDEM TARTRATE 5 MG PO TABS
5.0000 mg | ORAL_TABLET | Freq: Once | ORAL | Status: AC
  Administered 2013-04-16: 5 mg via ORAL
  Filled 2013-04-16: qty 1

## 2013-04-16 MED ORDER — NITROGLYCERIN 0.4 MG SL SUBL: 0.4000 mg | SUBLINGUAL_TABLET | SUBLINGUAL | Status: DC | PRN

## 2013-04-16 MED ORDER — ACETAMINOPHEN 325 MG PO TABS
650.0000 mg | ORAL_TABLET | Freq: Four times a day (QID) | ORAL | Status: DC | PRN
  Administered 2013-04-19: 650 mg via ORAL
  Filled 2013-04-16 (×2): qty 2

## 2013-04-16 MED ORDER — FLUOXETINE HCL 10 MG PO CAPS
10.0000 mg | ORAL_CAPSULE | Freq: Every day | ORAL | Status: DC
  Administered 2013-04-16 – 2013-04-20 (×5): 10 mg via ORAL
  Filled 2013-04-16 (×5): qty 1

## 2013-04-16 MED ORDER — INSULIN ASPART 100 UNIT/ML ~~LOC~~ SOLN
0.0000 [IU] | Freq: Three times a day (TID) | SUBCUTANEOUS | Status: DC
  Administered 2013-04-16 – 2013-04-17 (×2): 1 [IU] via SUBCUTANEOUS
  Administered 2013-04-18: 2 [IU] via SUBCUTANEOUS

## 2013-04-16 MED ORDER — SODIUM CHLORIDE 0.9 % IV SOLN
INTRAVENOUS | Status: DC
  Administered 2013-04-16: 21:00:00 via INTRAVENOUS

## 2013-04-16 MED ORDER — FERROUS SULFATE 325 (65 FE) MG PO TABS
325.0000 mg | ORAL_TABLET | Freq: Three times a day (TID) | ORAL | Status: DC
  Administered 2013-04-16 – 2013-04-20 (×12): 325 mg via ORAL
  Filled 2013-04-16 (×16): qty 1

## 2013-04-16 MED ORDER — ENOXAPARIN SODIUM 30 MG/0.3ML ~~LOC~~ SOLN
30.0000 mg | SUBCUTANEOUS | Status: DC
  Administered 2013-04-16: 30 mg via SUBCUTANEOUS
  Filled 2013-04-16 (×2): qty 0.3

## 2013-04-16 MED ORDER — ASPIRIN EC 81 MG PO TBEC
81.0000 mg | DELAYED_RELEASE_TABLET | Freq: Every day | ORAL | Status: DC
  Administered 2013-04-16 – 2013-04-20 (×5): 81 mg via ORAL
  Filled 2013-04-16 (×5): qty 1

## 2013-04-16 MED ORDER — CLOPIDOGREL BISULFATE 75 MG PO TABS
75.0000 mg | ORAL_TABLET | Freq: Every day | ORAL | Status: DC
  Administered 2013-04-16 – 2013-04-20 (×5): 75 mg via ORAL
  Filled 2013-04-16 (×6): qty 1

## 2013-04-16 MED ORDER — ONDANSETRON HCL 4 MG/2ML IJ SOLN
4.0000 mg | Freq: Four times a day (QID) | INTRAMUSCULAR | Status: DC | PRN
  Administered 2013-04-19: 4 mg via INTRAVENOUS
  Filled 2013-04-16: qty 2

## 2013-04-16 MED ORDER — SODIUM CHLORIDE 0.9 % IV SOLN
INTRAVENOUS | Status: AC
  Administered 2013-04-16: 02:00:00 via INTRAVENOUS

## 2013-04-16 MED ORDER — PANTOPRAZOLE SODIUM 40 MG PO TBEC
40.0000 mg | DELAYED_RELEASE_TABLET | Freq: Every day | ORAL | Status: DC
  Administered 2013-04-16 – 2013-04-20 (×5): 40 mg via ORAL
  Filled 2013-04-16 (×5): qty 1

## 2013-04-16 MED ORDER — CYCLOBENZAPRINE HCL 10 MG PO TABS
10.0000 mg | ORAL_TABLET | Freq: Three times a day (TID) | ORAL | Status: DC
  Administered 2013-04-16 – 2013-04-20 (×11): 10 mg via ORAL
  Filled 2013-04-16 (×15): qty 1

## 2013-04-16 MED ORDER — INSULIN ASPART 100 UNIT/ML ~~LOC~~ SOLN: 0.0000 [IU] | Freq: Every day | SUBCUTANEOUS | Status: DC

## 2013-04-16 MED ORDER — ACETAMINOPHEN 650 MG RE SUPP: 650.0000 mg | Freq: Four times a day (QID) | RECTAL | Status: DC | PRN

## 2013-04-16 MED ORDER — METOPROLOL TARTRATE 25 MG PO TABS
25.0000 mg | ORAL_TABLET | Freq: Two times a day (BID) | ORAL | Status: DC
  Administered 2013-04-16 – 2013-04-20 (×9): 25 mg via ORAL
  Filled 2013-04-16 (×10): qty 1

## 2013-04-16 MED ORDER — HYDROCODONE-ACETAMINOPHEN 5-325 MG PO TABS: 1.0000 | ORAL_TABLET | ORAL | Status: DC | PRN

## 2013-04-16 MED ORDER — ATORVASTATIN CALCIUM 40 MG PO TABS
40.0000 mg | ORAL_TABLET | Freq: Every day | ORAL | Status: DC
  Administered 2013-04-16 – 2013-04-20 (×5): 40 mg via ORAL
  Filled 2013-04-16 (×5): qty 1

## 2013-04-16 MED ORDER — DEXTROSE 5 % IV SOLN
1.0000 g | INTRAVENOUS | Status: DC
  Administered 2013-04-16 – 2013-04-19 (×4): 1 g via INTRAVENOUS
  Filled 2013-04-16 (×5): qty 10

## 2013-04-16 MED ORDER — SODIUM CHLORIDE 0.9 % IJ SOLN
3.0000 mL | Freq: Two times a day (BID) | INTRAMUSCULAR | Status: DC
Start: 2013-04-16 — End: 2013-04-20
  Administered 2013-04-16 – 2013-04-20 (×8): 3 mL via INTRAVENOUS

## 2013-04-16 MED ORDER — ONDANSETRON HCL 4 MG PO TABS: 4.0000 mg | ORAL_TABLET | Freq: Four times a day (QID) | ORAL | Status: DC | PRN

## 2013-04-16 MED ORDER — DOCUSATE SODIUM 100 MG PO CAPS
100.0000 mg | ORAL_CAPSULE | Freq: Two times a day (BID) | ORAL | Status: DC
Start: 2013-04-16 — End: 2013-04-20
  Administered 2013-04-16 – 2013-04-20 (×9): 100 mg via ORAL
  Filled 2013-04-16 (×10): qty 1

## 2013-04-16 NOTE — Progress Notes (Signed)
[facility] PROGRESS NOTE  [name] ZOX:[id number] DOB: 20-Apr-1945 DOA: 04/15/2013 PCP: Willey Blade, MD  Assessment/Plan: Syncope  -? Vasovagal vs arrhythmias -monitor on tele -cycle cardiac enzymes -check ECHO -Check orthostatics,  -patient is chronic neurological deficits which is unchanged from baseline   . UTI  - continue Rocephin await results of urine culture   . Coronary artery disease  - continue plavix/BB/statin  . Chronic diastolic CHF -gentle IVf for now  . Diabetes mellitus  -stop metformin  -continue sliding scale   . Acute on chronic renal failure -gentle IVF, stop ACE -baseline 2-2.5  . Hyperkalemia -due to AKI and ACE -resolved, given kayexalate  Hx of CVA:  - residual R hemiparesis - Continue plavix and statins   Prophylaxis: Lovenox  Code Status: Full Code Family Communication: none at bedside Disposition Plan: home when improved   Antibiotics: Ceftriaxone  HPI/Subjective: Feels ok, no abd pain, no N/V/D  Objective: Filed Vitals:   04/16/13 0606  BP: 91/52  Pulse: 82  Temp: 98.1 F (36.7 C)  Resp: 16    Intake/Output Summary (Last 24 hours) at 04/16/13 0814 Last data filed at 04/16/13 0719  Gross per 24 hour  Intake      0 ml  Output    550 ml  Net   -550 ml   Filed Weights   04/16/13 0146  Weight: 49.1 kg (108 lb 3.9 oz)    Exam:   General:  AAOx3, flat affect  Cardiovascular: S1S2/RRR  Respiratory: CTAB  Abdomen: soft, Nt, ND, BS present  Musculoskeletal: no edema c/c  Neuro: R hemiparesis   Data Reviewed: Basic Metabolic Panel:  Recent Labs Lab 04/15/13 1830 04/15/13 2317  NA 135* 137  K 5.6* 5.1  CL 94* 102  CO2 25 22  GLUCOSE 143* 124*  BUN 37* 33*  CREATININE 3.10* 2.87*  CALCIUM 10.6* 8.5   Liver Function Tests:  Recent Labs Lab 04/15/13 1830  AST 22  ALT 7  ALKPHOS 101  BILITOT 0.4  PROT 9.2*  ALBUMIN 3.9    Recent Labs Lab 04/15/13 1830  LIPASE 46   No results  found for this basename: AMMONIA,  in the last 168 hours CBC:  Recent Labs Lab 04/15/13 1830  WBC 10.6*  NEUTROABS 7.9*  HGB 12.4  HCT 37.6  MCV 90.0  PLT 417*   Cardiac Enzymes:  Recent Labs Lab 04/16/13 0220  TROPONINI <0.30   BNP (last 3 results)  Recent Labs  01/22/13 1850 01/23/13 0230  PROBNP 5790.0* 6000.0*   CBG:  Recent Labs Lab 04/16/13 0154 04/16/13 0748  GLUCAP 98 72    No results found for this or any previous visit (from the past 240 hour(s)).   Studies: Ct Abdomen Pelvis Wo Contrast  04/15/2013   CLINICAL DATA:  Abdominal pain, weakness, vomiting. Right-sided deficit from previous stroke. No IV contrast due to renal insufficiency.  EXAM: CT ABDOMEN AND PELVIS WITHOUT CONTRAST  TECHNIQUE: Multidetector CT imaging of the abdomen and pelvis was performed following the standard protocol without intravenous contrast.  COMPARISON:  06/17/2008  FINDINGS: Infiltration or atelectasis in both lung bases. Coronary artery calcifications.  Unenhanced appearance of the liver, spleen, gallbladder, pancreas, adrenal glands, inferior vena cava, and retroperitoneal lymph nodes are unremarkable. There are multiple hypodense lesions in both kidneys likely representing cysts. These were present on the previous study without significant change. Calcification of the abdominal aorta without aneurysm. The stomach, small bowel, and colon are not abnormally distended. No free  air or free fluid in the abdomen.  Pelvis: Uterus and adnexal structures are not enlarged. No free pelvic fluid collections. Appendix is normal. No evidence of diverticulitis. No significant pelvic lymphadenopathy. No destructive bone lesions appreciated.  IMPRESSION: No acute process demonstrated in the abdomen or pelvis on unenhanced scans. There is infiltration or atelectasis demonstrated in both lung bases.   Electronically Signed   By: Burman NievesWilliam  Stevens M.D.   On: 04/15/2013 22:38   Ct Head Wo  Contrast  04/15/2013   CLINICAL DATA:  Headache, vomiting, weakness  EXAM: CT HEAD WITHOUT CONTRAST  TECHNIQUE: Contiguous axial images were obtained from the base of the skull through the vertex without intravenous contrast.  COMPARISON:  CT HEAD W/O CM dated 01/19/2013; MR HEAD W/O CM dated 09/09/2010  FINDINGS: Stable chronic stool limit lacunar infarcts bilaterally. Stable area of encephalomalacia medial left occipital lobe. Mild to moderate diffuse atrophy with mild low attenuation in the deep white matter. No hydrocephalus. No hemorrhage, extra-axial fluid, or acute infarct.  The calvarium is intact. Frontal sinuses are not formed. There is essentially complete opacification of ethmoid air cells. There is complete opacification of the visualized portion of the left maxillary sinus and near complete opacification of the right maxillary sinus with an air-fluid level limited. Sphenoid sinuses are essentially completely opacified.  IMPRESSION: Chronic brain abnormalities.  New severe pansinusitis.   Electronically Signed   By: Esperanza Heiraymond  Rubner M.D.   On: 04/15/2013 19:32   Dg Abd Acute W/chest  04/15/2013   CLINICAL DATA:  Abdominal pain, weakness, vomiting.  EXAM: ACUTE ABDOMEN SERIES (ABDOMEN 2 VIEW & CHEST 1 VIEW)  COMPARISON:  Chest radiograph 01/23/2013  FINDINGS: Numerous cardiac leads project over the lower chest and abdomen. Heart and mediastinal contours are stable. Pulmonary vascularity is within normal limits. There is mild left basilar atelectasis. No visible pleural effusion. No evidence of free intraperitoneal air on a decubitus right side up view. There is gaseous distention of the proximal colon. Cecum measures up to 6 cm transverse diameter. No small bowel dilatation is seen. Facet joint degenerative changes of the lower lumbar spine.  IMPRESSION: Mild dilatation of the proximal colon. Remainder of the bowel gas pattern is within normal limits.  Negative for free intraperitoneal air.  Mild left  basilar atelectasis.   Electronically Signed   By: Britta MccreedySusan  Turner M.D.   On: 04/15/2013 19:27    Scheduled Meds: . aspirin EC  81 mg Oral Daily  . atorvastatin  40 mg Oral Daily  . cefTRIAXone (ROCEPHIN)  IV  1 g Intravenous Q24H  . clopidogrel  75 mg Oral Q breakfast  . cyclobenzaprine  10 mg Oral TID  . docusate sodium  100 mg Oral BID  . enoxaparin (LOVENOX) injection  30 mg Subcutaneous Q24H  . ferrous sulfate  325 mg Oral TID PC  . FLUoxetine  10 mg Oral Daily  . insulin aspart  0-5 Units Subcutaneous QHS  . insulin aspart  0-9 Units Subcutaneous TID WC  . metoprolol tartrate  25 mg Oral BID  . pantoprazole  40 mg Oral Daily  . sodium chloride  3 mL Intravenous Q12H   Continuous Infusions: . sodium chloride 50 mL/hr at 04/16/13 0137   Antibiotics Given (last 72 hours)   None      Active Problems:   Coronary artery disease   Chronic diastolic CHF (congestive heart failure)   Syncope   UTI (lower urinary tract infection)   Diabetes mellitus   Acute  on chronic renal failure   Hyperkalemia    Time spent:    Zannie Cove  [facility] Pager [phone]. If 7PM-7AM, please contact night-coverage at [email or web address], password [facility] 04/16/2013, 8:14 AM  LOS: 1 day

## 2013-04-16 NOTE — Evaluation (Signed)
Physical Therapy Evaluation Patient Details Name: [name] MRN: [id number] DOB: May 30, 1945 Today's Date: 04/16/2013   History of Present Illness  pt has a has a past medical history of Myocardial infarction (04/2012); DVT (deep venous thrombosis); Pneumonia; Exertional asthma; Type II diabetes mellitus; GERD (gastroesophageal reflux disease); Headache(784.0); Arthritis; Stroke (2005); Chronic diastolic CHF (congestive heart failure); and Coronary artery disease.  Presented with syncope.  Clinical Impression  Pt presents with impairments in balance, gait, mobility and strength and will benefit from skilled PT services to address deficits and decrease burden of care at home.    Follow Up Recommendations Home health PT;Supervision/Assistance - 24 hour    Equipment Recommendations  None recommended by PT    Recommendations for Other Services       Precautions / Restrictions Precautions Precautions: Fall Precaution Comments: bed alarm Restrictions Weight Bearing Restrictions: No      Mobility  Bed Mobility Overal bed mobility: Needs Assistance Bed Mobility: Sit to Supine;Supine to Sit     Supine to sit: Min assist Sit to supine: Min assist   General bed mobility comments: assist for B LEs and trunk  Transfers Overall transfer level: Needs assistance Equipment used: Rolling walker (2 wheeled) Transfers: Sit to/from Stand Sit to Stand: Mod assist         General transfer comment: lifting assist, cues for safe hand placement, manual facilitation for anterior wt shift  Ambulation/Gait Ambulation/Gait assistance: Mod assist Ambulation Distance (Feet): 5 Feet Assistive device: Rolling walker (2 wheeled)     Gait velocity interpretation: Below normal speed for age/gender General Gait Details: pt requires assistance for wt shifts with stepping forward and laterally.  narrow BOS, decreased step length B  Stairs            Wheelchair Mobility    Modified Rankin  (Stroke Patients Only)       Balance                                             Pertinent Vitals/Pain No c/o pain    Home Living Family/patient expects to be discharged to:: Private residence Living Arrangements: Spouse/significant other Available Help at Discharge: Family;Available 24 hours/day Type of Home: House Home Access: Level entry     Home Layout: Two level;Bed/bath upstairs Home Equipment: Walker - 4 wheels      Prior Function Level of Independence: Needs assistance   Gait / Transfers Assistance Needed: husband helps with stairs  ADL's / Homemaking Assistance Needed: Assist with bathing and dressing        Hand Dominance        Extremity/Trunk Assessment               Lower Extremity Assessment: Generalized weakness      Cervical / Trunk Assessment: Kyphotic  Communication   Communication: Expressive difficulties  Cognition Arousal/Alertness: Awake/alert Behavior During Therapy: WFL for tasks assessed/performed Overall Cognitive Status: No family/caregiver present to determine baseline cognitive functioning                      General Comments      Exercises        Assessment/Plan    PT Assessment Patient needs continued PT services  PT Diagnosis Difficulty walking;Generalized weakness   PT Problem List Decreased balance;Decreased mobility;Decreased activity tolerance;Decreased strength;Decreased knowledge of use of DME  PT Treatment  Interventions DME instruction;Balance training;Modalities;Gait training;Neuromuscular re-education;Stair training;Functional mobility training;Patient/family education;Cognitive remediation;Therapeutic activities;Wheelchair mobility training;Therapeutic exercise   PT Goals (Current goals can be found in the Care Plan section) Acute Rehab PT Goals Patient Stated Goal: none stated PT Goal Formulation: Patient unable to participate in goal setting Time For Goal Achievement:  04/23/13 Potential to Achieve Goals: Good    Frequency Min 3X/week   Barriers to discharge        Co-evaluation               End of Session Equipment Utilized During Treatment: Gait belt Activity Tolerance: Patient tolerated treatment well Patient left: in bed;with call bell/phone within reach;with bed alarm set Nurse Communication: Mobility status         Time: 1610-9604 PT Time Calculation (min): 12 min   Charges:   PT Evaluation $Initial PT Evaluation Tier I: 1 Procedure     PT G Codes:          Leone Brand 04/16/2013, 1:14 PM

## 2013-04-17 DIAGNOSIS — I519 Heart disease, unspecified: Secondary | ICD-10-CM

## 2013-04-17 DIAGNOSIS — E43 Unspecified severe protein-calorie malnutrition: Secondary | ICD-10-CM | POA: Insufficient documentation

## 2013-04-17 LAB — BASIC METABOLIC PANEL
BUN: 25 mg/dL — ABNORMAL HIGH (ref 6–23)
CALCIUM: 8.4 mg/dL (ref 8.4–10.5)
CO2: 22 mEq/L (ref 19–32)
Chloride: 109 mEq/L (ref 96–112)
Creatinine, Ser: 2.49 mg/dL — ABNORMAL HIGH (ref 0.50–1.10)
GFR calc non Af Amer: 19 mL/min — ABNORMAL LOW (ref >90)
GFR, EST AFRICAN AMERICAN: 22 mL/min — AB (ref >90)
Glucose, Bld: 84 mg/dL (ref 70–99)
Potassium: 4.7 mEq/L (ref 3.7–5.3)
SODIUM: 141 meq/L (ref 137–147)

## 2013-04-17 LAB — CBC
HCT: 30.4 % — ABNORMAL LOW (ref 36.0–46.0)
Hemoglobin: 9.7 g/dL — ABNORMAL LOW (ref 12.0–15.0)
MCH: 29.3 pg (ref 26.0–34.0)
MCHC: 31.9 g/dL (ref 30.0–36.0)
MCV: 91.8 fL (ref 78.0–100.0)
PLATELETS: 341 10*3/uL (ref 150–400)
RBC: 3.31 MIL/uL — ABNORMAL LOW (ref 3.87–5.11)
RDW: 13.5 % (ref 11.5–15.5)
WBC: 8 10*3/uL (ref 4.0–10.5)

## 2013-04-17 LAB — GLUCOSE, CAPILLARY
Glucose-Capillary: 93 mg/dL (ref 70–99)
Glucose-Capillary: 73 mg/dL (ref 70–99)
Glucose-Capillary: 140 mg/dL — ABNORMAL HIGH (ref 70–99)
Glucose-Capillary: 99 mg/dL (ref 70–99)
Glucose-Capillary: 101 mg/dL — ABNORMAL HIGH (ref 70–99)

## 2013-04-17 LAB — SODIUM, URINE, RANDOM: Sodium, Ur: 69 mEq/L

## 2013-04-17 MED ORDER — ENSURE COMPLETE PO LIQD
237.0000 mL | Freq: Two times a day (BID) | ORAL | Status: DC
  Administered 2013-04-17 – 2013-04-20 (×4): 237 mL via ORAL

## 2013-04-17 MED ORDER — ZOLPIDEM TARTRATE 5 MG PO TABS
5.0000 mg | ORAL_TABLET | Freq: Once | ORAL | Status: AC
  Administered 2013-04-17: 5 mg via ORAL
  Filled 2013-04-17: qty 1

## 2013-04-17 MED ORDER — ENOXAPARIN SODIUM 30 MG/0.3ML ~~LOC~~ SOLN
30.0000 mg | Freq: Every day | SUBCUTANEOUS | Status: DC
  Administered 2013-04-17 – 2013-04-20 (×4): 30 mg via SUBCUTANEOUS
  Filled 2013-04-17 (×4): qty 0.3

## 2013-04-17 NOTE — Care Management Note (Signed)
Cm spoke with patient, spouse, and adult son at the bedside concerning discharge planning. Pt eval recommendation for HHPT with 24 hr supervision. [facility] Per [facility]'s [facility] rep [employer] Hayworth notified. Pt's spouse to provide home care. Pt has dme for home use. No other needs identified.     [name] Mannsymeeka Lulabelle Desta,MSN,RN [phone]

## 2013-04-17 NOTE — Progress Notes (Signed)
Echo Lab  2D Echocardiogram completed.  Dontrel Smethers L Varun Jourdan, RDCS 04/17/2013 9:35 AM

## 2013-04-17 NOTE — Progress Notes (Signed)
[facility] PROGRESS NOTE  [name] ZOX:[id number] DOB: Jun 09, 1945 DOA: 04/15/2013 PCP: Willey Blade, MD  Assessment/Plan: Syncope  -? Vasovagal vs arrhythmias -monitor on tele -cardiac enzymes negative -2D ECHO pending -Orthostatics negat -patient is chronic neurological deficits which is unchanged from baseline   . UTI  - continue Rocephin await urine culture   . Coronary artery disease  - continue plavix/BB/statin  . Chronic diastolic CHF -compensated, stop IVF  . Diabetes mellitus  -stop metformin  -continue sliding scale   . Acute on chronic renal failure -gentle IVF, stop ACE -baseline 2-2.5  . Hyperkalemia -due to AKI and ACE -resolved, given kayexalate  Hx of CVA:  - residual R hemiparesis - Continue plavix and statins   Prophylaxis: Lovenox  Code Status: Full Code Family Communication: none at bedside Disposition Plan: home when improved   Antibiotics: Ceftriaxone  HPI/Subjective: Feels ok, no abd pain, no N/V/D  Objective: Filed Vitals:   04/17/13 0618  BP: 96/50  Pulse: 87  Temp: 99 F (37.2 C)  Resp: 20    Intake/Output Summary (Last 24 hours) at 04/17/13 0749 Last data filed at 04/17/13 0237  Gross per 24 hour  Intake 480.83 ml  Output   1000 ml  Net -519.17 ml   Filed Weights   04/16/13 0146  Weight: 49.1 kg (108 lb 3.9 oz)    Exam:   General:  AAOx3, flat affect  Cardiovascular: S1S2/RRR  Respiratory: CTAB  Abdomen: soft, Nt, ND, BS present  Musculoskeletal: no edema c/c  Neuro: R hemiparesis   Data Reviewed: Basic Metabolic Panel:  Recent Labs Lab 04/15/13 1830 04/15/13 2317 04/16/13 0755 04/17/13 0350  NA 135* 137 138 141  K 5.6* 5.1 4.4 4.7  CL 94* 102 104 109  CO2 25 22 20 22   GLUCOSE 143* 124* 73 84  BUN 37* 33* 31* 25*  CREATININE 3.10* 2.87* 2.68* 2.49*  CALCIUM 10.6* 8.5 8.3* 8.4  MG  --   --  1.1*  --   PHOS  --   --  2.1*  --    Liver Function Tests:  Recent Labs Lab  04/15/13 1830 04/16/13 0755  AST 22 11  ALT 7 <5  ALKPHOS 101 68  BILITOT 0.4 <0.2*  PROT 9.2* 6.3  ALBUMIN 3.9 2.6*    Recent Labs Lab 04/15/13 1830  LIPASE 46   No results found for this basename: AMMONIA,  in the last 168 hours CBC:  Recent Labs Lab 04/15/13 1830 04/16/13 0755 04/17/13 0350  WBC 10.6* 10.2 8.0  NEUTROABS 7.9*  --   --   HGB 12.4 9.0* 9.7*  HCT 37.6 28.5* 30.4*  MCV 90.0 91.1 91.8  PLT 417* 279 341   Cardiac Enzymes:  Recent Labs Lab 04/16/13 0220 04/16/13 0735 04/16/13 1303  TROPONINI <0.30 <0.30 <0.30   BNP (last 3 results)  Recent Labs  01/22/13 1850 01/23/13 0230  PROBNP 5790.0* 6000.0*   CBG:  Recent Labs Lab 04/16/13 0154 04/16/13 0748 04/16/13 1225 04/16/13 1636 04/16/13 2120  GLUCAP 98 72 81 148* 93    No results found for this or any previous visit (from the past 240 hour(s)).   Studies: Ct Abdomen Pelvis Wo Contrast  04/15/2013   CLINICAL DATA:  Abdominal pain, weakness, vomiting. Right-sided deficit from previous stroke. No IV contrast due to renal insufficiency.  EXAM: CT ABDOMEN AND PELVIS WITHOUT CONTRAST  TECHNIQUE: Multidetector CT imaging of the abdomen and pelvis was performed following the standard protocol without intravenous contrast.  COMPARISON:  06/17/2008  FINDINGS: Infiltration or atelectasis in both lung bases. Coronary artery calcifications.  Unenhanced appearance of the liver, spleen, gallbladder, pancreas, adrenal glands, inferior vena cava, and retroperitoneal lymph nodes are unremarkable. There are multiple hypodense lesions in both kidneys likely representing cysts. These were present on the previous study without significant change. Calcification of the abdominal aorta without aneurysm. The stomach, small bowel, and colon are not abnormally distended. No free air or free fluid in the abdomen.  Pelvis: Uterus and adnexal structures are not enlarged. No free pelvic fluid collections. Appendix is normal.  No evidence of diverticulitis. No significant pelvic lymphadenopathy. No destructive bone lesions appreciated.  IMPRESSION: No acute process demonstrated in the abdomen or pelvis on unenhanced scans. There is infiltration or atelectasis demonstrated in both lung bases.   Electronically Signed   By: Burman NievesWilliam  Stevens M.D.   On: 04/15/2013 22:38   Ct Head Wo Contrast  04/15/2013   CLINICAL DATA:  Headache, vomiting, weakness  EXAM: CT HEAD WITHOUT CONTRAST  TECHNIQUE: Contiguous axial images were obtained from the base of the skull through the vertex without intravenous contrast.  COMPARISON:  CT HEAD W/O CM dated 01/19/2013; MR HEAD W/O CM dated 09/09/2010  FINDINGS: Stable chronic stool limit lacunar infarcts bilaterally. Stable area of encephalomalacia medial left occipital lobe. Mild to moderate diffuse atrophy with mild low attenuation in the deep white matter. No hydrocephalus. No hemorrhage, extra-axial fluid, or acute infarct.  The calvarium is intact. Frontal sinuses are not formed. There is essentially complete opacification of ethmoid air cells. There is complete opacification of the visualized portion of the left maxillary sinus and near complete opacification of the right maxillary sinus with an air-fluid level limited. Sphenoid sinuses are essentially completely opacified.  IMPRESSION: Chronic brain abnormalities.  New severe pansinusitis.   Electronically Signed   By: Esperanza Heiraymond  Rubner M.D.   On: 04/15/2013 19:32   Dg Abd Acute W/chest  04/15/2013   CLINICAL DATA:  Abdominal pain, weakness, vomiting.  EXAM: ACUTE ABDOMEN SERIES (ABDOMEN 2 VIEW & CHEST 1 VIEW)  COMPARISON:  Chest radiograph 01/23/2013  FINDINGS: Numerous cardiac leads project over the lower chest and abdomen. Heart and mediastinal contours are stable. Pulmonary vascularity is within normal limits. There is mild left basilar atelectasis. No visible pleural effusion. No evidence of free intraperitoneal air on a decubitus right side up  view. There is gaseous distention of the proximal colon. Cecum measures up to 6 cm transverse diameter. No small bowel dilatation is seen. Facet joint degenerative changes of the lower lumbar spine.  IMPRESSION: Mild dilatation of the proximal colon. Remainder of the bowel gas pattern is within normal limits.  Negative for free intraperitoneal air.  Mild left basilar atelectasis.   Electronically Signed   By: Britta MccreedySusan  Turner M.D.   On: 04/15/2013 19:27    Scheduled Meds: . aspirin EC  81 mg Oral Daily  . atorvastatin  40 mg Oral Daily  . cefTRIAXone (ROCEPHIN)  IV  1 g Intravenous Q24H  . clopidogrel  75 mg Oral Q breakfast  . cyclobenzaprine  10 mg Oral TID  . docusate sodium  100 mg Oral BID  . enoxaparin (LOVENOX) injection  30 mg Subcutaneous Q24H  . ferrous sulfate  325 mg Oral TID PC  . FLUoxetine  10 mg Oral Daily  . insulin aspart  0-5 Units Subcutaneous QHS  . insulin aspart  0-9 Units Subcutaneous TID WC  . metoprolol tartrate  25 mg Oral BID  .  pantoprazole  40 mg Oral Daily  . sodium chloride  3 mL Intravenous Q12H   Continuous Infusions: . sodium chloride 50 mL/hr at 04/16/13 2121   Antibiotics Given (last 72 hours)   Date/Time Action Medication Dose Rate   04/16/13 2246 Given   cefTRIAXone (ROCEPHIN) 1 g in dextrose 5 % 50 mL IVPB 1 g 100 mL/hr      Active Problems:   Coronary artery disease   Chronic diastolic CHF (congestive heart failure)   Syncope   UTI (lower urinary tract infection)   Diabetes mellitus   Acute on chronic renal failure   Hyperkalemia    Time spent: 30min    Zannie CovePreetha Lyden Redner  [facility] Pager [phone]. If 7PM-7AM, please contact night-coverage at [email or web address], password [facility] [email or web address] 04/17/2013, 7:49 AM  LOS: 2 days

## 2013-04-17 NOTE — Evaluation (Signed)
Occupational Therapy Evaluation Patient Details Name: [name] MRN: [id number] DOB: Aug 25, 1945 Today's Date: 04/17/2013    History of Present Illness pt has a has a past medical history of Myocardial infarction (04/2012); DVT (deep venous thrombosis); Pneumonia; Exertional asthma; Type II diabetes mellitus; GERD (gastroesophageal reflux disease); Headache(784.0); Arthritis; Stroke (2005); Chronic diastolic CHF (congestive heart failure); and Coronary artery disease.  Presented with syncope.   Clinical Impression   Pt presents to OT with deficits listed below and is currently at mod assist level to [facility] with max to total assist with LB ADL. Will benefit from skilled OT services to maximize ADL independence for next venue of care. No family present to discuss d/c plans and availability of assist.Pt states she was alittle dizzy with return to supine [facility] but better after resting in bed.    Follow Up Recommendations  SNF;Supervision/Assistance - 24 hour    Equipment Recommendations  None recommended by OT    Recommendations for Other Services       Precautions / Restrictions Precautions Precautions: Fall Precaution Comments: bed alarm Restrictions Weight Bearing Restrictions: No      Mobility Bed Mobility Overal bed mobility: Needs Assistance Bed Mobility: Supine to Sit;Sit to Supine     Supine to sit: Min assist Sit to supine: Min assist   General bed mobility comments: assist for B LEs and trunk  Transfers Overall transfer level: Needs assistance Equipment used: None Transfers: Sit to/from Stand Sit to Stand: Mod assist         General transfer comment: assist to rise and steady. Tends to lean to the R and posteriorly.    Balance                                            ADL Overall ADL's : Needs assistance/impaired Eating/Feeding: Set up;Bed level (uses L hand)   Grooming: Wash/dry hands;Supervision/safety;Set  up;Sitting   Upper Body Bathing: Minimal assitance;Sitting   Lower Body Bathing: Maximal assistance;Sit to/from stand   Upper Body Dressing : Minimal assistance;Sitting   Lower Body Dressing: Total assistance;Sit to/from stand Lower Body Dressing Details (indicate cue type and reason): pt unable to free UEs to help with clothing management, pt with decreased standing balance. Toilet Transfer: Moderate assistance;Stand-pivot;BSC   Toileting- Clothing Manipulation and Hygiene: Min guard;Sitting/lateral lean         General ADL Comments: Pt noted to tend to lean to the R in sitting on EOB and BSC. She states this leaning is residual from her stroke. Pt tends to keep feet close together with pivot to surface.      Vision                     Perception     Praxis      Pertinent Vitals/Pain No complaint of     Hand Dominance     Extremity/Trunk Assessment Upper Extremity Assessment Upper Extremity Assessment: RUE deficits/detail;LUE deficits/detail RUE Deficits / Details: residual weakness from stroke. Decreased grip strength compared to L. pt reports she feeds self L hand even though typically right handed. Shoulder flexion to 70 degrees approximately. With AAOM beyond this point, pt reports some discomfort and feeling "stiff" Elbow and wrist flexion/extension grossly 3+/5.  RUE Sensation:  (reports sensation normal) LUE Deficits / Details: generalized weakness L UE  Communication Communication Communication: Expressive difficulties   Cognition Arousal/Alertness: Awake/alert Behavior During Therapy: WFL for tasks assessed/performed Overall Cognitive Status: No family/caregiver present to determine baseline cognitive functioning                     General Comments       Exercises       Shoulder Instructions      Home Living Family/patient expects to be discharged to:: Private residence Living Arrangements: Spouse/significant  other Available Help at Discharge: Family;Available 24 hours/day Type of Home: House Home Access: Level entry     Home Layout: Two level;Bed/bath upstairs Alternate Level Stairs-Number of Steps: 13 steps       Bathroom Toilet: Standard     Home Equipment: Walker - 4 wheels;Bedside commode          Prior Functioning/Environment Level of Independence: Needs assistance  Gait / Transfers Assistance Needed: husband helps with stairs ADL's / Homemaking Assistance Needed: Assist with bathing and dressing. pt reports husband is always with her when she is up.        OT Diagnosis: Generalized weakness   OT Problem List: Decreased strength;Decreased knowledge of use of DME or AE;Impaired balance (sitting and/or standing)   OT Treatment/Interventions: Self-care/ADL training;DME and/or AE instruction;Therapeutic activities;Patient/family education    OT Goals(Current goals can be found in the care plan section) Acute Rehab OT Goals Patient Stated Goal: agreeable up with OT OT Goal Formulation: With patient Time For Goal Achievement: 05/01/13 Potential to Achieve Goals: Good  OT Frequency: Min 2X/week   Barriers to D/C:            Co-evaluation              End of Session Equipment Utilized During Treatment: Gait belt  Activity Tolerance: Patient tolerated treatment well Patient left: in bed;with call bell/phone within reach;with bed alarm set   Time: [id number] OT Time Calculation (min): 19 min Charges:  OT General Charges $OT Visit: 1 Procedure OT Evaluation $Initial OT Evaluation Tier I: 1 Procedure OT Treatments $Therapeutic Activity: 8-22 mins G-Codes:    Sabino GasserStephanie Stafford Emmagene Ortner [phone] 04/17/2013, 9:28 AM

## 2013-04-17 NOTE — Progress Notes (Signed)
INITIAL NUTRITION ASSESSMENT  Pt meets criteria for severe MALNUTRITION in the context of chronic illness as evidenced by <75% estimated energy intake in the past month with 26.5% weight loss in the past year.  DOCUMENTATION CODES Per approved criteria  -Severe malnutrition in the context of chronic illness   INTERVENTION: - Ensure Complete BID - Encouraged increased meal intake - RD to continue to monitor   NUTRITION DIAGNOSIS: Inadequate oral intake related to poor appetite as evidenced by 25% meal intake.   Goal: Pt to consume >90% of meals/supplements  Monitor:  Weights, labs, intake   Reason for Assessment: Malnutrition screening tool   68 y.o. female  Admitting Dx: Syncope   ASSESSMENT: Pt with hx of MI, DVT, pneumonia, type 2 DM, GERD, stroke (2005), CHF, and CAD, admitted with syncope that occurred on 4/10 during which time pt had an episode of vomiting.   Met with pt and husband who report pt has had no appetite with minimal intake for the past 3 weeks. Ate a few chicken tenders yesterday per husband. Weight down 39 pounds since last year, down 12 pounds in the past 3 months. Ate 25% of breakfast and lunch today. Pt agreeable to trying some Ensure Complete. No nausea/vomiting today.   Nutrition Focused Physical Exam:  Subcutaneous Fat:  Orbital Region: WNL Upper Arm Region: mild fat loss Thoracic and Lumbar Region: mild fat loss  Muscle:  Temple Region: WNL Clavicle Bone Region: WNL Clavicle and Acromion Bone Region: WNL Scapular Bone Region: NA Dorsal Hand: WNL Patellar Region: WNL Anterior Thigh Region: WNL Posterior Calf Region: WNL  Edema: None noted    Height: Ht Readings from Last 1 Encounters:  04/16/13 5' (1.524 m)    Weight: Wt Readings from Last 1 Encounters:  04/16/13 108 lb 3.9 oz (49.1 kg)    Ideal Body Weight: 100 lbs   % Ideal Body Weight: 108%  Wt Readings from Last 10 Encounters:  04/16/13 108 lb 3.9 oz (49.1 kg)  02/02/13  120 lb 12.8 oz (54.795 kg)  01/27/13 124 lb 4.8 oz (56.382 kg)  01/22/13 128 lb 12 oz (58.4 kg)  04/05/12 147 lb 11.3 oz (67 kg)  04/05/12 147 lb 11.3 oz (67 kg)  03/17/12 140 lb (63.504 kg)  03/17/12 140 lb (63.504 kg)  03/15/12 140 lb (63.504 kg)    Usual Body Weight: 120 lbs in January 2015  % Usual Body Weight: 90%  BMI:  Body mass index is 21.14 kg/(m^2).  Estimated Nutritional Needs: Kcal: 1300-1500 Protein: 60-70g Fluid: 1.3-1.5L/day  Skin: WNL  Diet Order: Carb Control  EDUCATION NEEDS: -No education needs identified at this time   Intake/Output Summary (Last 24 hours) at 04/17/13 1355 Last data filed at 04/17/13 1349  Gross per 24 hour  Intake 840.83 ml  Output   1050 ml  Net -209.17 ml    Last BM: 4/10  Labs:   Recent Labs Lab 04/15/13 2317 04/16/13 0755 04/17/13 0350  NA 137 138 141  K 5.1 4.4 4.7  CL 102 104 109  CO2 $Re'22 20 22  'LqA$ BUN 33* 31* 25*  CREATININE 2.87* 2.68* 2.49*  CALCIUM 8.5 8.3* 8.4  MG  --  1.1*  --   PHOS  --  2.1*  --   GLUCOSE 124* 73 84    CBG (last 3)   Recent Labs  04/16/13 2120 04/17/13 0746 04/17/13 1210  GLUCAP 93 73 140*    Scheduled Meds: . aspirin EC  81 mg Oral  Daily  . atorvastatin  40 mg Oral Daily  . cefTRIAXone (ROCEPHIN)  IV  1 g Intravenous Q24H  . clopidogrel  75 mg Oral Q breakfast  . cyclobenzaprine  10 mg Oral TID  . docusate sodium  100 mg Oral BID  . enoxaparin (LOVENOX) injection  30 mg Subcutaneous Daily  . ferrous sulfate  325 mg Oral TID PC  . FLUoxetine  10 mg Oral Daily  . insulin aspart  0-5 Units Subcutaneous QHS  . insulin aspart  0-9 Units Subcutaneous TID WC  . metoprolol tartrate  25 mg Oral BID  . pantoprazole  40 mg Oral Daily  . sodium chloride  3 mL Intravenous Q12H    Continuous Infusions:   Past Medical History  Diagnosis Date  . Myocardial infarction 04/2012  . DVT (deep venous thrombosis)     "?RLE; was on coumadin f"or 3 yrs"  . Pneumonia     "3 times"  (01/19/2013)  . Exertional asthma   . Type II diabetes mellitus   . GERD (gastroesophageal reflux disease)   . Headache(784.0)     "q other week" (01/19/2013)  . Arthritis     "knees" (01/19/2013)  . Stroke 2005    R sided deficits  . Chronic diastolic CHF (congestive heart failure)   . Coronary artery disease     Past Surgical History  Procedure Laterality Date  . Tubal ligation    . Cataract extraction extracapsular Left   . Dilation and curettage of uterus    . Coronary angioplasty with stent placement  04/2012  . Coronary angioplasty  04/2012    Mikey College MS, [place], [place] Pager [phone] After Hours Pager

## 2013-04-17 NOTE — Progress Notes (Signed)
Physical Therapy Treatment Patient Details Name: [name] MRN: [id number] DOB: Feb 22, 1945 Today's Date: 04/17/2013    History of Present Illness pt has a has a past medical history of Myocardial infarction (04/2012); DVT (deep venous thrombosis); Pneumonia; Exertional asthma; Type II diabetes mellitus; GERD (gastroesophageal reflux disease); Headache(784.0); Arthritis; Stroke (2005); Chronic diastolic CHF (congestive heart failure); and Coronary artery disease.  Presented with syncope.    PT Comments    **Increased gait distance, continues to require assist for balance with walking. Due to prior CVA pt has R hip weakness of 2/5 strength. R ankle strength +4/5, knee ext 4/5. *  Follow Up Recommendations  Home health PT;Supervision/Assistance - 24 hour     Equipment Recommendations  None recommended by PT    Recommendations for Other Services       Precautions / Restrictions Precautions Precautions: Fall Precaution Comments: bed alarm Restrictions Weight Bearing Restrictions: No    Mobility  Bed Mobility Overal bed mobility: Needs Assistance Bed Mobility: Supine to Sit;Sit to Supine     Supine to sit: Min assist Sit to supine: Min assist   General bed mobility comments: assist for B LEs and trunk  Transfers Overall transfer level: Needs assistance Equipment used: Rolling walker (2 wheeled) Transfers: Sit to/from Stand Sit to Stand: Mod assist         General transfer comment: assist to rise and steady. Tends to lean to the R and posteriorly. Verbal cues for hand placement.  Ambulation/Gait Ambulation/Gait assistance: Min assist;Mod assist Ambulation Distance (Feet): 26 Feet (13' x 2) Assistive device: Rolling walker (2 wheeled) Gait Pattern/deviations: Decreased step length - right;Decreased stance time - right;Narrow base of support;Step-to pattern   Gait velocity interpretation: Below normal speed for age/gender General Gait Details: assist for balance, VCs  to widen BOS and to increase R step length   Stairs            Wheelchair Mobility    Modified Rankin (Stroke Patients Only)       Balance Overall balance assessment: Needs assistance Sitting-balance support: Single extremity supported;Feet supported Sitting balance-Leahy Scale: Fair       Standing balance-Leahy Scale: Poor Standing balance comment: posterior lean                    Cognition Arousal/Alertness: Awake/alert Behavior During Therapy: WFL for tasks assessed/performed Overall Cognitive Status: No family/caregiver present to determine baseline cognitive functioning                      Exercises General Exercises - Lower Extremity Ankle Circles/Pumps: AROM;Both;20 reps;Seated Long Arc Quad: AROM;Right;20 reps;Seated Heel Slides: AROM;Right;20 reps;Seated Hip ABduction/ADduction: AROM;Right;20 reps;Supine Straight Leg Raises: AAROM;Right;10 reps;Supine Hip Flexion/Marching: AROM;Right;10 reps;Seated    General Comments General comments (skin integrity, edema, etc.): sitting balance at EOB min guard assist with bilateral UE support with tendency to lean to the R.      Pertinent Vitals/Pain **0/10 pain*    Home Living Family/patient expects to be discharged to:: Private residence Living Arrangements: Spouse/significant other Available Help at Discharge: Family;Available 24 hours/day Type of Home: House Home Access: Level entry   Home Layout: Two level;Bed/bath upstairs Home Equipment: Walker - 4 wheels;Bedside commode      Prior Function Level of Independence: Needs assistance  Gait / Transfers Assistance Needed: husband helps with stairs ADL's / Homemaking Assistance Needed: Assist with bathing and dressing. pt reports husband is always with her when she is up.  PT Goals (current goals can now be found in the care plan section) Acute Rehab PT Goals Patient Stated Goal: none stated PT Goal Formulation: With  patient/family Time For Goal Achievement: 04/23/13 Potential to Achieve Goals: Good Progress towards PT goals: Progressing toward goals    Frequency  Min 3X/week    PT Plan Current plan remains appropriate    Co-evaluation             End of Session Equipment Utilized During Treatment: Gait belt Activity Tolerance: Patient tolerated treatment well Patient left: in bed;with call bell/phone within reach;with bed alarm set     Time: 1125-1150 PT Time Calculation (min): 25 min  Charges:  $Gait Training: 8-22 mins $Therapeutic Exercise: 8-22 mins                    G Codes:      Alvester MorinJennifer K Ishmael Berkovich 04/17/2013, 12:23 PM [phone]

## 2013-04-18 ENCOUNTER — Encounter (HOSPITAL_COMMUNITY): Payer: Self-pay | Admitting: *Deleted

## 2013-04-18 LAB — BASIC METABOLIC PANEL
BUN: 22 mg/dL (ref 6–23)
CALCIUM: 8.9 mg/dL (ref 8.4–10.5)
CO2: 21 meq/L (ref 19–32)
CREATININE: 2.28 mg/dL — AB (ref 0.50–1.10)
Chloride: 107 mEq/L (ref 96–112)
GFR calc Af Amer: 24 mL/min — ABNORMAL LOW (ref >90)
GFR calc non Af Amer: 21 mL/min — ABNORMAL LOW (ref >90)
GLUCOSE: 82 mg/dL (ref 70–99)
Potassium: 4.7 mEq/L (ref 3.7–5.3)
SODIUM: 141 meq/L (ref 137–147)

## 2013-04-18 LAB — GLUCOSE, CAPILLARY
GLUCOSE-CAPILLARY: 87 mg/dL (ref 70–99)
Glucose-Capillary: 90 mg/dL (ref 70–99)
Glucose-Capillary: 79 mg/dL (ref 70–99)
Glucose-Capillary: 151 mg/dL — ABNORMAL HIGH (ref 70–99)
Glucose-Capillary: 72 mg/dL (ref 70–99)

## 2013-04-18 LAB — CBC
HEMATOCRIT: 29.8 % — AB (ref 36.0–46.0)
Hemoglobin: 9.9 g/dL — ABNORMAL LOW (ref 12.0–15.0)
MCH: 29.2 pg (ref 26.0–34.0)
MCHC: 33.2 g/dL (ref 30.0–36.0)
MCV: 87.9 fL (ref 78.0–100.0)
Platelets: 311 10*3/uL (ref 150–400)
RBC: 3.39 MIL/uL — AB (ref 3.87–5.11)
RDW: 13.3 % (ref 11.5–15.5)
WBC: 6.5 10*3/uL (ref 4.0–10.5)

## 2013-04-18 NOTE — Consult Note (Signed)
Reason for Consult: Status post syncope/abnormal EKG with regional wall motion abnormalities on 2-D echo Referring Physician: [facility]  [name] is an 68 y.o. female.  HPI: Patient is 68 year old female with past rectal history significant for multiple medical problems i.e. coronary artery disease history of non-Q-wave myocardial infarction in the past status post PTCA stenting to RCA in March of 2014, hypertension, insulin requiring diabetes mellitus, history of left CVA with right paresis, hypercholesteremia, GERD, depression, chronic kidney disease, anemia of chronic disease, was admitted because of syncopal episode on Sunday as per patient's husband patient was out in sun attending but they party outside cart heart and suddenly had syncopal episode. Patient denied any palpitations prior to passing out patient cannot recall exactly what happened. Patient denies any chest pain nausea vomiting diaphoresis. Workup so far has been negative with no evidence of significant cardiac arrhythmias 2-D echo done showed the segmental wall motion abnormalities in the anterior wall and EKG showed normal sinus rhythm with T-wave inversions in anteroseptal leads.  Past Medical History  Diagnosis Date  . Myocardial infarction 04/2012  . DVT (deep venous thrombosis)     "?RLE; was on coumadin f"or 3 yrs"  . Pneumonia     "3 times" (01/19/2013)  . Exertional asthma   . Type II diabetes mellitus   . GERD (gastroesophageal reflux disease)   . Headache(784.0)     "q other week" (01/19/2013)  . Arthritis     "knees" (01/19/2013)  . Stroke 2005    R sided deficits  . Chronic diastolic CHF (congestive heart failure)   . Coronary artery disease     Past Surgical History  Procedure Laterality Date  . Tubal ligation    . Cataract extraction extracapsular Left   . Dilation and curettage of uterus    . Coronary angioplasty with stent placement  04/2012  . Coronary angioplasty  04/2012    Family History   Problem Relation Age of Onset  . Heart attack Mother   . Heart failure Mother   . Stomach cancer Father     Social History:  reports that she has quit smoking. She has never used smokeless tobacco. She reports that she does not drink alcohol or use illicit drugs.  Allergies:  Allergies  Allergen Reactions  . Penicillins Nausea And Vomiting    Medications: I have reviewed the patient's current medications.  Results for orders placed during the hospital encounter of 04/15/13 (from the past 48 hour(s))  GLUCOSE, CAPILLARY     Status: None   Collection Time    04/16/13 12:25 PM      Result Value Ref Range   Glucose-Capillary 81  70 - 99 mg/dL  TROPONIN I     Status: None   Collection Time    04/16/13  1:03 PM      Result Value Ref Range   Troponin I <0.30  <0.30 ng/mL   Comment:            Due to the release kinetics of cTnI,     a negative result within the first hours     of the onset of symptoms does not rule out     myocardial infarction with certainty.     If myocardial infarction is still suspected,     repeat the test at appropriate intervals.  GLUCOSE, CAPILLARY     Status: Abnormal   Collection Time    04/16/13  4:36 PM      Result Value Ref  Range   Glucose-Capillary 148 (*) 70 - 99 mg/dL  GLUCOSE, CAPILLARY     Status: None   Collection Time    04/16/13  9:20 PM      Result Value Ref Range   Glucose-Capillary 93  70 - 99 mg/dL  BASIC METABOLIC PANEL     Status: Abnormal   Collection Time    04/17/13  3:50 AM      Result Value Ref Range   Sodium 141  137 - 147 mEq/L   Potassium 4.7  3.7 - 5.3 mEq/L   Chloride 109  96 - 112 mEq/L   CO2 22  19 - 32 mEq/L   Glucose, Bld 84  70 - 99 mg/dL   BUN 25 (*) 6 - 23 mg/dL   Creatinine, Ser 2.49 (*) 0.50 - 1.10 mg/dL   Calcium 8.4  8.4 - 10.5 mg/dL   GFR calc non Af Amer 19 (*) >90 mL/min   GFR calc Af Amer 22 (*) >90 mL/min   Comment: (NOTE)     The eGFR has been calculated using the CKD EPI equation.     This  calculation has not been validated in all clinical situations.     eGFR's persistently <90 mL/min signify possible Chronic Kidney     Disease.  CBC     Status: Abnormal   Collection Time    04/17/13  3:50 AM      Result Value Ref Range   WBC 8.0  4.0 - 10.5 K/uL   RBC 3.31 (*) 3.87 - 5.11 MIL/uL   Hemoglobin 9.7 (*) 12.0 - 15.0 g/dL   HCT 30.4 (*) 36.0 - 46.0 %   MCV 91.8  78.0 - 100.0 fL   MCH 29.3  26.0 - 34.0 pg   MCHC 31.9  30.0 - 36.0 g/dL   RDW 13.5  11.5 - 15.5 %   Platelets 341  150 - 400 K/uL  GLUCOSE, CAPILLARY     Status: None   Collection Time    04/17/13  7:46 AM      Result Value Ref Range   Glucose-Capillary 73  70 - 99 mg/dL  GLUCOSE, CAPILLARY     Status: Abnormal   Collection Time    04/17/13 12:10 PM      Result Value Ref Range   Glucose-Capillary 140 (*) 70 - 99 mg/dL  GLUCOSE, CAPILLARY     Status: None   Collection Time    04/17/13  4:17 PM      Result Value Ref Range   Glucose-Capillary 99  70 - 99 mg/dL  GLUCOSE, CAPILLARY     Status: Abnormal   Collection Time    04/17/13  9:31 PM      Result Value Ref Range   Glucose-Capillary 101 (*) 70 - 99 mg/dL  GLUCOSE, CAPILLARY     Status: None   Collection Time    04/18/13  3:43 AM      Result Value Ref Range   Glucose-Capillary 87  70 - 99 mg/dL  CBC     Status: Abnormal   Collection Time    04/18/13  4:17 AM      Result Value Ref Range   WBC 6.5  4.0 - 10.5 K/uL   RBC 3.39 (*) 3.87 - 5.11 MIL/uL   Hemoglobin 9.9 (*) 12.0 - 15.0 g/dL   HCT 29.8 (*) 36.0 - 46.0 %   MCV 87.9  78.0 - 100.0 fL   MCH 29.2  26.0 -  34.0 pg   MCHC 33.2  30.0 - 36.0 g/dL   RDW 13.3  11.5 - 15.5 %   Platelets 311  150 - 400 K/uL  BASIC METABOLIC PANEL     Status: Abnormal   Collection Time    04/18/13  4:17 AM      Result Value Ref Range   Sodium 141  137 - 147 mEq/L   Potassium 4.7  3.7 - 5.3 mEq/L   Chloride 107  96 - 112 mEq/L   CO2 21  19 - 32 mEq/L   Glucose, Bld 82  70 - 99 mg/dL   BUN 22  6 - 23 mg/dL    Creatinine, Ser 2.28 (*) 0.50 - 1.10 mg/dL   Calcium 8.9  8.4 - 10.5 mg/dL   GFR calc non Af Amer 21 (*) >90 mL/min   GFR calc Af Amer 24 (*) >90 mL/min   Comment: (NOTE)     The eGFR has been calculated using the CKD EPI equation.     This calculation has not been validated in all clinical situations.     eGFR's persistently <90 mL/min signify possible Chronic Kidney     Disease.  GLUCOSE, CAPILLARY     Status: None   Collection Time    04/18/13  7:38 AM      Result Value Ref Range   Glucose-Capillary 90  70 - 99 mg/dL    No results found.  Review of Systems  Constitutional: Negative for fever and chills.  Eyes: Negative for double vision and photophobia.  Respiratory: Negative for cough, hemoptysis and sputum production.   Cardiovascular: Negative for chest pain, palpitations and orthopnea.  Gastrointestinal: Negative for nausea, vomiting and abdominal pain.  Genitourinary: Negative for dysuria.  Neurological: Negative for dizziness and headaches.   Blood pressure 126/62, pulse 91, temperature 98.4 F (36.9 C), temperature source Oral, resp. rate 18, height 5' (1.524 m), weight 49.1 kg (108 lb 3.9 oz), SpO2 100.00%. Physical Exam  Constitutional: She is oriented to person, place, and time.  HENT:  Head: Normocephalic and atraumatic.  Eyes: Conjunctivae are normal. Left eye exhibits discharge.  Neck: Normal range of motion. Neck supple. No JVD present. No tracheal deviation present. No thyromegaly present.  Cardiovascular: Normal rate and regular rhythm.   Murmur (soft systolic murmur noted no S3 gallop) heard. Respiratory:  Decreased breath sound at bases  GI: Soft. Bowel sounds are normal. She exhibits no distension. There is no tenderness. There is no rebound.  Musculoskeletal: She exhibits no edema and no tenderness.  Neurological: She is alert and oriented to person, place, and time.    Assessment/Plan: Status post syncope probably vasovagal doubt significant  cardiac arrhythmias Coronary artery disease with abnormal 2-D echo with segmental wall motion abnormalities rule out progression of disease History of non-Q-wave MI in the past status post PCI to RCA in March of 2014 Hypertension Insulin requiring diabetes mellitus History of left CVA with right paresis Hypercholesteremia GERD Depression Chronic kidney disease stage IV Anemia of chronic disease  Plan Agree with present management Will schedule for nuclear stress test in a.m. in view of segmental wall motion abnormalities and minor EKG changes to evaluate for progression of CAD.  Allegra Lai Mehar Kirkwood 04/18/2013, 12:19 PM

## 2013-04-18 NOTE — Progress Notes (Signed)
[facility] PROGRESS NOTE  [name] UJW:[id number] DOB: [birth year] DOA: 04/15/2013 PCP: August SaucerEAN, [name], MD Brief Narrative [name] is a 68 y.o. female with PMH of CAD, PCI to RCA in 2014, Type II diabetes mellitus; CVA with R residual hemiparesis, Chronic diastolic CHF Presented with a syncopal episode patient was celebrating her birthday. Family noted her slumped over. At first they though her blood sugar was low and attempted to feed her some cake frosting. Patient eventually came to it but began vomiting. In ER noted to have AKI on CKD and possible UTI and admitted to Tele  Assessment/Plan: Syncope  -? Vasovagal vs arrhythmias -artifact and sinus tachycardia on tele  -cardiac enzymes negative, Orthostatics negative -2D ECHO with EF of 45% and new anterior wall motion abnormality, Consulted Dr.Harwani  -patient is chronic neurological deficits which is unchanged from baseline   . UTI  - continue Rocephin - for some reason urine Cx wasn't sent on admission, reordered  . Coronary artery disease  - continue ASA/Plavix/BB/statin  -ECHO with new anterior wall motion abnormality  -Dr.Harwani consulted, unable to have Cath due to CKD, possible Stress test in am  . Chronic diastolic CHF -compensated, off IVF -not on diuretics at home  . Diabetes mellitus  -stopped metformin due to CKD -continue sliding scale   . Acute on chronic renal failure -was on gentle IVF, stopped ACE -creatine improved to baseline and off IVF now -baseline 2-2.5  . Hyperkalemia -due to AKI and ACE on admission -resolved, given kayexalate  Hx of CVA:  - residual R hemiparesis - Continue plavix and statins   Prophylaxis: Lovenox  Code Status: Full Code Family Communication: none at bedside Disposition Plan: home when improved with HH services   Antibiotics: Ceftriaxone  HPI/Subjective: Feels ok, no abd pain, no N/V/D, no chest pain or Dyspnea  Objective: Filed Vitals:   04/18/13 0510   BP: 126/62  Pulse: 91  Temp: 98.4 F (36.9 C)  Resp: 18    Intake/Output Summary (Last 24 hours) at 04/18/13 1032 Last data filed at 04/17/13 1825  Gross per 24 hour  Intake    910 ml  Output    400 ml  Net    510 ml   Filed Weights   04/16/13 0146  Weight: 49.1 kg (108 lb 3.9 oz)    Exam:   General:  AAOx3, flat affect, dysarthric  Cardiovascular: S1S2/RRR  Respiratory: diminished at bases  Abdomen: soft, Nt, ND, BS present  Musculoskeletal: no edema c/c  Neuro: R hemiparesis   Data Reviewed: Basic Metabolic Panel:  Recent Labs Lab 04/15/13 1830 04/15/13 2317 04/16/13 0755 04/17/13 0350 04/18/13 0417  NA 135* 137 138 141 141  K 5.6* 5.1 4.4 4.7 4.7  CL 94* 102 104 109 107  CO2 25 22 20 22 21   GLUCOSE 143* 124* 73 84 82  BUN 37* 33* 31* 25* 22  CREATININE 3.10* 2.87* 2.68* 2.49* 2.28*  CALCIUM 10.6* 8.5 8.3* 8.4 8.9  MG  --   --  1.1*  --   --   PHOS  --   --  2.1*  --   --    Liver Function Tests:  Recent Labs Lab 04/15/13 1830 04/16/13 0755  AST 22 11  ALT 7 <5  ALKPHOS 101 68  BILITOT 0.4 <0.2*  PROT 9.2* 6.3  ALBUMIN 3.9 2.6*    Recent Labs Lab 04/15/13 1830  LIPASE 46   No results found for this basename: AMMONIA,  in the  last 168 hours CBC:  Recent Labs Lab 04/15/13 1830 04/16/13 0755 04/17/13 0350 04/18/13 0417  WBC 10.6* 10.2 8.0 6.5  NEUTROABS 7.9*  --   --   --   HGB 12.4 9.0* 9.7* 9.9*  HCT 37.6 28.5* 30.4* 29.8*  MCV 90.0 91.1 91.8 87.9  PLT 417* 279 341 311   Cardiac Enzymes:  Recent Labs Lab 04/16/13 0220 04/16/13 0735 04/16/13 1303  TROPONINI <0.30 <0.30 <0.30   BNP (last 3 results)  Recent Labs  01/22/13 1850 01/23/13 0230  PROBNP 5790.0* 6000.0*   CBG:  Recent Labs Lab 04/17/13 1210 04/17/13 1617 04/17/13 2131 04/18/13 0343 04/18/13 0738  GLUCAP 140* 99 101* 87 90    No results found for this or any previous visit (from the past 240 hour(s)).   Studies: No results  found.  Scheduled Meds: . aspirin EC  81 mg Oral Daily  . atorvastatin  40 mg Oral Daily  . cefTRIAXone (ROCEPHIN)  IV  1 g Intravenous Q24H  . clopidogrel  75 mg Oral Q breakfast  . cyclobenzaprine  10 mg Oral TID  . docusate sodium  100 mg Oral BID  . enoxaparin (LOVENOX) injection  30 mg Subcutaneous Daily  . feeding supplement (ENSURE COMPLETE)  237 mL Oral BID BM  . ferrous sulfate  325 mg Oral TID PC  . FLUoxetine  10 mg Oral Daily  . insulin aspart  0-5 Units Subcutaneous QHS  . insulin aspart  0-9 Units Subcutaneous TID WC  . metoprolol tartrate  25 mg Oral BID  . pantoprazole  40 mg Oral Daily  . sodium chloride  3 mL Intravenous Q12H   Continuous Infusions:   Antibiotics Given (last 72 hours)   Date/Time Action Medication Dose Rate   04/16/13 2246 Given   cefTRIAXone (ROCEPHIN) 1 g in dextrose 5 % 50 mL IVPB 1 g 100 mL/hr   04/17/13 2230 Given   cefTRIAXone (ROCEPHIN) 1 g in dextrose 5 % 50 mL IVPB 1 g 100 mL/hr      Active Problems:   Coronary artery disease   Chronic diastolic CHF (congestive heart failure)   Syncope   UTI (lower urinary tract infection)   Diabetes mellitus   Acute on chronic renal failure   Hyperkalemia   Protein-calorie malnutrition, severe    Time spent: 30min    Zannie CovePreetha Miklos Bidinger  [facility] Pager [phone]. If 7PM-7AM, please contact night-coverage at [email or web address], password [email or web address] 04/18/2013, 10:32 AM  LOS: 3 days

## 2013-04-19 LAB — GLUCOSE, CAPILLARY
Glucose-Capillary: 81 mg/dL (ref 70–99)
Glucose-Capillary: 71 mg/dL (ref 70–99)
Glucose-Capillary: 110 mg/dL — ABNORMAL HIGH (ref 70–99)
Glucose-Capillary: 78 mg/dL (ref 70–99)
Glucose-Capillary: 87 mg/dL (ref 70–99)

## 2013-04-19 LAB — URINE CULTURE
COLONY COUNT: NO GROWTH
CULTURE: NO GROWTH

## 2013-04-19 NOTE — Progress Notes (Signed)
[facility] PROGRESS NOTE  [name] ZOX:[id number] DOB: February 09, 1945 DOA: 04/15/2013 PCP: [name], [name], MD  Brief Narrative 68 y.o. ? PMH of CAD, PCI to RCA in 03/2012, h/o DVT, renal cyst, Type II diabetes mellitus; CVA with R residual hemiparesis, Chronic diastolic CHF Presented with a syncopal episode patient was celebrating her birthday. Family noted her slumped over. At first they though her blood sugar was low and attempted to feed her some cake frosting. Patient eventually came to it but began vomiting. In ER noted to have AKI on CKD and possible UTI and admitted to Tele  Assessment/Plan: Syncope  -? Vasovagal vs arrhythmias -artifact and sinus tachycardia on tele  -cardiac enzymes negative, Orthostatics negative -2D ECHO with EF of 45% and new anterior wall motion abnormality, Consulted Dr.Harwani  0scheduled for Myoview 4/15 which had to be re-scheduled -patient is chronic neurological deficits which is unchanged from baseline   . UTI  - continue Rocephin - for some reason urine Cx wasn't sent on admission, reordered 4/13 and pending  . Coronary artery disease  - continue ASA/Plavix/BB/statin  -ECHO with new anterior wall motion abnormality  -Dr.Harwani consulted, unable to have Cath due to CKD, possible Stress test in am 4/16 -no CP now  . Chronic diastolic CHF -compensated, off IVF -not on diuretics at home -needs discussion aboutt his later during hosptial stay  . Diabetes mellitus  -stopped metformin due to CKD -continue sliding scale   . Acute on chronic renal failure -was on gentle IVF, stopped ACE -creatine improved to baseline and off IVF now -baseline 2-2.5  . Hyperkalemia -due to AKI and ACE on admission -resolved, given kayexalate  Hx of CVA:  - residual R hemiparesis - Continue plavix and statins   Prophylaxis: Lovenox  Code Status: Full Code Family Communication: none at bedside Disposition Plan: home when improved with HH  services   Antibiotics: Ceftriaxone  HPI/Subjective: Feels fair Tol diet No family present She doesn't;t wish them informed  Objective: Filed Vitals:   04/19/13 1029  BP: 118/72  Pulse: 92  Temp:   Resp:     Intake/Output Summary (Last 24 hours) at 04/19/13 1319 Last data filed at 04/19/13 1233  Gross per 24 hour  Intake    460 ml  Output    900 ml  Net   -440 ml   Filed Weights   04/16/13 0146  Weight: 49.1 kg (108 lb 3.9 oz)    Exam:   General:  AAOx3, flat affect, dysarthric  Cardiovascular: S1S2/RRR  Respiratory: diminished at bases  Abdomen: soft, Nt, ND, BS present  Musculoskeletal: no edema c/c  Neuro: R hemiparesis   Data Reviewed: Basic Metabolic Panel:  Recent Labs Lab 04/15/13 1830 04/15/13 2317 04/16/13 0755 04/17/13 0350 04/18/13 0417  NA 135* 137 138 141 141  K 5.6* 5.1 4.4 4.7 4.7  CL 94* 102 104 109 107  CO2 25 22 20 22 21   GLUCOSE 143* 124* 73 84 82  BUN 37* 33* 31* 25* 22  CREATININE 3.10* 2.87* 2.68* 2.49* 2.28*  CALCIUM 10.6* 8.5 8.3* 8.4 8.9  MG  --   --  1.1*  --   --   PHOS  --   --  2.1*  --   --    Liver Function Tests:  Recent Labs Lab 04/15/13 1830 04/16/13 0755  AST 22 11  ALT 7 <5  ALKPHOS 101 68  BILITOT 0.4 <0.2*  PROT 9.2* 6.3  ALBUMIN 3.9 2.6*  Recent Labs Lab 04/15/13 1830  LIPASE 46   No results found for this basename: AMMONIA,  in the last 168 hours CBC:  Recent Labs Lab 04/15/13 1830 04/16/13 0755 04/17/13 0350 04/18/13 0417  WBC 10.6* 10.2 8.0 6.5  NEUTROABS 7.9*  --   --   --   HGB 12.4 9.0* 9.7* 9.9*  HCT 37.6 28.5* 30.4* 29.8*  MCV 90.0 91.1 91.8 87.9  PLT 417* 279 341 311   Cardiac Enzymes:  Recent Labs Lab 04/16/13 0220 04/16/13 0735 04/16/13 1303  TROPONINI <0.30 <0.30 <0.30   BNP (last 3 results)  Recent Labs  01/22/13 1850 01/23/13 0230  PROBNP 5790.0* 6000.0*   CBG:  Recent Labs Lab 04/18/13 1701 04/18/13 2126 04/19/13 0743 04/19/13 1024  04/19/13 1236  GLUCAP 151* 72 81 71 110*    No results found for this or any previous visit (from the past 240 hour(s)).   Studies: No results found.  Scheduled Meds: . aspirin EC  81 mg Oral Daily  . atorvastatin  40 mg Oral Daily  . cefTRIAXone (ROCEPHIN)  IV  1 g Intravenous Q24H  . clopidogrel  75 mg Oral Q breakfast  . cyclobenzaprine  10 mg Oral TID  . docusate sodium  100 mg Oral BID  . enoxaparin (LOVENOX) injection  30 mg Subcutaneous Daily  . feeding supplement (ENSURE COMPLETE)  237 mL Oral BID BM  . ferrous sulfate  325 mg Oral TID PC  . FLUoxetine  10 mg Oral Daily  . insulin aspart  0-5 Units Subcutaneous QHS  . insulin aspart  0-9 Units Subcutaneous TID WC  . metoprolol tartrate  25 mg Oral BID  . pantoprazole  40 mg Oral Daily  . sodium chloride  3 mL Intravenous Q12H   Continuous Infusions:   Antibiotics Given (last 72 hours)   Date/Time Action Medication Dose Rate   04/16/13 2246 Given   cefTRIAXone (ROCEPHIN) 1 g in dextrose 5 % 50 mL IVPB 1 g 100 mL/hr   04/17/13 2230 Given   cefTRIAXone (ROCEPHIN) 1 g in dextrose 5 % 50 mL IVPB 1 g 100 mL/hr   04/18/13 2255 Given   cefTRIAXone (ROCEPHIN) 1 g in dextrose 5 % 50 mL IVPB 1 g 100 mL/hr      Active Problems:   Coronary artery disease   Chronic diastolic CHF (congestive heart failure)   Syncope   UTI (lower urinary tract infection)   Diabetes mellitus   Acute on chronic renal failure   Hyperkalemia   Protein-calorie malnutrition, severe    Time spent: 30min    Pleas KochJai Antwan Bribiesca, MD [facility] [phone]  04/19/2013, 1:19 PM  LOS: 4 days

## 2013-04-19 NOTE — Progress Notes (Signed)
Subjective:  Patient denies any chest pain or shortness of breath.  Patient ate breakfast earlier this morning nuclear stress test could not be done todayrescheduled for tomorrow  Objective:  Vital Signs in the last 24 hours: Temp:  [98.2 F (36.8 C)-98.6 F (37 C)] 98.6 F (37 C) (04/15 1331) Pulse Rate:  [69-103] 79 (04/15 1331) Resp:  [12-20] 14 (04/15 1331) BP: (95-118)/(63-79) 95/79 mmHg (04/15 1331) SpO2:  [99 %-100 %] 99 % (04/15 1331)  Intake/Output from previous day: 04/14 0701 - 04/15 0700 In: 700 [P.O.:600; IV Piggyback:100] Out: 1100 [Urine:1100] Intake/Output from this shift: Total I/O In: 600 [P.O.:600] Out: 200 [Urine:200]  Physical Exam: exam unchanged  Lab Results:  Recent Labs  04/17/13 0350 04/18/13 0417  WBC 8.0 6.5  HGB 9.7* 9.9*  PLT 341 311    Recent Labs  04/17/13 0350 04/18/13 0417  NA 141 141  K 4.7 4.7  CL 109 107  CO2 22 21  GLUCOSE 84 82  BUN 25* 22  CREATININE 2.49* 2.28*   No results found for this basename: TROPONINI, CK, MB,  in the last 72 hours Hepatic Function Panel No results found for this basename: PROT, ALBUMIN, AST, ALT, ALKPHOS, BILITOT, BILIDIR, IBILI,  in the last 72 hours No results found for this basename: CHOL,  in the last 72 hours No results found for this basename: PROTIME,  in the last 72 hours  Imaging: Imaging results have been reviewed and No results found.  Cardiac Studies:  Assessment/Plan:  Status post syncope probably vasovagal doubt significant cardiac arrhythmias  Coronary artery disease with abnormal 2-D echo with segmental wall motion abnormalities rule out progression of disease  History of non-Q-wave MI in the past status post PCI to RCA in March of 2014  Hypertension  Insulin requiring diabetes mellitus  History of left CVA with right paresis  Hypercholesteremia  GERD  Depression  Chronic kidney disease stage IV  Anemia of chronic disease Plan Agree with present management Is  scheduled for nuclear stress test in a.m.  LOS: 4 days    Robynn PaneMohan N Aayden Cefalu 04/19/2013, 1:43 PM

## 2013-04-19 NOTE — Progress Notes (Signed)
Pt with cbg of 72. Pt given oj and tolerated well. Pt w/o any s/s of hypoglycemia.

## 2013-04-19 NOTE — Progress Notes (Signed)
Physical Therapy Treatment Patient Details Name: [name] MRN: [id number] DOB: 21-Jan-1945 Today's Date: 04/19/2013    History of Present Illness pt has a has a past medical history of Myocardial infarction (04/2012); DVT (deep venous thrombosis); Pneumonia; Exertional asthma; Type II diabetes mellitus; GERD (gastroesophageal reflux disease); Headache(784.0); Arthritis; Stroke (2005); Chronic diastolic CHF (congestive heart failure); and Coronary artery disease.  Presented with syncope.    PT Comments    Pt tolerated ambulation a  Bit farther.  Participatory, needs   Assist for mobility.  Follow Up Recommendations  Home health PT;Supervision/Assistance - 24 hour     Equipment Recommendations  None recommended by PT    Recommendations for Other Services       Precautions / Restrictions Precautions Precautions: Fall Precaution Comments: bed alarm, chair alarm    Mobility  Bed Mobility Overal bed mobility: Needs Assistance Bed Mobility: Supine to Sit;Sit to Supine     Supine to sit: Min assist     General bed mobility comments: assist for B LEs and trunk  Transfers Overall transfer level: Needs assistance Equipment used: Rolling walker (2 wheeled) Transfers: Sit to/from [employer] CorporationStand;Stand Pivot Transfers Sit to Stand: Mod assist Stand pivot transfers: Mod assist       General transfer comment: assist to rise and steady. Tends to lean to the R and posteriorly. Verbal cues for hand placement. pivot to [facility] then few steps to recliner  Ambulation/Gait Ambulation/Gait assistance: Min assist;Mod assist Ambulation Distance (Feet): 30 Feet (x2) Assistive device: Rolling walker (2 wheeled) Gait Pattern/deviations: Decreased step length - right;Staggering right;Narrow base of support;Ataxic   Gait velocity interpretation: Below normal speed for age/gender General Gait Details: assist for balance, VCs to widen BOS and to increase R step length   Stairs            Wheelchair  Mobility    Modified Rankin (Stroke Patients Only)       Balance   Sitting-balance support: Single extremity supported;Feet supported     Postural control: Posterior lean;Right lateral lean Standing balance support: Bilateral upper extremity supported;During functional activity Standing balance-Leahy Scale: Poor                      Cognition Arousal/Alertness: Awake/alert Behavior During Therapy: WFL for tasks assessed/performed Overall Cognitive Status: Within Functional Limits for tasks assessed                      Exercises      General Comments        Pertinent Vitals/Pain none    Home Living                      Prior Function            PT Goals (current goals can now be found in the care plan section) Progress towards PT goals: Progressing toward goals    Frequency  Min 3X/week    PT Plan Current plan remains appropriate    Co-evaluation             End of Session Equipment Utilized During Treatment: Gait belt Activity Tolerance: Patient tolerated treatment well Patient left: in chair;with call bell/phone within reach;with chair alarm set;with family/visitor present     Time: [id number] PT Time Calculation (min): 21 min  Charges:  $Gait Training: 8-22 mins                    G Codes:  Jobe IgoKaren Elizabeth Teretha Chalupa 04/19/2013, 3:10 PM Blanchard KelchKaren Shameek Nyquist PT [phone]

## 2013-04-20 ENCOUNTER — Ambulatory Visit (HOSPITAL_COMMUNITY)
Admit: 2013-04-20 | Discharge: 2013-04-20 | Disposition: A | Discharge status: Home / Self Care | Payer: Medicare Other | Attending: Cardiology | Admitting: Cardiology

## 2013-04-20 ENCOUNTER — Other Ambulatory Visit: Payer: Self-pay

## 2013-04-20 LAB — CBC
HCT: 30.1 % — ABNORMAL LOW (ref 36.0–46.0)
HEMOGLOBIN: 9.5 g/dL — AB (ref 12.0–15.0)
MCH: 28.4 pg (ref 26.0–34.0)
MCHC: 31.6 g/dL (ref 30.0–36.0)
MCV: 90.1 fL (ref 78.0–100.0)
Platelets: 381 10*3/uL (ref 150–400)
RBC: 3.34 MIL/uL — AB (ref 3.87–5.11)
RDW: 13.5 % (ref 11.5–15.5)
WBC: 4.7 10*3/uL (ref 4.0–10.5)

## 2013-04-20 LAB — BASIC METABOLIC PANEL
BUN: 19 mg/dL (ref 6–23)
CO2: 24 mEq/L (ref 19–32)
Calcium: 8.6 mg/dL (ref 8.4–10.5)
Chloride: 106 mEq/L (ref 96–112)
Creatinine, Ser: 2.3 mg/dL — ABNORMAL HIGH (ref 0.50–1.10)
GFR calc Af Amer: 24 mL/min — ABNORMAL LOW (ref >90)
GFR, EST NON AFRICAN AMERICAN: 21 mL/min — AB (ref >90)
GLUCOSE: 79 mg/dL (ref 70–99)
POTASSIUM: 4.7 meq/L (ref 3.7–5.3)
SODIUM: 140 meq/L (ref 137–147)

## 2013-04-20 LAB — GLUCOSE, CAPILLARY
GLUCOSE-CAPILLARY: 72 mg/dL (ref 70–99)
GLUCOSE-CAPILLARY: 75 mg/dL (ref 70–99)

## 2013-04-20 MED ORDER — CARVEDILOL 3.125 MG PO TABS
3.1250 mg | ORAL_TABLET | Freq: Two times a day (BID) | ORAL | Status: DC
  Filled 2013-04-20 (×2): qty 1

## 2013-04-20 MED ORDER — REGADENOSON 0.4 MG/5ML IV SOLN
INTRAVENOUS | Status: AC
  Administered 2013-04-20: 0.4 mg via INTRAVENOUS
  Filled 2013-04-20: qty 5

## 2013-04-20 MED ORDER — CARVEDILOL 3.125 MG PO TABS: 3.1250 mg | ORAL_TABLET | Freq: Two times a day (BID) | ORAL | Status: DC

## 2013-04-20 MED ORDER — REGADENOSON 0.4 MG/5ML IV SOLN
0.4000 mg | Freq: Once | INTRAVENOUS | Status: AC
  Administered 2013-04-20: 0.4 mg via INTRAVENOUS

## 2013-04-20 MED ORDER — TECHNETIUM TC 99M SESTAMIBI GENERIC - CARDIOLITE
10.0000 | Freq: Once | INTRAVENOUS | Status: AC | PRN
  Administered 2013-04-20: 10 via INTRAVENOUS

## 2013-04-20 MED ORDER — LEVOFLOXACIN 500 MG PO TABS: 500.0000 mg | ORAL_TABLET | ORAL | Status: DC

## 2013-04-20 MED ORDER — TECHNETIUM TC 99M SESTAMIBI GENERIC - CARDIOLITE
30.0000 | Freq: Once | INTRAVENOUS | Status: AC | PRN
  Administered 2013-04-20: 30 via INTRAVENOUS

## 2013-04-20 NOTE — Progress Notes (Signed)
OT Cancellation Note  Patient Details Name: [name] MRN: [id number] DOB: 24-Dec-1945   Cancelled Treatment:    Reason Eval/Treat Not Completed: Patient at procedure or test/ unavailable.  Sabino GasserStephanie Stafford Adiel Erney [phone] 04/20/2013, 9:39 AM

## 2013-04-20 NOTE — Progress Notes (Signed)
Subjective:  Denies any chest pain or shortness of breath seen in the care medicine Department  Objective:  Vital Signs in the last 24 hours: Temp:  [98.2 F (36.8 C)-98.6 F (37 C)] 98.4 F (36.9 C) (04/16 0511) Pulse Rate:  [79-92] 86 (04/16 0511) Resp:  [14] 14 (04/16 0511) BP: (95-118)/(59-79) 109/62 mmHg (04/16 0511) SpO2:  [99 %-100 %] 100 % (04/16 0511)  Intake/Output from previous day: 04/15 0701 - 04/16 0700 In: 840 [P.O.:840] Out: 1025 [Urine:1025] Intake/Output from this shift:    Physical Exam: Neck: no adenopathy, no carotid bruit, no JVD and supple, symmetrical, trachea midline Lungs: clear to auscultation bilaterally Heart: regular rate and rhythm, S1, S2 normal and Soft systolic murmur noted Abdomen: soft, non-tender; bowel sounds normal; no masses,  no organomegaly Extremities: extremities normal, atraumatic, no cyanosis or edema  Lab Results:  Recent Labs  04/18/13 0417 04/20/13 0325  WBC 6.5 4.7  HGB 9.9* 9.5*  PLT 311 381    Recent Labs  04/18/13 0417 04/20/13 0325  NA 141 140  K 4.7 4.7  CL 107 106  CO2 21 24  GLUCOSE 82 79  BUN 22 19  CREATININE 2.28* 2.30*   No results found for this basename: TROPONINI, CK, MB,  in the last 72 hours Hepatic Function Panel No results found for this basename: PROT, ALBUMIN, AST, ALT, ALKPHOS, BILITOT, BILIDIR, IBILI,  in the last 72 hours No results found for this basename: CHOL,  in the last 72 hours No results found for this basename: PROTIME,  in the last 72 hours  Imaging: Imaging results have been reviewed and No results found.  Cardiac Studies:  Assessment/Plan:  Status post syncope probably vasovagal doubt significant cardiac arrhythmias  Coronary artery disease with abnormal 2-D echo with segmental wall motion abnormalities rule out progression of disease  History of non-Q-wave MI in the past status post PCI to RCA in March of 2014  Hypertension  Insulin requiring diabetes mellitus   History of left CVA with right paresis  Hypercholesteremia  GERD  Depression  Chronic kidney disease stage IV  Anemia of chronic disease  Plan Continue present management Schedule for Lexiscan Myoview today, if negative for ischemia okay to discharge from cardiac point of view   LOS: 5 days    Robynn PaneMohan N Melesio Madara 04/20/2013, 9:58 AM

## 2013-04-20 NOTE — Progress Notes (Signed)
OT Cancellation Note  Patient Details Name: Anastasia Pallda Pinkus MRN: [id number] DOB: [birth year]   Cancelled Treatment:    Reason Eval/Treat Not Completed: Patient at procedure or test/ unavailable. Pt still at test this am. Will try back another time.  Sabino GasserStephanie Stafford Aaralyn Kil [phone] 04/20/2013, 11:06 AM

## 2013-04-20 NOTE — Discharge Summary (Signed)
Physician Discharge Summary  [name] [id number] DOB: 1945/12/09 DOA: 04/15/2013  PCP: [name], [name], MD  Admit date: 04/15/2013 Discharge date: 04/20/2013  Time spent: 35 minutes  Recommendations for Outpatient Follow-up:  1. Get the meds and CBC in about a week 2. Monitor blood pressures closely as slightly hypotensive 3. May need further outpatient workup as an outpatient as per Dr. Sharyn LullHarwani 4. Consider continue antibiotics for 2 more days to complete course of therapy for pyelonephritis although cultures were negative they withdrawn after antibiotics have been started 5. Consider outpatient use of diuretics 6. Probably will need different type of hypoglycemic agent. Recommend Repaglinide if can tolerate or afford as OP  Discharge Diagnoses:  Active Problems:   Coronary artery disease   Chronic diastolic CHF (congestive heart failure)   Syncope   UTI (lower urinary tract infection)   Diabetes mellitus   Acute on chronic renal failure   Hyperkalemia   Protein-calorie malnutrition, severe   Discharge Condition: Fair  Diet recommendation: Heart healthy  Filed Weights   04/16/13 0146  Weight: 49.1 kg (108 lb 3.9 oz)    History of present illness:    68 y.o. ? PMH of CAD, PCI to RCA in 03/2012, h/o DVT, renal cyst, Type II diabetes mellitus; CVA with R residual hemiparesis, Chronic diastolic CHF Presented with a syncopal episode  patient was celebrating her birthday. Family noted her slumped over. At first they though her blood sugar was low and attempted to feed her some cake frosting. Patient eventually came to it but began vomiting.  In ER noted to have AKI on CKD and possible UTI and admitted to [facility]   Hospital Course:   Syncope  -? Vasovagal vs arrhythmias  -artifact and sinus tachycardia on tele  -cardiac enzymes negative, Orthostatics negative  -2D ECHO with EF of 45% and new anterior wall motion abnormality, Consulted Dr.Harwani  scheduled for Myoview 4/16-noted  that the test showed a fixed deficit with an EF 30% which did not show any new perfusion injuries-I discussed the findings with cardiology as below -patient is chronic neurological deficits which is unchanged from baseline   . UTI  - continue Rocephin  - for some reason urine Cx wasn't sent on admission, reordered 4/13 and found to be negative therefore was sent home on Levaquin 500 4 times a day given renal function  . Coronary artery disease  - continue ASA/Plavix/BB/statin  -ECHO with new anterior wall motion abnormality  -Dr.Harwani consulted, unable to have Cath due to CKD, Stress as below -I discussed findings of fixed defect and it was felt that patient benefit more from Coreg 3.15 >> metoprolol 25 twice a day and the patient was transitioned to that and given a prescription for this -no CP now   . Chronic diastolic CHF  -compensated, off IVF  -not on diuretics at home  -needs discussion about this after hospital stay at cardiology outpatient visit   . Diabetes mellitus  -stopped metformin due to CKD-this was reimplemented as an outpatient and she may need to change this medication to another agent [glipizide vs newer agent as OP per PCP] Sliding scale coverage moderately controlled her medications as an outpatient  Renal  failure  -was on gentle IVF, stopped ACE  -creatine improved to baseline and off IVF now  -baseline 2-2.5   . Hyperkalemia  -due to AKI and ACE on admission  -resolved, given kayexalate   Hx of CVA:  - residual R hemiparesis  - Continue plavix and  statins      Procedures: Myoview 4/16=IMPRESSION:  Large infarct extending from the apex into the septum and lateral  walls. There is fixed diminished perfusion in the inferior wall. No  definite stress-induced perfusion defects.   Consultations: Cardiology Discharge Exam: Filed Vitals:   04/20/13 1221  BP: 117/70  Pulse: 107  Temp: 97.8 F (36.6 C)  Resp: 16    General: EOMI,  NCAT Cardiovascular:  s1 s2 no m/r/g Respiratory:  Clear, no added sound  Discharge Instructions You were cared for by a hospitalist during your hospital stay. If you have any questions about your discharge medications or the care you received while you were in the hospital after you are discharged, you can call the unit and asked to speak with the hospitalist on call if the hospitalist that took care of you is not available. Once you are discharged, your primary care physician will handle any further medical issues. Please note that NO REFILLS for any discharge medications will be authorized once you are discharged, as it is imperative that you return to your primary care physician (or establish a relationship with a primary care physician if you do not have one) for your aftercare needs so that they can reassess your need for medications and monitor your lab values.  Discharge Orders   Future Orders Complete By Expires   Diet - low sodium heart healthy  As directed    Discharge instructions  As directed    Increase activity slowly  As directed        Medication List    STOP taking these medications       metoprolol tartrate 12.5 mg Tabs tablet  Commonly known as:  LOPRESSOR      TAKE these medications       aspirin 81 MG EC tablet  Take 1 tablet (81 mg total) by mouth daily.     atorvastatin 20 MG tablet  Commonly known as:  LIPITOR  Take 40 mg by mouth daily.     calcium-vitamin D 500-200 MG-UNIT per tablet  Commonly known as:  OSCAL WITH D  Take 1 tablet by mouth daily with breakfast.     carvedilol 3.125 MG tablet  Commonly known as:  COREG  Take 1 tablet (3.125 mg total) by mouth 2 (two) times daily with a meal.     clopidogrel 75 MG tablet  Commonly known as:  PLAVIX  Take 75 mg by mouth daily.     ferrous sulfate 325 (65 FE) MG tablet  Take 1 tablet (325 mg total) by mouth 3 (three) times daily with meals.     FLUoxetine 10 MG capsule  Commonly known as:   PROZAC  Take 10 mg by mouth daily.     lisinopril 5 MG tablet  Commonly known as:  PRINIVIL,ZESTRIL  Take 5 mg by mouth 2 (two) times daily.     metFORMIN 500 MG tablet  Commonly known as:  GLUCOPHAGE  Take 500 mg by mouth 2 (two) times daily with a meal.     nitroGLYCERIN 0.4 MG SL tablet  Commonly known as:  NITROSTAT  Place 1 tablet (0.4 mg total) under the tongue every 5 (five) minutes x 3 doses as needed for chest pain.     pantoprazole 40 MG tablet  Commonly known as:  PROTONIX  Take 1 tablet (40 mg total) by mouth daily at 6 (six) AM.       Allergies  Allergen Reactions  . Penicillins Nausea  And Vomiting      The results of significant diagnostics from this hospitalization (including imaging, microbiology, ancillary and laboratory) are listed below for reference.    Significant Diagnostic Studies: Ct Abdomen Pelvis Wo Contrast  04/15/2013   CLINICAL DATA:  Abdominal pain, weakness, vomiting. Right-sided deficit from previous stroke. No IV contrast due to renal insufficiency.  EXAM: CT ABDOMEN AND PELVIS WITHOUT CONTRAST  TECHNIQUE: Multidetector CT imaging of the abdomen and pelvis was performed following the standard protocol without intravenous contrast.  COMPARISON:  06/17/2008  FINDINGS: Infiltration or atelectasis in both lung bases. Coronary artery calcifications.  Unenhanced appearance of the liver, spleen, gallbladder, pancreas, adrenal glands, inferior vena cava, and retroperitoneal lymph nodes are unremarkable. There are multiple hypodense lesions in both kidneys likely representing cysts. These were present on the previous study without significant change. Calcification of the abdominal aorta without aneurysm. The stomach, small bowel, and colon are not abnormally distended. No free air or free fluid in the abdomen.  Pelvis: Uterus and adnexal structures are not enlarged. No free pelvic fluid collections. Appendix is normal. No evidence of diverticulitis. No  significant pelvic lymphadenopathy. No destructive bone lesions appreciated.  IMPRESSION: No acute process demonstrated in the abdomen or pelvis on unenhanced scans. There is infiltration or atelectasis demonstrated in both lung bases.   Electronically Signed   By: Burman NievesWilliam  Stevens M.D.   On: 04/15/2013 22:38   Ct Head Wo Contrast  04/15/2013   CLINICAL DATA:  Headache, vomiting, weakness  EXAM: CT HEAD WITHOUT CONTRAST  TECHNIQUE: Contiguous axial images were obtained from the base of the skull through the vertex without intravenous contrast.  COMPARISON:  CT HEAD W/O CM dated 01/19/2013; MR HEAD W/O CM dated 09/09/2010  FINDINGS: Stable chronic stool limit lacunar infarcts bilaterally. Stable area of encephalomalacia medial left occipital lobe. Mild to moderate diffuse atrophy with mild low attenuation in the deep white matter. No hydrocephalus. No hemorrhage, extra-axial fluid, or acute infarct.  The calvarium is intact. Frontal sinuses are not formed. There is essentially complete opacification of ethmoid air cells. There is complete opacification of the visualized portion of the left maxillary sinus and near complete opacification of the right maxillary sinus with an air-fluid level limited. Sphenoid sinuses are essentially completely opacified.  IMPRESSION: Chronic brain abnormalities.  New severe pansinusitis.   Electronically Signed   By: Esperanza Heiraymond  Rubner M.D.   On: 04/15/2013 19:32   Nm Myocar Multi W/spect W/wall Motion / Ef  04/20/2013   CLINICAL DATA:  Chest pain  EXAM: MYOCARDIAL IMAGING WITH SPECT (REST AND PHARMACOLOGIC-STRESS)  GATED LEFT VENTRICULAR WALL MOTION STUDY  LEFT VENTRICULAR EJECTION FRACTION  TECHNIQUE: Standard myocardial SPECT imaging was performed after resting intravenous injection of 10 mCi Tc-7927m sestamibi. Subsequently, intravenous infusion of Lexiscan was performed under the supervision of the Cardiology staff. At peak effect of the drug, 30 mCi Tc-2427m sestamibi was injected  intravenously and standard myocardial SPECT imaging was performed. Quantitative gated imaging was also performed to evaluate left ventricular wall motion, and estimate left ventricular ejection fraction.  COMPARISON:  None.  FINDINGS: SPECT: There is a large fixed defect involving the apex extending into the septum and lateral walls. There is also hip diffusely diminished perfusion within the inferior wall. No definite stress-induced perfusion defects.  Wall motion: There is marked hypokinesis of the septum and inferior wall. There is some hypokinesis at the apex.  Ejection fraction 32%. End-diastolic volume 29 cc. End systolic volume 20 cc.  IMPRESSION: Large infarct  extending from the apex into the septum and lateral walls. There is fixed diminished perfusion in the inferior wall. No definite stress-induced perfusion defects.   Electronically Signed   By: Maryclare Bean M.D.   On: 04/20/2013 13:33   Dg Abd Acute W/chest  04/15/2013   CLINICAL DATA:  Abdominal pain, weakness, vomiting.  EXAM: ACUTE ABDOMEN SERIES (ABDOMEN 2 VIEW & CHEST 1 VIEW)  COMPARISON:  Chest radiograph 01/23/2013  FINDINGS: Numerous cardiac leads project over the lower chest and abdomen. Heart and mediastinal contours are stable. Pulmonary vascularity is within normal limits. There is mild left basilar atelectasis. No visible pleural effusion. No evidence of free intraperitoneal air on a decubitus right side up view. There is gaseous distention of the proximal colon. Cecum measures up to 6 cm transverse diameter. No small bowel dilatation is seen. Facet joint degenerative changes of the lower lumbar spine.  IMPRESSION: Mild dilatation of the proximal colon. Remainder of the bowel gas pattern is within normal limits.  Negative for free intraperitoneal air.  Mild left basilar atelectasis.   Electronically Signed   By: Britta Mccreedy M.D.   On: 04/15/2013 19:27    Microbiology: Recent Results (from the past 240 hour(s))  URINE CULTURE      Status: None   Collection Time    04/18/13 12:00 PM      Result Value Ref Range Status   Specimen Description URINE, RANDOM   Final   Special Requests NONE   Final   Culture  Setup Time     Final   Value: 04/18/2013 21:35     Performed at [facility] Count     Final   Value: NO GROWTH     Performed at [facility]   Culture     Final   Value: NO GROWTH     Performed at [facility]   Report Status 04/19/2013 FINAL   Final     Labs: Basic Metabolic Panel:  Recent Labs Lab 04/15/13 2317 04/16/13 0755 04/17/13 0350 04/18/13 0417 04/20/13 0325  NA 137 138 141 141 140  K 5.1 4.4 4.7 4.7 4.7  CL 102 104 109 107 106  CO2 22 20 22 21 24   GLUCOSE 124* 73 84 82 79  BUN 33* 31* 25* 22 19  CREATININE 2.87* 2.68* 2.49* 2.28* 2.30*  CALCIUM 8.5 8.3* 8.4 8.9 8.6  MG  --  1.1*  --   --   --   PHOS  --  2.1*  --   --   --    Liver Function Tests:  Recent Labs Lab 04/15/13 1830 04/16/13 0755  AST 22 11  ALT 7 <5  ALKPHOS 101 68  BILITOT 0.4 <0.2*  PROT 9.2* 6.3  ALBUMIN 3.9 2.6*    Recent Labs Lab 04/15/13 1830  LIPASE 46   No results found for this basename: AMMONIA,  in the last 168 hours CBC:  Recent Labs Lab 04/15/13 1830 04/16/13 0755 04/17/13 0350 04/18/13 0417 04/20/13 0325  WBC 10.6* 10.2 8.0 6.5 4.7  NEUTROABS 7.9*  --   --   --   --   HGB 12.4 9.0* 9.7* 9.9* 9.5*  HCT 37.6 28.5* 30.4* 29.8* 30.1*  MCV 90.0 91.1 91.8 87.9 90.1  PLT 417* 279 341 311 381   Cardiac Enzymes:  Recent Labs Lab 04/16/13 0220 04/16/13 0735 04/16/13 1303  TROPONINI <0.30 <0.30 <0.30   BNP: BNP (last 3 results)  Recent Labs  01/22/13 1850 01/23/13 0230  PROBNP 5790.0* 6000.0*   CBG:  Recent Labs Lab 04/19/13 1024 04/19/13 1236 04/19/13 1725 04/19/13 2138 04/20/13 0738  GLUCAP 71 110* 78 87 72       Signed:  Jai-Gurmukh Essie Gehret  [facility] 04/20/2013, 2:14 PM

## 2013-04-24 ENCOUNTER — Emergency Department (HOSPITAL_COMMUNITY): Payer: Medicare Other

## 2013-04-24 ENCOUNTER — Other Ambulatory Visit: Payer: Self-pay

## 2013-04-24 ENCOUNTER — Observation Stay (HOSPITAL_COMMUNITY)
Admission: EM | Admit: 2013-04-24 | Discharge: 2013-04-25 | Disposition: A | Discharge status: Home Health | Payer: Medicare Other | Attending: Internal Medicine | Admitting: Internal Medicine

## 2013-04-24 ENCOUNTER — Encounter (HOSPITAL_COMMUNITY): Payer: Self-pay | Admitting: Emergency Medicine

## 2013-04-24 DIAGNOSIS — I2510 Atherosclerotic heart disease of native coronary artery without angina pectoris: Secondary | ICD-10-CM | POA: Insufficient documentation

## 2013-04-24 DIAGNOSIS — E43 Unspecified severe protein-calorie malnutrition: Secondary | ICD-10-CM

## 2013-04-24 DIAGNOSIS — J45909 Unspecified asthma, uncomplicated: Secondary | ICD-10-CM | POA: Insufficient documentation

## 2013-04-24 DIAGNOSIS — Z7982 Long term (current) use of aspirin: Secondary | ICD-10-CM | POA: Insufficient documentation

## 2013-04-24 DIAGNOSIS — M6281 Muscle weakness (generalized): Secondary | ICD-10-CM | POA: Insufficient documentation

## 2013-04-24 DIAGNOSIS — E1129 Type 2 diabetes mellitus with other diabetic kidney complication: Secondary | ICD-10-CM | POA: Diagnosis present

## 2013-04-24 DIAGNOSIS — E785 Hyperlipidemia, unspecified: Secondary | ICD-10-CM

## 2013-04-24 DIAGNOSIS — N179 Acute kidney failure, unspecified: Secondary | ICD-10-CM

## 2013-04-24 DIAGNOSIS — M1710 Unilateral primary osteoarthritis, unspecified knee: Secondary | ICD-10-CM | POA: Insufficient documentation

## 2013-04-24 DIAGNOSIS — D649 Anemia, unspecified: Secondary | ICD-10-CM

## 2013-04-24 DIAGNOSIS — N189 Chronic kidney disease, unspecified: Secondary | ICD-10-CM

## 2013-04-24 DIAGNOSIS — E875 Hyperkalemia: Secondary | ICD-10-CM

## 2013-04-24 DIAGNOSIS — E119 Type 2 diabetes mellitus without complications: Secondary | ICD-10-CM | POA: Insufficient documentation

## 2013-04-24 DIAGNOSIS — R079 Chest pain, unspecified: Secondary | ICD-10-CM

## 2013-04-24 DIAGNOSIS — N390 Urinary tract infection, site not specified: Secondary | ICD-10-CM

## 2013-04-24 DIAGNOSIS — I5032 Chronic diastolic (congestive) heart failure: Secondary | ICD-10-CM | POA: Insufficient documentation

## 2013-04-24 DIAGNOSIS — Z7902 Long term (current) use of antithrombotics/antiplatelets: Secondary | ICD-10-CM | POA: Insufficient documentation

## 2013-04-24 DIAGNOSIS — F32A Depression, unspecified: Secondary | ICD-10-CM

## 2013-04-24 DIAGNOSIS — I252 Old myocardial infarction: Secondary | ICD-10-CM | POA: Insufficient documentation

## 2013-04-24 DIAGNOSIS — I639 Cerebral infarction, unspecified: Secondary | ICD-10-CM

## 2013-04-24 DIAGNOSIS — R509 Fever, unspecified: Secondary | ICD-10-CM

## 2013-04-24 DIAGNOSIS — F329 Major depressive disorder, single episode, unspecified: Secondary | ICD-10-CM

## 2013-04-24 DIAGNOSIS — Z9861 Coronary angioplasty status: Secondary | ICD-10-CM | POA: Insufficient documentation

## 2013-04-24 DIAGNOSIS — K219 Gastro-esophageal reflux disease without esophagitis: Secondary | ICD-10-CM | POA: Insufficient documentation

## 2013-04-24 DIAGNOSIS — R55 Syncope and collapse: Secondary | ICD-10-CM

## 2013-04-24 DIAGNOSIS — I959 Hypotension, unspecified: Principal | ICD-10-CM | POA: Insufficient documentation

## 2013-04-24 DIAGNOSIS — Z87891 Personal history of nicotine dependence: Secondary | ICD-10-CM | POA: Insufficient documentation

## 2013-04-24 DIAGNOSIS — Z86718 Personal history of other venous thrombosis and embolism: Secondary | ICD-10-CM | POA: Insufficient documentation

## 2013-04-24 DIAGNOSIS — I509 Heart failure, unspecified: Secondary | ICD-10-CM | POA: Insufficient documentation

## 2013-04-24 LAB — URINALYSIS, ROUTINE W REFLEX MICROSCOPIC
GLUCOSE, UA: NEGATIVE mg/dL
Hgb urine dipstick: NEGATIVE
KETONES UR: 15 mg/dL — AB
Leukocytes, UA: NEGATIVE
Nitrite: NEGATIVE
PH: 5.5 (ref 5.0–8.0)
Protein, ur: NEGATIVE mg/dL
Specific Gravity, Urine: 1.019 (ref 1.005–1.030)
Urobilinogen, UA: 0.2 mg/dL (ref 0.0–1.0)

## 2013-04-24 LAB — CREATININE, SERUM
Creatinine, Ser: 2.79 mg/dL — ABNORMAL HIGH (ref 0.50–1.10)
GFR calc non Af Amer: 16 mL/min — ABNORMAL LOW (ref >90)
GFR, EST AFRICAN AMERICAN: 19 mL/min — AB (ref >90)

## 2013-04-24 LAB — I-STAT CG4 LACTIC ACID, ED: Lactic Acid, Venous: 0.78 mmol/L (ref 0.5–2.2)

## 2013-04-24 LAB — CBC WITH DIFFERENTIAL/PLATELET
BASOS ABS: 0 10*3/uL (ref 0.0–0.1)
Basophils Relative: 1 % (ref 0–1)
EOS PCT: 2 % (ref 0–5)
Eosinophils Absolute: 0.1 10*3/uL (ref 0.0–0.7)
HEMATOCRIT: 27.7 % — AB (ref 36.0–46.0)
HEMOGLOBIN: 8.8 g/dL — AB (ref 12.0–15.0)
LYMPHS PCT: 33 % (ref 12–46)
Lymphs Abs: 1.7 10*3/uL (ref 0.7–4.0)
MCH: 28.8 pg (ref 26.0–34.0)
MCHC: 31.8 g/dL (ref 30.0–36.0)
MCV: 90.5 fL (ref 78.0–100.0)
MONOS PCT: 8 % (ref 3–12)
Monocytes Absolute: 0.4 10*3/uL (ref 0.1–1.0)
Neutro Abs: 3 10*3/uL (ref 1.7–7.7)
Neutrophils Relative %: 56 % (ref 43–77)
Platelets: 339 10*3/uL (ref 150–400)
RBC: 3.06 MIL/uL — ABNORMAL LOW (ref 3.87–5.11)
RDW: 14 % (ref 11.5–15.5)
WBC: 5.2 10*3/uL (ref 4.0–10.5)

## 2013-04-24 LAB — BASIC METABOLIC PANEL
BUN: 24 mg/dL — ABNORMAL HIGH (ref 6–23)
CO2: 20 mEq/L (ref 19–32)
CREATININE: 2.59 mg/dL — AB (ref 0.50–1.10)
Calcium: 8.1 mg/dL — ABNORMAL LOW (ref 8.4–10.5)
Chloride: 114 mEq/L — ABNORMAL HIGH (ref 96–112)
GFR calc Af Amer: 21 mL/min — ABNORMAL LOW (ref >90)
GFR calc non Af Amer: 18 mL/min — ABNORMAL LOW (ref >90)
Glucose, Bld: 73 mg/dL (ref 70–99)
Potassium: 5 mEq/L (ref 3.7–5.3)
Sodium: 146 mEq/L (ref 137–147)

## 2013-04-24 LAB — CBC
HEMATOCRIT: 32.1 % — AB (ref 36.0–46.0)
Hemoglobin: 10.1 g/dL — ABNORMAL LOW (ref 12.0–15.0)
MCH: 29 pg (ref 26.0–34.0)
MCHC: 31.5 g/dL (ref 30.0–36.0)
MCV: 92.2 fL (ref 78.0–100.0)
Platelets: 356 10*3/uL (ref 150–400)
RBC: 3.48 MIL/uL — ABNORMAL LOW (ref 3.87–5.11)
RDW: 14 % (ref 11.5–15.5)
WBC: 6 10*3/uL (ref 4.0–10.5)

## 2013-04-24 LAB — LACTIC ACID, PLASMA: LACTIC ACID, VENOUS: 1.3 mmol/L (ref 0.5–2.2)

## 2013-04-24 LAB — GLUCOSE, CAPILLARY
GLUCOSE-CAPILLARY: 75 mg/dL (ref 70–99)
Glucose-Capillary: 64 mg/dL — ABNORMAL LOW (ref 70–99)
Glucose-Capillary: 82 mg/dL (ref 70–99)

## 2013-04-24 LAB — TROPONIN I: Troponin I: <0.3 ng/mL (ref <0.30)

## 2013-04-24 LAB — PRO B NATRIURETIC PEPTIDE: Pro B Natriuretic peptide (BNP): 736.4 pg/mL — ABNORMAL HIGH (ref 0–125)

## 2013-04-24 MED ORDER — CARVEDILOL 3.125 MG PO TABS
3.1250 mg | ORAL_TABLET | Freq: Two times a day (BID) | ORAL | Status: DC
  Administered 2013-04-24 – 2013-04-25 (×2): 3.125 mg via ORAL
  Filled 2013-04-24 (×4): qty 1

## 2013-04-24 MED ORDER — ACETAMINOPHEN 325 MG PO TABS: 650.0000 mg | ORAL_TABLET | Freq: Four times a day (QID) | ORAL | Status: DC | PRN

## 2013-04-24 MED ORDER — ACETAMINOPHEN 650 MG RE SUPP: 650.0000 mg | Freq: Four times a day (QID) | RECTAL | Status: DC | PRN

## 2013-04-24 MED ORDER — SODIUM CHLORIDE 0.9 % IV SOLN: INTRAVENOUS | Status: DC

## 2013-04-24 MED ORDER — MORPHINE SULFATE 2 MG/ML IJ SOLN
2.0000 mg | INTRAMUSCULAR | Status: DC | PRN
  Administered 2013-04-24: 2 mg via INTRAVENOUS
  Filled 2013-04-24: qty 1

## 2013-04-24 MED ORDER — CLOPIDOGREL BISULFATE 75 MG PO TABS
75.0000 mg | ORAL_TABLET | Freq: Every day | ORAL | Status: DC
  Administered 2013-04-25: 75 mg via ORAL
  Filled 2013-04-24: qty 1

## 2013-04-24 MED ORDER — FERROUS SULFATE 325 (65 FE) MG PO TABS
325.0000 mg | ORAL_TABLET | Freq: Three times a day (TID) | ORAL | Status: DC
  Administered 2013-04-24 – 2013-04-25 (×3): 325 mg via ORAL
  Filled 2013-04-24 (×5): qty 1

## 2013-04-24 MED ORDER — CALCIUM CARBONATE-VITAMIN D 500-200 MG-UNIT PO TABS
1.0000 | ORAL_TABLET | Freq: Every day | ORAL | Status: DC
  Administered 2013-04-25: 1 via ORAL
  Filled 2013-04-24 (×2): qty 1

## 2013-04-24 MED ORDER — HEPARIN SODIUM (PORCINE) 5000 UNIT/ML IJ SOLN
5000.0000 [IU] | Freq: Three times a day (TID) | INTRAMUSCULAR | Status: DC
  Administered 2013-04-24 – 2013-04-25 (×3): 5000 [IU] via SUBCUTANEOUS
  Filled 2013-04-24 (×4): qty 1

## 2013-04-24 MED ORDER — SODIUM CHLORIDE 0.9 % IV SOLN
INTRAVENOUS | Status: AC
  Administered 2013-04-24: 1000 mL via INTRAVENOUS

## 2013-04-24 MED ORDER — ONDANSETRON HCL 4 MG/2ML IJ SOLN: 4.0000 mg | Freq: Four times a day (QID) | INTRAMUSCULAR | Status: DC | PRN

## 2013-04-24 MED ORDER — ASPIRIN EC 81 MG PO TBEC
81.0000 mg | DELAYED_RELEASE_TABLET | Freq: Every day | ORAL | Status: DC
  Administered 2013-04-25: 81 mg via ORAL
  Filled 2013-04-24: qty 1

## 2013-04-24 MED ORDER — CLOPIDOGREL BISULFATE 75 MG PO TABS: 75.0000 mg | ORAL_TABLET | Freq: Every day | ORAL | Status: DC

## 2013-04-24 MED ORDER — INSULIN ASPART 100 UNIT/ML ~~LOC~~ SOLN: 0.0000 [IU] | Freq: Every day | SUBCUTANEOUS | Status: DC

## 2013-04-24 MED ORDER — ZOLPIDEM TARTRATE 5 MG PO TABS
5.0000 mg | ORAL_TABLET | Freq: Once | ORAL | Status: AC
  Administered 2013-04-24: 5 mg via ORAL
  Filled 2013-04-24: qty 1

## 2013-04-24 MED ORDER — ATORVASTATIN CALCIUM 40 MG PO TABS
40.0000 mg | ORAL_TABLET | Freq: Every day | ORAL | Status: DC
  Administered 2013-04-25: 40 mg via ORAL
  Filled 2013-04-24: qty 1

## 2013-04-24 MED ORDER — ONDANSETRON HCL 4 MG PO TABS: 4.0000 mg | ORAL_TABLET | Freq: Four times a day (QID) | ORAL | Status: DC | PRN

## 2013-04-24 MED ORDER — FLUOXETINE HCL 10 MG PO CAPS
10.0000 mg | ORAL_CAPSULE | Freq: Every day | ORAL | Status: DC
  Administered 2013-04-25: 10 mg via ORAL
  Filled 2013-04-24: qty 1

## 2013-04-24 MED ORDER — HEPARIN SODIUM (PORCINE) 5000 UNIT/ML IJ SOLN: 5000.0000 [IU] | Freq: Three times a day (TID) | INTRAMUSCULAR | Status: DC

## 2013-04-24 MED ORDER — SODIUM CHLORIDE 0.9 % IV SOLN
INTRAVENOUS | Status: DC
  Administered 2013-04-24: 12:00:00 via INTRAVENOUS
  Administered 2013-04-24: 1000 mL via INTRAVENOUS

## 2013-04-24 MED ORDER — INSULIN ASPART 100 UNIT/ML ~~LOC~~ SOLN: 0.0000 [IU] | Freq: Three times a day (TID) | SUBCUTANEOUS | Status: DC

## 2013-04-24 NOTE — Progress Notes (Signed)
Dr. Rhona Leavenshiu paged and made aware of pt's arrival to floor.  Also informed him pt's BS was 64mg /dl asymptomatic. And 0J po given and will recheck BS.   No new order. Will continue to monitor.

## 2013-04-24 NOTE — H&P (Signed)
[facility] History and Physical  [name] Pallda Rattigan JXB:[id number] DOB: November 21, 1945 DOA: 04/24/2013  Referring physician: Emergency Department PCP: [name], [name], MD  Specialists:   Chief Complaint: Hypotension  HPI: [name] is a 68 y.o. female  Who [facility] for non-orthostatic near syncope, hypotension, and found to have pyelonephritis and new anterior WMA with follow up stress test demonstrating large infarct from apex into septum and lateral walls. Cardiology recs for medical mgt. D/c summary reviewed. Pt was d/c'd home with 2 more days of abx for UTI. She was also instructed to cont on carvedilol and to discontinue metoprolol. Pt subsequently presented to PCP office today for follow up. Found to be hypotensive and referred to ED. In ED, pt noted to have sbp of 80. BP improved with gentle IVF. Pt complained of self-limited CP and generalized weakness. Per MAR, both carvedilol and metoprolol were taken on day of admit. Hospitalist consulted for admission for hypotension and chest pain.  Review of Systems:  Per above, the remainder of the 10pt ros reviewed and are neg  Past Medical History  Diagnosis Date  . Myocardial infarction 04/2012  . DVT (deep venous thrombosis)     "?RLE; was on coumadin f"or 3 yrs"  . Pneumonia     "3 times" (01/19/2013)  . Exertional asthma   . Type II diabetes mellitus   . GERD (gastroesophageal reflux disease)   . Headache(784.0)     "q other week" (01/19/2013)  . Arthritis     "knees" (01/19/2013)  . Stroke 2005    R sided deficits  . Chronic diastolic CHF (congestive heart failure)   . Coronary artery disease    Past Surgical History  Procedure Laterality Date  . Tubal ligation    . Cataract extraction extracapsular Left   . Dilation and curettage of uterus    . Coronary angioplasty with stent placement  04/2012  . Coronary angioplasty  04/2012   Social History:  reports that she has quit smoking. She has never  used smokeless tobacco. She reports that she does not drink alcohol or use illicit drugs.  where does patient live--home, ALF, SNF? and with whom if at home?  Can patient participate in ADLs?  Allergies  Allergen Reactions  . Penicillins Nausea And Vomiting    Family History  Problem Relation Age of Onset  . Heart attack Mother   . Heart failure Mother   . Stomach cancer Father     (be sure to complete)  Prior to Admission medications   Medication Sig Start Date End Date Taking? Authorizing Provider  aspirin EC 81 MG EC tablet Take 1 tablet (81 mg total) by mouth daily. 04/07/12  Yes Robynn PaneMohan N Harwani, MD  atorvastatin (LIPITOR) 20 MG tablet Take 40 mg by mouth daily.    Yes Historical Provider, MD  calcium-vitamin D (OSCAL WITH D) 500-200 MG-UNIT per tablet Take 1 tablet by mouth daily with breakfast.   Yes Historical Provider, MD  carvedilol (COREG) 3.125 MG tablet Take 1 tablet (3.125 mg total) by mouth 2 (two) times daily with a meal. 04/20/13  Yes Rhetta MuraJai-Gurmukh Samtani, MD  clopidogrel (PLAVIX) 75 MG tablet Take 75 mg by mouth daily.   Yes Historical Provider, MD  ferrous sulfate 325 (65 FE) MG tablet Take 1 tablet (325 mg total) by mouth 3 (three) times daily with meals. 04/07/12  Yes Robynn PaneMohan N Harwani, MD  FLUoxetine (PROZAC) 10 MG capsule Take 10 mg by mouth daily.  Yes Historical Provider, MD  lisinopril (PRINIVIL,ZESTRIL) 5 MG tablet Take 5 mg by mouth 2 (two) times daily.    Yes Historical Provider, MD  metFORMIN (GLUCOPHAGE) 500 MG tablet Take 500 mg by mouth 2 (two) times daily with a meal.   Yes Historical Provider, MD  metoprolol tartrate (LOPRESSOR) 25 MG tablet Take 25 mg by mouth 2 (two) times daily.   Yes Historical Provider, MD  nitroGLYCERIN (NITROSTAT) 0.4 MG SL tablet Place 1 tablet (0.4 mg total) under the tongue every 5 (five) minutes x 3 doses as needed for chest pain. 04/07/12   Robynn PaneMohan N Harwani, MD   Physical Exam: Filed Vitals:   04/24/13 1245 04/24/13 1247 04/24/13  1318 04/24/13 1437  BP: 102/58 108/65 98/60 114/63  Pulse: 85 101 84 87  Temp:      TempSrc:      Resp:   20 19  SpO2:   100% 100%     General:  Awake, in nad  Eyes: PERRL B  ENT: membranes moist, dentition fair  Neck: trachea midline, neck supple  Cardiovascular: regular, s1, s2  Respiratory: normal resp effort, no wheezing  Abdomen: soft, nondistended  Skin: normal skin turgor, no abnormal skin lesions seen  Musculoskeletal: perfused, no clubbingt  Psychiatric: mood/affect normal // no auditory/visual hallucinations  Neurologic: cn2-12 grossly intact, strength/sensation intact  Labs on Admission:  Basic Metabolic Panel:  Recent Labs Lab 04/18/13 0417 04/20/13 0325 04/24/13 1152  NA 141 140 146  K 4.7 4.7 5.0  CL 107 106 114*  CO2 21 24 20   GLUCOSE 82 79 73  BUN 22 19 24*  CREATININE 2.28* 2.30* 2.59*  CALCIUM 8.9 8.6 8.1*   Liver Function Tests: No results found for this basename: AST, ALT, ALKPHOS, BILITOT, PROT, ALBUMIN,  in the last 168 hours No results found for this basename: LIPASE, AMYLASE,  in the last 168 hours No results found for this basename: AMMONIA,  in the last 168 hours CBC:  Recent Labs Lab 04/18/13 0417 04/20/13 0325 04/24/13 1152  WBC 6.5 4.7 5.2  NEUTROABS  --   --  3.0  HGB 9.9* 9.5* 8.8*  HCT 29.8* 30.1* 27.7*  MCV 87.9 90.1 90.5  PLT 311 381 339   Cardiac Enzymes:  Recent Labs Lab 04/24/13 1152  TROPONINI <0.30    BNP (last 3 results)  Recent Labs  01/22/13 1850 01/23/13 0230 04/24/13 1152  PROBNP 5790.0* 6000.0* 736.4*   CBG:  Recent Labs Lab 04/19/13 1236 04/19/13 1725 04/19/13 2138 04/20/13 0738 04/20/13 1218  GLUCAP 110* 78 87 72 75    Radiological Exams on Admission: Dg Chest Port 1 View  04/24/2013   CLINICAL DATA:  Chest pain, right-sided weakness.  EXAM: PORTABLE CHEST - 1 VIEW  COMPARISON:  01/23/2013  FINDINGS: The heart size and mediastinal contours are within normal limits. Both  lungs are clear. The visualized skeletal structures are unremarkable.  IMPRESSION: No active disease.   Electronically Signed   By: Charlett NoseKevin  Dover M.D.   On: 04/24/2013 12:30    EKG: Independently reviewed. Sinus with motion aritfact  Assessment/Plan Principal Problem:   Hypotension Active Problems:   Coronary artery disease   Diabetes mellitus   1. Hypotension 1. Likely secondary to beta blocker OD ([facility]rolol on AM of admit) 2. BP improved with gentle IVF 3. Admit for obs 4. Cont low dose coreg and hold ACEI for now 5. If BP remains stable overnight, consider d/c on  meds originally planned per previous d/c summary 2. CAD 1. Large infarct on recent stress - had already seen Cardiology as inpt during recent admit 2. Currently pain free and denies sob 3. Will follow serial enzymes 4. Monitor on tele 3. DM 1. Diabetic diet 2. Cont on SSI coverage 4. DVT prophylaxis 1. Heparin subQ  Code Status: Full (must indicate code status--if unknown or must be presumed, indicate so) Family Communication: Pt and husband in room (indicate person spoken with, if applicable, with phone number if by telephone) Disposition Plan: Pending (indicate anticipated LOS)  Time spent:  Jerald Kief [facility] Pager [phone]  If 7PM-7AM, please contact night-coverage [email or web address] Password [facility] 04/24/2013, 2:56 PM

## 2013-04-24 NOTE — ED Provider Notes (Signed)
CSN: [id number]     Arrival date & time 04/24/13  1116 History   First MD Initiated Contact with Patient 04/24/13 1121     Chief Complaint  Patient presents with  . Chest Pain  . Hypotension      HPI Pt was seen at 1135. Per EMS, pt's husband, and pt, c/o gradual onset and persistence of constant generalized weakness/fatigue since this morning. Pt had one episode of vague chest "pain" and SOB at approx 0600. Pt was evaluated by her PMD PTA, told her "BP was very low," then sent to the ED for further evaluation and admission. Denies CP/SOB currently, no abd pain, no N/V/D, no back pain, no fevers, no new focal motor weakness, no tingling/numbness in extremities, no syncope.    Past Medical History  Diagnosis Date  . Myocardial infarction 04/2012  . DVT (deep venous thrombosis)     "?RLE; was on coumadin f"or 3 yrs"  . Pneumonia     "3 times" (01/19/2013)  . Exertional asthma   . Type II diabetes mellitus   . GERD (gastroesophageal reflux disease)   . Headache(784.0)     "q other week" (01/19/2013)  . Arthritis     "knees" (01/19/2013)  . Stroke 2005    R sided deficits  . Chronic diastolic CHF (congestive heart failure)   . Coronary artery disease    Past Surgical History  Procedure Laterality Date  . Tubal ligation    . Cataract extraction extracapsular Left   . Dilation and curettage of uterus    . Coronary angioplasty with stent placement  04/2012  . Coronary angioplasty  04/2012   Family History  Problem Relation Age of Onset  . Heart attack Mother   . Heart failure Mother   . Stomach cancer Father    History  Substance Use Topics  . Smoking status: Former Smoker -- 0.50 packs/day for 15 years  . Smokeless tobacco: Never Used  . Alcohol Use: No    Review of Systems ROS: Statement: All systems negative except as marked or noted in the HPI; Constitutional: Negative for fever and chills. +generalized weakness/fatigue.; ; Eyes: Negative for eye pain, redness and  discharge. ; ; ENMT: Negative for ear pain, hoarseness, nasal congestion, sinus pressure and sore throat. ; ; Cardiovascular: +CP. Negative for palpitations, diaphoresis, dyspnea and peripheral edema. ; ; Respiratory: Negative for cough, wheezing and stridor. ; ; Gastrointestinal: Negative for nausea, vomiting, diarrhea, abdominal pain, blood in stool, hematemesis, jaundice and rectal bleeding. . ; ; Genitourinary: Negative for dysuria, flank pain and hematuria. ; ; Musculoskeletal: Negative for back pain and neck pain. Negative for swelling and trauma.; ; Skin: Negative for pruritus, rash, abrasions, blisters, bruising and skin lesion.; ; Neuro:  Negative for headache, lightheadedness and neck stiffness. Negative for altered level of consciousness , altered mental status, extremity weakness, paresthesias, involuntary movement, seizure and syncope.      Allergies  Penicillins  Home Medications   Prior to Admission medications   Medication Sig Start Date End Date Taking? Authorizing Provider  aspirin EC 81 MG EC tablet Take 1 tablet (81 mg total) by mouth daily. 04/07/12   Robynn PaneMohan N Harwani, MD  atorvastatin (LIPITOR) 20 MG tablet Take 40 mg by mouth daily.     Historical Provider, MD  calcium-vitamin D (OSCAL WITH D) 500-200 MG-UNIT per tablet Take 1 tablet by mouth daily with breakfast.    Historical Provider, MD  carvedilol (COREG) 3.125 MG tablet Take 1 tablet (3.125  mg total) by mouth 2 (two) times daily with a meal. 04/20/13   Rhetta Mura, MD  clopidogrel (PLAVIX) 75 MG tablet Take 75 mg by mouth daily.    Historical Provider, MD  ferrous sulfate 325 (65 FE) MG tablet Take 1 tablet (325 mg total) by mouth 3 (three) times daily with meals. 04/07/12   Robynn Pane, MD  FLUoxetine (PROZAC) 10 MG capsule Take 10 mg by mouth daily.    Historical Provider, MD  levofloxacin (LEVAQUIN) 500 MG tablet Take 1 tablet (500 mg total) by mouth every other day. 04/20/13   Rhetta Mura, MD   lisinopril (PRINIVIL,ZESTRIL) 5 MG tablet Take 5 mg by mouth 2 (two) times daily.     Historical Provider, MD  metFORMIN (GLUCOPHAGE) 500 MG tablet Take 500 mg by mouth 2 (two) times daily with a meal.    Historical Provider, MD  nitroGLYCERIN (NITROSTAT) 0.4 MG SL tablet Place 1 tablet (0.4 mg total) under the tongue every 5 (five) minutes x 3 doses as needed for chest pain. 04/07/12   Robynn Pane, MD  pantoprazole (PROTONIX) 40 MG tablet Take 1 tablet (40 mg total) by mouth daily at 6 (six) AM. 04/07/12   Robynn Pane, MD   BP 80/42  Pulse 91  Temp(Src) 98.4 F (36.9 C) (Oral)  Resp 23  SpO2 95% Filed Vitals:   04/24/13 1127  BP: 80/42  Pulse: 91  Temp: 98.4 F (36.9 C)  TempSrc: Oral  Resp: 23  SpO2: 95%    Physical Exam 1140: Physical examination:  Nursing notes reviewed; Vital signs and O2 SAT reviewed;  Constitutional: Well developed, Well nourished, In no acute distress; Head:  Normocephalic, atraumatic; Eyes: EOMI, PERRL, No scleral icterus; ENMT: Mouth and pharynx normal, Mucous membranes dry;; Neck: Supple, Full range of motion, No lymphadenopathy; Cardiovascular: Regular rate and rhythm, No gallop; Respiratory: Breath sounds clear & equal bilaterally, No wheezes. Speaking full sentences with ease, Normal respiratory effort/excursion; Chest: Nontender, Movement normal; Abdomen: Soft, Nontender, Nondistended, Normal bowel sounds; Genitourinary: No CVA tenderness; Extremities: Pulses normal, No tenderness, No edema, No calf edema or asymmetry.; Neuro: Awake, alert, speech slurred per baseline. +right sided weakness per hx previous CVA.; Skin: Color normal, Warm, Dry.    ED Course  Procedures     EKG Interpretation None      MDM  MDM Reviewed: previous chart, nursing note and vitals Reviewed previous: labs and ECG Interpretation: labs, ECG and x-ray Total time providing critical care: 30-74 minutes. This excludes time spent performing separately reportable  procedures and services. Consults: admitting MD   CRITICAL CARE Performed by: Laray Anger Total critical care time: 53 Critical care time was exclusive of separately billable procedures and treating other patients. Critical care was necessary to treat or prevent imminent or life-threatening deterioration. Critical care was time spent personally by me on the following activities: development of treatment plan with patient and/or surrogate as well as nursing, discussions with consultants, evaluation of patient's response to treatment, examination of patient, obtaining history from patient or surrogate, ordering and performing treatments and interventions, ordering and review of laboratory studies, ordering and review of radiographic studies, pulse oximetry and re-evaluation of patient's condition.     Date: 04/24/2013  Rate: 88  Rhythm: normal sinus rhythm, artifact  QRS Axis: normal  Intervals: normal  ST/T Wave abnormalities: normal, artifact  Conduction Disutrbances:none  Narrative Interpretation:   Old EKG Reviewed: unchanged; no significant changes from previous EKG dated 04/15/2013.  Results for orders placed during the hospital encounter of 04/24/13  URINALYSIS, ROUTINE W REFLEX MICROSCOPIC      Result Value Ref Range   Color, Urine YELLOW  YELLOW   APPearance CLEAR  CLEAR   Specific Gravity, Urine 1.019  1.005 - 1.030   pH 5.5  5.0 - 8.0   Glucose, UA NEGATIVE  NEGATIVE mg/dL   Hgb urine dipstick NEGATIVE  NEGATIVE   Bilirubin Urine SMALL (*) NEGATIVE   Ketones, ur 15 (*) NEGATIVE mg/dL   Protein, ur NEGATIVE  NEGATIVE mg/dL   Urobilinogen, UA 0.2  0.0 - 1.0 mg/dL   Nitrite NEGATIVE  NEGATIVE   Leukocytes, UA NEGATIVE  NEGATIVE  CBC WITH DIFFERENTIAL      Result Value Ref Range   WBC 5.2  4.0 - 10.5 K/uL   RBC 3.06 (*) 3.87 - 5.11 MIL/uL   Hemoglobin 8.8 (*) 12.0 - 15.0 g/dL   HCT 16.127.7 (*) 09.636.0 - 04.546.0 %   MCV 90.5  78.0 - 100.0 fL   MCH 28.8  26.0 - 34.0 pg    MCHC 31.8  30.0 - 36.0 g/dL   RDW 40.914.0  81.111.5 - 91.415.5 %   Platelets 339  150 - 400 K/uL   Neutrophils Relative % 56  43 - 77 %   Neutro Abs 3.0  1.7 - 7.7 K/uL   Lymphocytes Relative 33  12 - 46 %   Lymphs Abs 1.7  0.7 - 4.0 K/uL   Monocytes Relative 8  3 - 12 %   Monocytes Absolute 0.4  0.1 - 1.0 K/uL   Eosinophils Relative 2  0 - 5 %   Eosinophils Absolute 0.1  0.0 - 0.7 K/uL   Basophils Relative 1  0 - 1 %   Basophils Absolute 0.0  0.0 - 0.1 K/uL  BASIC METABOLIC PANEL      Result Value Ref Range   Sodium 146  137 - 147 mEq/L   Potassium 5.0  3.7 - 5.3 mEq/L   Chloride 114 (*) 96 - 112 mEq/L   CO2 20  19 - 32 mEq/L   Glucose, Bld 73  70 - 99 mg/dL   BUN 24 (*) 6 - 23 mg/dL   Creatinine, Ser 7.822.59 (*) 0.50 - 1.10 mg/dL   Calcium 8.1 (*) 8.4 - 10.5 mg/dL   GFR calc non Af Amer 18 (*) >90 mL/min   GFR calc Af Amer 21 (*) >90 mL/min  TROPONIN I      Result Value Ref Range   Troponin I <0.30  <0.30 ng/mL  PRO B NATRIURETIC PEPTIDE      Result Value Ref Range   Pro B Natriuretic peptide (BNP) 736.4 (*) 0 - 125 pg/mL   Nm Myocar Multi W/spect W/wall Motion / Ef 04/20/2013   CLINICAL DATA:  Chest pain  EXAM: MYOCARDIAL IMAGING WITH SPECT (REST AND PHARMACOLOGIC-STRESS)  GATED LEFT VENTRICULAR WALL MOTION STUDY  LEFT VENTRICULAR EJECTION FRACTION  TECHNIQUE: Standard myocardial SPECT imaging was performed after resting intravenous injection of 10 mCi [id number] sestamibi. Subsequently, intravenous infusion of Lexiscan was performed under the supervision of the Cardiology staff. At peak effect of the drug, 30 mCi Tc-4475m sestamibi was injected intravenously and standard myocardial SPECT imaging was performed. Quantitative gated imaging was also performed to evaluate left ventricular wall motion, and estimate left ventricular ejection fraction.  COMPARISON:  None.  FINDINGS: SPECT: There is a large fixed defect involving the apex extending into the septum and lateral walls.  There is also hip  diffusely diminished perfusion within the inferior wall. No definite stress-induced perfusion defects.  Wall motion: There is marked hypokinesis of the septum and inferior wall. There is some hypokinesis at the apex.  Ejection fraction 32%. End-diastolic volume 29 cc. End systolic volume 20 cc.  IMPRESSION: Large infarct extending from the apex into the septum and lateral walls. There is fixed diminished perfusion in the inferior wall. No definite stress-induced perfusion defects.   Electronically Signed   By: Maryclare Bean M.D.   On: 04/20/2013 13:33   Dg Chest Port 1 View 04/24/2013   CLINICAL DATA:  Chest pain, right-sided weakness.  EXAM: PORTABLE CHEST - 1 VIEW  COMPARISON:  01/23/2013  FINDINGS: The heart size and mediastinal contours are within normal limits. Both lungs are clear. The visualized skeletal structures are unremarkable.  IMPRESSION: No active disease.   Electronically Signed   By: Charlett Nose M.D.   On: 04/24/2013 12:30     Results for AYASHA, ELLINGSEN (MRN [id number]) as of 04/24/2013 14:03  Ref. Range 04/16/2013 07:55 04/17/2013 03:50 04/18/2013 04:17 04/20/2013 03:25 04/24/2013 11:52  BUN Latest Range: 6-23 mg/dL 31 (H) 25 (H) 22 19 24  (H)  Creatinine Latest Range: 0.50-1.10 mg/dL 4.09 (H) 8.11 (H) 9.14 (H) 2.30 (H) 2.59 (H)     1400:   SBP 80 on arrival to ED. Pt given judicious IVF bolus (due to hx CHF) with SBP increasing to 103. Pt's SBP starting to trend downward again, will admit. Doubt sepsis as cause for low BP with normal WBC count, lactic acid and pt afebrile. Pt's husband does state pt's BP meds "were changed around" during her admission last week; question medication confusion. BUN/Cr mildly elevated today compared to baseline. BNP lower than previous and no CHF on CXR. Pt continues to deny CP/SOB since arrival to ED. Dx and testing d/w pt and family.  Questions answered.  Verb understanding, agreeable to admit. T/C to [facility]. Rhona Leavens, case discussed, including:  HPI, pertinent PM/SHx,  VS/PE, dx testing, ED course and treatment:  Agreeable to observation admit, requests to write temporary orders, obtain tele bed to team 10.     Laray Anger, DO 04/26/13 [phone]

## 2013-04-24 NOTE — ED Notes (Signed)
[facility] presents with a 68 yo female with chest pain coming from her doctor's office and also hypotensive.  Pt was given 324 ASA by [facility] en route to this facility but no NTG because of hypotensive state.  Pt states that she has had this episode of pain since 6 am this morning.  Pt was orthostatic at physician's office with a palpated pressure of 92/p.  Pt took all BP meds this morning and is on Plavix.  Hx of CVA, speech is slurred but residual from previous CVA.  12 lead unremarkable.

## 2013-04-25 DIAGNOSIS — E43 Unspecified severe protein-calorie malnutrition: Secondary | ICD-10-CM

## 2013-04-25 DIAGNOSIS — I5032 Chronic diastolic (congestive) heart failure: Secondary | ICD-10-CM

## 2013-04-25 DIAGNOSIS — E119 Type 2 diabetes mellitus without complications: Secondary | ICD-10-CM

## 2013-04-25 DIAGNOSIS — I509 Heart failure, unspecified: Secondary | ICD-10-CM

## 2013-04-25 DIAGNOSIS — I959 Hypotension, unspecified: Secondary | ICD-10-CM

## 2013-04-25 LAB — COMPREHENSIVE METABOLIC PANEL
ALT: 7 U/L (ref 0–35)
AST: 18 U/L (ref 0–37)
Albumin: 2.9 g/dL — ABNORMAL LOW (ref 3.5–5.2)
Alkaline Phosphatase: 72 U/L (ref 39–117)
BUN: 24 mg/dL — ABNORMAL HIGH (ref 6–23)
CHLORIDE: 107 meq/L (ref 96–112)
CO2: 21 meq/L (ref 19–32)
CREATININE: 2.53 mg/dL — AB (ref 0.50–1.10)
Calcium: 8.6 mg/dL (ref 8.4–10.5)
GFR calc Af Amer: 21 mL/min — ABNORMAL LOW (ref >90)
GFR, EST NON AFRICAN AMERICAN: 18 mL/min — AB (ref >90)
GLUCOSE: 84 mg/dL (ref 70–99)
Potassium: 5.5 mEq/L — ABNORMAL HIGH (ref 3.7–5.3)
Sodium: 142 mEq/L (ref 137–147)
Total Protein: 6.6 g/dL (ref 6.0–8.3)

## 2013-04-25 LAB — CBC
HCT: 31.4 % — ABNORMAL LOW (ref 36.0–46.0)
Hemoglobin: 9.9 g/dL — ABNORMAL LOW (ref 12.0–15.0)
MCH: 28.7 pg (ref 26.0–34.0)
MCHC: 31.5 g/dL (ref 30.0–36.0)
MCV: 91 fL (ref 78.0–100.0)
PLATELETS: 367 10*3/uL (ref 150–400)
RBC: 3.45 MIL/uL — ABNORMAL LOW (ref 3.87–5.11)
RDW: 14.1 % (ref 11.5–15.5)
WBC: 7.3 10*3/uL (ref 4.0–10.5)

## 2013-04-25 LAB — URINE CULTURE
CULTURE: NO GROWTH
Colony Count: NO GROWTH

## 2013-04-25 LAB — GLUCOSE, CAPILLARY
GLUCOSE-CAPILLARY: 74 mg/dL (ref 70–99)
GLUCOSE-CAPILLARY: 115 mg/dL — AB (ref 70–99)

## 2013-04-25 NOTE — Progress Notes (Signed)
Pt refusing to have lab draw this morning, refers she will like labs to be done at a later time.

## 2013-04-25 NOTE — Discharge Summary (Signed)
Physician Discharge Summary  [name] RUE:[id number] DOB: 1945/08/05 DOA: 04/24/2013  PCP: Willey Blade, MD  Admit date: 04/24/2013 Discharge date: 04/25/2013  Time spent: 35 minutes  Recommendations for Outpatient Follow-up:  1. Follow up with  PCP in 2 weeks. 2. Cardiology 1 week.  Discharge Diagnoses:  Principal Problem:   Hypotension Active Problems:   Coronary artery disease   Diabetes mellitus   Discharge Condition: stable  Diet recommendation: low sodium diet  Filed Weights   04/24/13 1520 04/25/13 0627  Weight: 48.081 kg (106 lb) 48.2 kg (106 lb 4.2 oz)    History of present illness:  68 y.o. female  Who was recently discharged from [facility] for non-orthostatic near syncope, hypotension, and found to have pyelonephritis and new anterior WMA with follow up stress test demonstrating large infarct from apex into septum and lateral walls. Cardiology recs for medical mgt. D/c summary reviewed. Pt was d/c'd home with 2 more days of abx for UTI. She was also instructed to cont on carvedilol and to discontinue metoprolol. Pt subsequently presented to PCP office today for follow up. Found to be hypotensive and referred to ED. In ED, pt noted to have sbp of 80. BP improved with gentle IVF. Pt complained of self-limited CP and generalized weakness. Per MAR, both carvedilol and metoprolol were taken on day of admit. Hospitalist consulted for admission for hypotension and chest pain.   Hospital Course:  Hypotension : - Likely secondary to beta blocker unintentional OD,  Both coreg plus metoprolol on AM of admit. - BP improved with gentle IVF - Cont low dose coreg and resume  ACEI as an outpatinet.  DM : - Diabetic diet, stable cont home meds.   Procedures:  CXR  Consultations:  none  Discharge Exam: Filed Vitals:   04/25/13 0757  BP: 107/63  Pulse: 90  Temp:   Resp:     General: A&Ox2  Cardiovascular: RRR Respiratory: good air movement CTA  B/L  Discharge Instructions You were cared for by a hospitalist during your hospital stay. If you have any questions about your discharge medications or the care you received while you were in the hospital after you are discharged, you can call the unit and asked to speak with the hospitalist on call if the hospitalist that took care of you is not available. Once you are discharged, your primary care physician will handle any further medical issues. Please note that NO REFILLS for any discharge medications will be authorized once you are discharged, as it is imperative that you return to your primary care physician (or establish a relationship with a primary care physician if you do not have one) for your aftercare needs so that they can reassess your need for medications and monitor your lab values.  Discharge Orders   Future Orders Complete By Expires   Diet - low sodium heart healthy  As directed    Increase activity slowly  As directed        Medication List    STOP taking these medications       metoprolol tartrate 25 MG tablet  Commonly known as:  LOPRESSOR      TAKE these medications       aspirin 81 MG EC tablet  Take 1 tablet (81 mg total) by mouth daily.     atorvastatin 20 MG tablet  Commonly known as:  LIPITOR  Take 40 mg by mouth daily.     calcium-vitamin D 500-200 MG-UNIT per tablet  Commonly known as:  OSCAL WITH D  Take 1 tablet by mouth daily with breakfast.     carvedilol 3.125 MG tablet  Commonly known as:  COREG  Take 1 tablet (3.125 mg total) by mouth 2 (two) times daily with a meal.     clopidogrel 75 MG tablet  Commonly known as:  PLAVIX  Take 75 mg by mouth daily.     ferrous sulfate 325 (65 FE) MG tablet  Take 1 tablet (325 mg total) by mouth 3 (three) times daily with meals.     FLUoxetine 10 MG capsule  Commonly known as:  PROZAC  Take 10 mg by mouth daily.     lisinopril 5 MG tablet  Commonly known as:  PRINIVIL,ZESTRIL  Take 5 mg by  mouth 2 (two) times daily.     metFORMIN 500 MG tablet  Commonly known as:  GLUCOPHAGE  Take 500 mg by mouth 2 (two) times daily with a meal.     nitroGLYCERIN 0.4 MG SL tablet  Commonly known as:  NITROSTAT  Place 1 tablet (0.4 mg total) under the tongue every 5 (five) minutes x 3 doses as needed for chest pain.       Allergies  Allergen Reactions  . Penicillins Nausea And Vomiting       Follow-up Information   Follow up with August SaucerEAN, [name], MD In 1 week. (hospital follow up)    Specialty:  Internal Medicine   Contact information:   [facility] [address] [place]  [place] [id number] [phone]        The results of significant diagnostics from this hospitalization (including imaging, microbiology, ancillary and laboratory) are listed below for reference.    Significant Diagnostic Studies: Ct Abdomen Pelvis Wo Contrast  04/15/2013   CLINICAL DATA:  Abdominal pain, weakness, vomiting. Right-sided deficit from previous stroke. No IV contrast due to renal insufficiency.  EXAM: CT ABDOMEN AND PELVIS WITHOUT CONTRAST  TECHNIQUE: Multidetector CT imaging of the abdomen and pelvis was performed following the standard protocol without intravenous contrast.  COMPARISON:  06/17/2008  FINDINGS: Infiltration or atelectasis in both lung bases. Coronary artery calcifications.  Unenhanced appearance of the liver, spleen, gallbladder, pancreas, adrenal glands, inferior vena cava, and retroperitoneal lymph nodes are unremarkable. There are multiple hypodense lesions in both kidneys likely representing cysts. These were present on the previous study without significant change. Calcification of the abdominal aorta without aneurysm. The stomach, small bowel, and colon are not abnormally distended. No free air or free fluid in the abdomen.  Pelvis: Uterus and adnexal structures are not enlarged. No free pelvic fluid collections. Appendix is normal. No evidence of diverticulitis. No  significant pelvic lymphadenopathy. No destructive bone lesions appreciated.  IMPRESSION: No acute process demonstrated in the abdomen or pelvis on unenhanced scans. There is infiltration or atelectasis demonstrated in both lung bases.   Electronically Signed   By: Burman NievesWilliam  Stevens M.D.   On: 04/15/2013 22:38   Ct Head Wo Contrast  04/15/2013   CLINICAL DATA:  Headache, vomiting, weakness  EXAM: CT HEAD WITHOUT CONTRAST  TECHNIQUE: Contiguous axial images were obtained from the base of the skull through the vertex without intravenous contrast.  COMPARISON:  CT HEAD W/O CM dated 01/19/2013; MR HEAD W/O CM dated 09/09/2010  FINDINGS: Stable chronic stool limit lacunar infarcts bilaterally. Stable area of encephalomalacia medial left occipital lobe. Mild to moderate diffuse atrophy with mild low attenuation in the deep white matter. No hydrocephalus. No hemorrhage, extra-axial fluid,  or acute infarct.  The calvarium is intact. Frontal sinuses are not formed. There is essentially complete opacification of ethmoid air cells. There is complete opacification of the visualized portion of the left maxillary sinus and near complete opacification of the right maxillary sinus with an air-fluid level limited. Sphenoid sinuses are essentially completely opacified.  IMPRESSION: Chronic brain abnormalities.  New severe pansinusitis.   Electronically Signed   By: Esperanza Heir M.D.   On: 04/15/2013 19:32   Nm Myocar Multi W/spect W/wall Motion / Ef  04/20/2013   CLINICAL DATA:  Chest pain  EXAM: MYOCARDIAL IMAGING WITH SPECT (REST AND PHARMACOLOGIC-STRESS)  GATED LEFT VENTRICULAR WALL MOTION STUDY  LEFT VENTRICULAR EJECTION FRACTION  TECHNIQUE: Standard myocardial SPECT imaging was performed after resting intravenous injection of 10 mCi Tc-29m sestamibi. Subsequently, intravenous infusion of Lexiscan was performed under the supervision of the Cardiology staff. At peak effect of the drug, 30 mCi Tc-62m sestamibi was injected  intravenously and standard myocardial SPECT imaging was performed. Quantitative gated imaging was also performed to evaluate left ventricular wall motion, and estimate left ventricular ejection fraction.  COMPARISON:  None.  FINDINGS: SPECT: There is a large fixed defect involving the apex extending into the septum and lateral walls. There is also hip diffusely diminished perfusion within the inferior wall. No definite stress-induced perfusion defects.  Wall motion: There is marked hypokinesis of the septum and inferior wall. There is some hypokinesis at the apex.  Ejection fraction 32%. End-diastolic volume 29 cc. End systolic volume 20 cc.  IMPRESSION: Large infarct extending from the apex into the septum and lateral walls. There is fixed diminished perfusion in the inferior wall. No definite stress-induced perfusion defects.   Electronically Signed   By: Maryclare Bean M.D.   On: 04/20/2013 13:33   Dg Chest Port 1 View  04/24/2013   CLINICAL DATA:  Chest pain, right-sided weakness.  EXAM: PORTABLE CHEST - 1 VIEW  COMPARISON:  01/23/2013  FINDINGS: The heart size and mediastinal contours are within normal limits. Both lungs are clear. The visualized skeletal structures are unremarkable.  IMPRESSION: No active disease.   Electronically Signed   By: Charlett Nose M.D.   On: 04/24/2013 12:30   Dg Abd Acute W/chest  04/15/2013   CLINICAL DATA:  Abdominal pain, weakness, vomiting.  EXAM: ACUTE ABDOMEN SERIES (ABDOMEN 2 VIEW & CHEST 1 VIEW)  COMPARISON:  Chest radiograph 01/23/2013  FINDINGS: Numerous cardiac leads project over the lower chest and abdomen. Heart and mediastinal contours are stable. Pulmonary vascularity is within normal limits. There is mild left basilar atelectasis. No visible pleural effusion. No evidence of free intraperitoneal air on a decubitus right side up view. There is gaseous distention of the proximal colon. Cecum measures up to 6 cm transverse diameter. No small bowel dilatation is seen.  Facet joint degenerative changes of the lower lumbar spine.  IMPRESSION: Mild dilatation of the proximal colon. Remainder of the bowel gas pattern is within normal limits.  Negative for free intraperitoneal air.  Mild left basilar atelectasis.   Electronically Signed   By: Britta Mccreedy M.D.   On: 04/15/2013 19:27    Microbiology: Recent Results (from the past 240 hour(s))  URINE CULTURE     Status: None   Collection Time    04/18/13 12:00 PM      Result Value Ref Range Status   Specimen Description URINE, RANDOM   Final   Special Requests NONE   Final   Culture  Setup Time  Final   Value: 04/18/2013 21:35     Performed at [facility]   Colony Count     Final   Value: NO GROWTH     [facility]formed at [facility]   Culture     Final   Value: NO GROWTH     Performed at [facility]   Report Status 04/19/2013 FINAL   Final     Labs: Basic Metabolic Panel:  Recent Labs Lab 04/20/13 0325 04/24/13 1152 04/24/13 1640  NA 140 146  --   K 4.7 5.0  --   CL 106 114*  --   CO2 24 20  --   GLUCOSE 79 73  --   BUN 19 24*  --   CREATININE 2.30* 2.59* 2.79*  CALCIUM 8.6 8.1*  --    Liver Function Tests: No results found for this basename: AST, ALT, ALKPHOS, BILITOT, PROT, ALBUMIN,  in the last 168 hours No results found for this basename: LIPASE, AMYLASE,  in the last 168 hours No results found for this basename: AMMONIA,  in the last 168 hours CBC:  Recent Labs Lab 04/20/13 0325 04/24/13 1152 04/24/13 1640  WBC 4.7 5.2 6.0  NEUTROABS  --  3.0  --   HGB 9.5* 8.8* 10.1*  HCT 30.1* 27.7* 32.1*  MCV 90.1 90.5 92.2  PLT 381 339 356   Cardiac Enzymes:  Recent Labs Lab 04/24/13 1152  TROPONINI <0.30   BNP: BNP (last 3 results)  Recent Labs  01/22/13 1850 01/23/13 0230 04/24/13 1152  PROBNP 5790.0* 6000.0* 736.4*   CBG:  Recent Labs Lab 04/20/13 1218 04/24/13 1630 04/24/13 1728 04/24/13 2128 04/25/13 0551  GLUCAP 75 64* 75 82 74      Signed:  Marinda ElkAbraham Feliz Ortiz  [facility] 04/25/2013, 7:59 AM

## 2013-06-15 ENCOUNTER — Emergency Department (HOSPITAL_COMMUNITY): Payer: Medicare Other

## 2013-06-15 ENCOUNTER — Inpatient Hospital Stay (HOSPITAL_COMMUNITY)
Admission: EM | Admit: 2013-06-15 | Discharge: 2013-06-18 | DRG: 683 | Disposition: A | Discharge status: Home / Self Care | Payer: Medicare Other | Attending: Internal Medicine | Admitting: Internal Medicine

## 2013-06-15 ENCOUNTER — Encounter (HOSPITAL_COMMUNITY): Payer: Self-pay | Admitting: Emergency Medicine

## 2013-06-15 DIAGNOSIS — K219 Gastro-esophageal reflux disease without esophagitis: Secondary | ICD-10-CM | POA: Diagnosis present

## 2013-06-15 DIAGNOSIS — Z7902 Long term (current) use of antithrombotics/antiplatelets: Secondary | ICD-10-CM

## 2013-06-15 DIAGNOSIS — N179 Acute kidney failure, unspecified: Principal | ICD-10-CM

## 2013-06-15 DIAGNOSIS — I509 Heart failure, unspecified: Secondary | ICD-10-CM | POA: Diagnosis present

## 2013-06-15 DIAGNOSIS — Z86718 Personal history of other venous thrombosis and embolism: Secondary | ICD-10-CM

## 2013-06-15 DIAGNOSIS — Z8673 Personal history of transient ischemic attack (TIA), and cerebral infarction without residual deficits: Secondary | ICD-10-CM

## 2013-06-15 DIAGNOSIS — Z9861 Coronary angioplasty status: Secondary | ICD-10-CM

## 2013-06-15 DIAGNOSIS — Z87891 Personal history of nicotine dependence: Secondary | ICD-10-CM

## 2013-06-15 DIAGNOSIS — Z8249 Family history of ischemic heart disease and other diseases of the circulatory system: Secondary | ICD-10-CM

## 2013-06-15 DIAGNOSIS — E1169 Type 2 diabetes mellitus with other specified complication: Secondary | ICD-10-CM | POA: Diagnosis present

## 2013-06-15 DIAGNOSIS — Z681 Body mass index (BMI) 19 or less, adult: Secondary | ICD-10-CM

## 2013-06-15 DIAGNOSIS — I2510 Atherosclerotic heart disease of native coronary artery without angina pectoris: Secondary | ICD-10-CM | POA: Diagnosis present

## 2013-06-15 DIAGNOSIS — D649 Anemia, unspecified: Secondary | ICD-10-CM

## 2013-06-15 DIAGNOSIS — I5032 Chronic diastolic (congestive) heart failure: Secondary | ICD-10-CM

## 2013-06-15 DIAGNOSIS — Z880 Allergy status to penicillin: Secondary | ICD-10-CM

## 2013-06-15 DIAGNOSIS — I252 Old myocardial infarction: Secondary | ICD-10-CM

## 2013-06-15 DIAGNOSIS — R634 Abnormal weight loss: Secondary | ICD-10-CM | POA: Diagnosis present

## 2013-06-15 DIAGNOSIS — E1129 Type 2 diabetes mellitus with other diabetic kidney complication: Secondary | ICD-10-CM

## 2013-06-15 DIAGNOSIS — D638 Anemia in other chronic diseases classified elsewhere: Secondary | ICD-10-CM | POA: Diagnosis present

## 2013-06-15 DIAGNOSIS — Z79899 Other long term (current) drug therapy: Secondary | ICD-10-CM

## 2013-06-15 DIAGNOSIS — Z9849 Cataract extraction status, unspecified eye: Secondary | ICD-10-CM

## 2013-06-15 DIAGNOSIS — N184 Chronic kidney disease, stage 4 (severe): Secondary | ICD-10-CM

## 2013-06-15 DIAGNOSIS — E860 Dehydration: Secondary | ICD-10-CM | POA: Diagnosis present

## 2013-06-15 DIAGNOSIS — Z7982 Long term (current) use of aspirin: Secondary | ICD-10-CM

## 2013-06-15 DIAGNOSIS — I959 Hypotension, unspecified: Secondary | ICD-10-CM

## 2013-06-15 DIAGNOSIS — Z9851 Tubal ligation status: Secondary | ICD-10-CM

## 2013-06-15 DIAGNOSIS — E875 Hyperkalemia: Secondary | ICD-10-CM

## 2013-06-15 DIAGNOSIS — N189 Chronic kidney disease, unspecified: Secondary | ICD-10-CM

## 2013-06-15 DIAGNOSIS — E43 Unspecified severe protein-calorie malnutrition: Secondary | ICD-10-CM

## 2013-06-15 DIAGNOSIS — Z800 Family history of malignant neoplasm of digestive organs: Secondary | ICD-10-CM

## 2013-06-15 LAB — URINALYSIS, ROUTINE W REFLEX MICROSCOPIC
BILIRUBIN URINE: NEGATIVE
Glucose, UA: NEGATIVE mg/dL
Hgb urine dipstick: NEGATIVE
KETONES UR: NEGATIVE mg/dL
LEUKOCYTES UA: NEGATIVE
NITRITE: NEGATIVE
Protein, ur: NEGATIVE mg/dL
Specific Gravity, Urine: 1.016 (ref 1.005–1.030)
UROBILINOGEN UA: 0.2 mg/dL (ref 0.0–1.0)
pH: 5 (ref 5.0–8.0)

## 2013-06-15 LAB — CBC WITH DIFFERENTIAL/PLATELET
BASOS ABS: 0.1 10*3/uL (ref 0.0–0.1)
BASOS PCT: 1 % (ref 0–1)
EOS ABS: 0.2 10*3/uL (ref 0.0–0.7)
Eosinophils Relative: 3 % (ref 0–5)
HCT: 31.5 % — ABNORMAL LOW (ref 36.0–46.0)
HEMOGLOBIN: 10.1 g/dL — AB (ref 12.0–15.0)
Lymphocytes Relative: 40 % (ref 12–46)
Lymphs Abs: 2.1 10*3/uL (ref 0.7–4.0)
MCH: 28.5 pg (ref 26.0–34.0)
MCHC: 32.1 g/dL (ref 30.0–36.0)
MCV: 89 fL (ref 78.0–100.0)
MONO ABS: 0.3 10*3/uL (ref 0.1–1.0)
MONOS PCT: 5 % (ref 3–12)
NEUTROS ABS: 2.7 10*3/uL (ref 1.7–7.7)
NEUTROS PCT: 51 % (ref 43–77)
Platelets: 258 10*3/uL (ref 150–400)
RBC: 3.54 MIL/uL — ABNORMAL LOW (ref 3.87–5.11)
RDW: 15.5 % (ref 11.5–15.5)
WBC: 5.4 10*3/uL (ref 4.0–10.5)

## 2013-06-15 LAB — BASIC METABOLIC PANEL
BUN: 57 mg/dL — AB (ref 6–23)
BUN: 55 mg/dL — AB (ref 6–23)
CALCIUM: 9.1 mg/dL (ref 8.4–10.5)
CHLORIDE: 105 meq/L (ref 96–112)
CO2: 16 mEq/L — ABNORMAL LOW (ref 19–32)
CO2: 16 mEq/L — ABNORMAL LOW (ref 19–32)
CREATININE: 3.49 mg/dL — AB (ref 0.50–1.10)
Calcium: 9.9 mg/dL (ref 8.4–10.5)
Chloride: 110 mEq/L (ref 96–112)
Creatinine, Ser: 3.58 mg/dL — ABNORMAL HIGH (ref 0.50–1.10)
GFR calc non Af Amer: 12 mL/min — ABNORMAL LOW (ref >90)
GFR, EST AFRICAN AMERICAN: 14 mL/min — AB (ref >90)
GFR, EST AFRICAN AMERICAN: 14 mL/min — AB (ref >90)
GFR, EST NON AFRICAN AMERICAN: 12 mL/min — AB (ref >90)
Glucose, Bld: 97 mg/dL (ref 70–99)
Glucose, Bld: 86 mg/dL (ref 70–99)
Potassium: 6.5 mEq/L (ref 3.7–5.3)
Potassium: 6.6 mEq/L (ref 3.7–5.3)
Sodium: 137 mEq/L (ref 137–147)
Sodium: 140 mEq/L (ref 137–147)

## 2013-06-15 LAB — GLUCOSE, CAPILLARY: Glucose-Capillary: 142 mg/dL — ABNORMAL HIGH (ref 70–99)

## 2013-06-15 MED ORDER — CALCIUM CARBONATE-VITAMIN D 500-200 MG-UNIT PO TABS
1.0000 | ORAL_TABLET | Freq: Every day | ORAL | Status: DC
  Administered 2013-06-16 – 2013-06-18 (×3): 1 via ORAL
  Filled 2013-06-15 (×4): qty 1

## 2013-06-15 MED ORDER — SODIUM CHLORIDE 0.9 % IV SOLN: INTRAVENOUS | Status: AC

## 2013-06-15 MED ORDER — SODIUM POLYSTYRENE SULFONATE 15 GM/60ML PO SUSP
30.0000 g | Freq: Once | ORAL | Status: AC
  Administered 2013-06-15: 30 g via ORAL
  Filled 2013-06-15: qty 120

## 2013-06-15 MED ORDER — ACETAMINOPHEN 650 MG RE SUPP: 650.0000 mg | Freq: Four times a day (QID) | RECTAL | Status: DC | PRN

## 2013-06-15 MED ORDER — ASPIRIN 81 MG PO CHEW
81.0000 mg | CHEWABLE_TABLET | Freq: Every day | ORAL | Status: DC
  Administered 2013-06-16 – 2013-06-18 (×3): 81 mg via ORAL
  Filled 2013-06-15 (×3): qty 1

## 2013-06-15 MED ORDER — CLOPIDOGREL BISULFATE 75 MG PO TABS
75.0000 mg | ORAL_TABLET | Freq: Every day | ORAL | Status: DC
Start: 2013-06-16 — End: 2013-06-18
  Administered 2013-06-16 – 2013-06-18 (×3): 75 mg via ORAL
  Filled 2013-06-15 (×4): qty 1

## 2013-06-15 MED ORDER — SODIUM CHLORIDE 0.9 % IV BOLUS (SEPSIS)
500.0000 mL | Freq: Once | INTRAVENOUS | Status: AC
  Administered 2013-06-15: 500 mL via INTRAVENOUS

## 2013-06-15 MED ORDER — ATORVASTATIN CALCIUM 20 MG PO TABS
20.0000 mg | ORAL_TABLET | Freq: Every day | ORAL | Status: DC
  Administered 2013-06-16 – 2013-06-18 (×3): 20 mg via ORAL
  Filled 2013-06-15 (×3): qty 1

## 2013-06-15 MED ORDER — FLUOXETINE HCL 10 MG PO CAPS
10.0000 mg | ORAL_CAPSULE | Freq: Every day | ORAL | Status: DC
  Administered 2013-06-16 – 2013-06-18 (×3): 10 mg via ORAL
  Filled 2013-06-15 (×3): qty 1

## 2013-06-15 MED ORDER — ALBUTEROL SULFATE (2.5 MG/3ML) 0.083% IN NEBU
10.0000 mg | INHALATION_SOLUTION | Freq: Once | RESPIRATORY_TRACT | Status: DC
  Filled 2013-06-15: qty 12

## 2013-06-15 MED ORDER — HEPARIN SODIUM (PORCINE) 5000 UNIT/ML IJ SOLN
5000.0000 [IU] | Freq: Three times a day (TID) | INTRAMUSCULAR | Status: DC
  Administered 2013-06-15 – 2013-06-18 (×8): 5000 [IU] via SUBCUTANEOUS
  Filled 2013-06-15 (×12): qty 1

## 2013-06-15 MED ORDER — SODIUM CHLORIDE 0.9 % IV SOLN
1.0000 g | INTRAVENOUS | Status: AC
  Administered 2013-06-15: 1 g via INTRAVENOUS
  Filled 2013-06-15: qty 10

## 2013-06-15 MED ORDER — INSULIN ASPART 100 UNIT/ML ~~LOC~~ SOLN
0.0000 [IU] | Freq: Three times a day (TID) | SUBCUTANEOUS | Status: DC
  Administered 2013-06-15: 1 [IU] via SUBCUTANEOUS

## 2013-06-15 MED ORDER — ALBUTEROL (5 MG/ML) CONTINUOUS INHALATION SOLN
10.0000 mg/h | INHALATION_SOLUTION | RESPIRATORY_TRACT | Status: DC
  Administered 2013-06-15: 10 mg/h via RESPIRATORY_TRACT
  Filled 2013-06-15: qty 20

## 2013-06-15 MED ORDER — ONDANSETRON HCL 4 MG/2ML IJ SOLN: 4.0000 mg | Freq: Four times a day (QID) | INTRAMUSCULAR | Status: DC | PRN

## 2013-06-15 MED ORDER — PANTOPRAZOLE SODIUM 40 MG PO TBEC
40.0000 mg | DELAYED_RELEASE_TABLET | Freq: Every day | ORAL | Status: DC
  Administered 2013-06-16 – 2013-06-18 (×3): 40 mg via ORAL
  Filled 2013-06-15 (×3): qty 1

## 2013-06-15 MED ORDER — SODIUM CHLORIDE 0.9 % IJ SOLN
3.0000 mL | Freq: Two times a day (BID) | INTRAMUSCULAR | Status: DC
  Administered 2013-06-16 – 2013-06-17 (×3): 3 mL via INTRAVENOUS

## 2013-06-15 MED ORDER — FERROUS SULFATE 325 (65 FE) MG PO TABS
325.0000 mg | ORAL_TABLET | Freq: Three times a day (TID) | ORAL | Status: DC
  Administered 2013-06-15 – 2013-06-18 (×9): 325 mg via ORAL
  Filled 2013-06-15 (×11): qty 1

## 2013-06-15 MED ORDER — INSULIN ASPART 100 UNIT/ML IV SOLN
5.0000 [IU] | INTRAVENOUS | Status: AC
  Administered 2013-06-15: 5 [IU] via INTRAVENOUS
  Filled 2013-06-15: qty 0.05

## 2013-06-15 MED ORDER — DEXTROSE 50 % IV SOLN
1.0000 | Freq: Once | INTRAVENOUS | Status: AC
  Administered 2013-06-15: 50 mL via INTRAVENOUS
  Filled 2013-06-15: qty 50

## 2013-06-15 MED ORDER — POLYETHYLENE GLYCOL 3350 17 G PO PACK
17.0000 g | PACK | Freq: Every day | ORAL | Status: DC | PRN
  Filled 2013-06-15: qty 1

## 2013-06-15 MED ORDER — ACETAMINOPHEN 325 MG PO TABS: 650.0000 mg | ORAL_TABLET | Freq: Four times a day (QID) | ORAL | Status: DC | PRN

## 2013-06-15 MED ORDER — ONDANSETRON HCL 4 MG PO TABS
4.0000 mg | ORAL_TABLET | Freq: Four times a day (QID) | ORAL | Status: DC | PRN
Start: 2013-06-15 — End: 2013-06-18

## 2013-06-15 NOTE — Progress Notes (Signed)
Admission note: Patient transferred from ED around 4:40 pm on a bed. Mental Orientation:  Alert & oriented x 4. Telemetry: Patient is on Telemetry and and notified CCMD. Assessment: See doc flowsheets Skin: Dry and Intact, appropriate to ethnicity. IV: Left forearm 75 ml/hr NS Pain: None  Tubes: None. Safety Measures: Bed alarm, socks, arm band, fall prevention safety plan Admission Screening: Unable to obtain at this time due to incomprehensible speech and awaiting family member to arrive. 6700 Orientation: Patient has been oriented to the unit, staff and to the room.

## 2013-06-15 NOTE — ED Notes (Signed)
Patient transported to X-ray 

## 2013-06-15 NOTE — ED Notes (Signed)
Pt. returned from XR. 

## 2013-06-15 NOTE — ED Notes (Signed)
Dr. Docherty informed of pt's potassium 

## 2013-06-15 NOTE — ED Notes (Addendum)
Pt husband states that she has had recently changes to her medication including BP and cholesterol medication. Pt has been seen at [facility] for low BP prior to today's visit and she has been having her BP drop every other day. Pt take lisinopril for BP 5mg  once a day every morning. Pt has taken her morning medicaiton. Pt also takes cardvediol for her heart twice a day. Pt BP today was same as at home.

## 2013-06-15 NOTE — H&P (Signed)
[facility] History and Physical  [name] GYI:[id number] DOB: Dec 07, 1945 DOA: 06/15/2013  Referring physician: Dr.Docherty PCP: Kevan Ny, MD   Chief Complaint: Hypotension  HPI: [name] is a 68 y.o. female past medical history of recurrent admissions unable to take her medications appropriately, chronic renal disease with a baseline creatinine of 2.5-2.7, diabetes mellitus with last hemoglobin A1c of 5.6 . Recently DC'd from the hospital 2 months prior to admission. Has been having generalized weakness 3-4 days prior to admission. As per home health nurse she was found to be hypotensive on the day of admission she was brought to the emergency room. She and her husband relate no cough, fever, chills, nausea, vomiting or diarrhea. He relates she's been having poor oral intake and her eating has been erratic.  In the ED: She was found to be hypotensive and with a potassium of 6.6 and new acute kidney injury. She was given a liter of normal saline, Kayexalate calcium insulin and glucose, EKG does not show peak T waves we are consulted for further evaluation.   Review of Systems:  Constitutional:  No weight loss, night sweats, Fevers, chills, fatigue.  HEENT:  No headaches, Difficulty swallowing,Tooth/dental problems,Sore throat,  No sneezing, itching, ear ache, nasal congestion, post nasal drip,  Cardio-vascular:  No chest pain, Orthopnea, PND, swelling in lower extremities, anasarca, dizziness, palpitations  GI:  No heartburn, indigestion, abdominal pain, nausea, vomiting, diarrhea, change in bowel habits, loss of appetite  Resp:  No shortness of breath with exertion or at rest. No excess mucus, no productive cough, No non-productive cough, No coughing up of blood.No change in color of mucus.No wheezing.No chest wall deformity  Skin:  no rash or lesions.  GU:  no dysuria, change in color of urine, no urgency or frequency. No flank pain.  Musculoskeletal:  No joint pain or  swelling. No decreased range of motion. No back pain.  Psych:  No change in mood or affect. No depression or anxiety. No memory loss.   Past Medical History  Diagnosis Date  . Myocardial infarction 04/2012  . DVT (deep venous thrombosis)     "?RLE; was on coumadin f"or 3 yrs"  . Pneumonia     "3 times" (01/19/2013)  . Exertional asthma   . Type II diabetes mellitus   . GERD (gastroesophageal reflux disease)   . Headache(784.0)     "q other week" (01/19/2013)  . Arthritis     "knees" (01/19/2013)  . Stroke 2005    R sided deficits  . Chronic diastolic CHF (congestive heart failure)   . Coronary artery disease    Past Surgical History  Procedure Laterality Date  . Tubal ligation    . Cataract extraction extracapsular Left   . Dilation and curettage of uterus    . Coronary angioplasty with stent placement  04/2012  . Coronary angioplasty  04/2012   Social History:  reports that she has quit smoking. She has never used smokeless tobacco. She reports that she does not drink alcohol or use illicit drugs.  Allergies  Allergen Reactions  . Penicillins Nausea And Vomiting    Family History  Problem Relation Age of Onset  . Heart attack Mother   . Heart failure Mother   . Stomach cancer Father      Prior to Admission medications   Medication Sig Start Date End Date Taking? Authorizing Provider  aspirin 81 MG EC tablet Take 81 mg by mouth daily. 04/07/12  Yes Clent Demark, MD  atorvastatin (LIPITOR) 20 MG tablet Take 20 mg by mouth daily.    Yes Historical Provider, MD  calcium-vitamin D (OSCAL WITH D) 500-200 MG-UNIT per tablet Take 1 tablet by mouth daily with breakfast.   Yes Historical Provider, MD  carvedilol (COREG) 3.125 MG tablet Take 3.125 mg by mouth 2 (two) times daily with a meal. 04/20/13  Yes Nita Sells, MD  clopidogrel (PLAVIX) 75 MG tablet Take 75 mg by mouth daily.   Yes Historical Provider, MD  ferrous sulfate 325 (65 FE) MG tablet Take 325 mg by mouth 3  (three) times daily with meals. 04/07/12  Yes Clent Demark, MD  FLUoxetine (PROZAC) 10 MG capsule Take 10 mg by mouth daily.   Yes Historical Provider, MD  lisinopril (PRINIVIL,ZESTRIL) 5 MG tablet Take 5 mg by mouth daily.    Yes Historical Provider, MD  metFORMIN (GLUCOPHAGE) 500 MG tablet Take 500 mg by mouth 2 (two) times daily with a meal.   Yes Historical Provider, MD  pantoprazole (PROTONIX) 40 MG tablet Take 40 mg by mouth daily.   Yes Historical Provider, MD  nitroGLYCERIN (NITROSTAT) 0.4 MG SL tablet Place 0.4 mg under the tongue every 5 (five) minutes x 3 doses as needed for chest pain. 04/07/12   Clent Demark, MD   Physical Exam: Filed Vitals:   06/15/13 1500  BP: 94/62  Pulse: 99  Temp:   Resp: 24    BP 94/62  Pulse 99  Temp(Src) 97.9 F (36.6 C) (Oral)  Resp 24  SpO2 100%  General:  Appears calm and comfortable Eyes: PERRL, normal lids, irises & conjunctiva ENT: grossly normal hearing, , mucous membranes are dry  Neck: no LAD, masses or thyromegaly Cardiovascular: RRR, no m/r/g. No LE edema. Telemetry: SR, no arrhythmias  Respiratory: CTA bilaterally, no w/r/r. Normal respiratory effort. Abdomen: soft, ntnd Musculoskeletal: grossly normal tone BUE/BLE Psychiatric: grossly normal mood and affect, speech fluent and appropriate          Labs on Admission:  Basic Metabolic Panel:  Recent Labs Lab 06/15/13 1215 06/15/13 1348  NA 137 140  K 6.5* 6.6*  CL 105 110  CO2 16* 16*  GLUCOSE 97 86  BUN 57* 55*  CREATININE 3.58* 3.49*  CALCIUM 9.9 9.1   Liver Function Tests: No results found for this basename: AST, ALT, ALKPHOS, BILITOT, PROT, ALBUMIN,  in the last 168 hours No results found for this basename: LIPASE, AMYLASE,  in the last 168 hours No results found for this basename: AMMONIA,  in the last 168 hours CBC:  Recent Labs Lab 06/15/13 1215  WBC 5.4  NEUTROABS 2.7  HGB 10.1*  HCT 31.5*  MCV 89.0  PLT 258   Cardiac Enzymes: No results  found for this basename: CKTOTAL, CKMB, CKMBINDEX, TROPONINI,  in the last 168 hours  BNP (last 3 results)  Recent Labs  01/22/13 1850 01/23/13 0230 04/24/13 1152  PROBNP 5790.0* 6000.0* 736.4*   CBG: No results found for this basename: GLUCAP,  in the last 168 hours  Radiological Exams on Admission: Dg Chest 2 View  06/15/2013   CLINICAL DATA:  Low blood pressure.  Weakness.  Hypotension.  EXAM: CHEST  2 VIEW  COMPARISON:  Multiple priors dating back to 03/02/2011.  FINDINGS: Cardiopericardial silhouette is within normal limits. No airspace disease or pleural effusion. Mild right basilar atelectasis. Aortic arch atherosclerosis. Nodular density is present over the right anterior first rib end, which is a chronic finding representing costochondral calcification. This  is stable dating back to 2013.  IMPRESSION: No active cardiopulmonary disease.   Electronically Signed   By: Dereck Ligas M.D.   On: 06/15/2013 13:25    EKG: Independently reviewed. Sinus tachycardia no ST segment abnormalities.  Assessment/Plan ARF (acute renal failure): - This is most likely prerenal and ACE inhibitor use in etiology. - Her husband and her denied any diarrhea, she is on lisinopril and found to be hypertensive on admission. I will go ahead and check a urinary creatinine and urinary sodium, start on IV fluids and check a basic metabolic panel in the morning.  Hypotension: - She's currently on Coreg and lisinopril, I will hold these. - Continue her on IV fluids follow strict I.'s and nose. She has remained afebrile with no leukocytosis. Denies any alarming symptoms. - Check orthostatics on admission.  Hyperkalemia - Most likely due to decreased intravascular volume and ongoing ACE inhibitor. - Check a be met tonight, DTRs are to given her Kayexalate, glucose, insulin, bicarbonate and calcium gluconate EKG shows no dynamic changes.  Chronic diastolic CHF (congestive heart failure) - Seems to be  compensated. She's currently not a diuretic. - Hold her beta blocker (Coreg) 2 to hypotension.  Protein-calorie malnutrition, severe - Ensure 3 times a day  Diabetes mellitus: - Her last hemoglobin A1c was less than 6, DC her metformin. - Continue management with diet.  Code Status: full Family Communication: husband Disposition Plan: inpatient  Time spent: 34 minutes  Charlynne Cousins [facility] Pager [phone]  **Disclaimer: This note may have been dictated with voice recognition software. Similar sounding words can inadvertently be transcribed and this note may contain transcription errors which may not have been corrected upon publication of note.**

## 2013-06-15 NOTE — ED Notes (Signed)
Dr. Micheline Maze informed of pt's potassium

## 2013-06-15 NOTE — ED Notes (Deleted)
Dr. Docherty informed of pt's potassium 

## 2013-06-15 NOTE — ED Provider Notes (Signed)
CSN: [id number]     Arrival date & time 06/15/13  1133 History   First MD Initiated Contact with Patient 06/15/13 1140     Chief Complaint  Patient presents with  . Follow-up    Blood pressure  . Hypotension     (Consider location/radiation/quality/duration/timing/severity/associated sxs/prior Treatment) HPI Comments: Pt's home health nurse took BP this morning and it was low. Husband unsure of the number. PCP was told and Pt was referred to the ED. Pt reports she feels "ok". Has had chronically poor appetite, chronic unchanged SOB. She also complained of ab pain today, but has had no fever, n/v, d/a, urinary symptoms, cough.  The history is provided by the patient and the spouse. No language interpreter was used.    Past Medical History  Diagnosis Date  . Myocardial infarction 04/2012  . DVT (deep venous thrombosis)     "?RLE; was on coumadin f"or 3 yrs"  . Pneumonia     "3 times" (01/19/2013)  . Exertional asthma   . Type II diabetes mellitus   . GERD (gastroesophageal reflux disease)   . Headache(784.0)     "q other week" (01/19/2013)  . Arthritis     "knees" (01/19/2013)  . Stroke 2005    R sided deficits  . Chronic diastolic CHF (congestive heart failure)   . Coronary artery disease    Past Surgical History  Procedure Laterality Date  . Tubal ligation    . Cataract extraction extracapsular Left   . Dilation and curettage of uterus    . Coronary angioplasty with stent placement  04/2012  . Coronary angioplasty  04/2012   Family History  Problem Relation Age of Onset  . Heart attack Mother   . Heart failure Mother   . Stomach cancer Father    History  Substance Use Topics  . Smoking status: Former Smoker -- 0.50 packs/day for 15 years  . Smokeless tobacco: Never Used  . Alcohol Use: No   OB History   Grav Para Term Preterm Abortions TAB SAB Ect Mult Living                 Review of Systems  Constitutional: Positive for appetite change and fatigue. Negative  for fever, chills and diaphoresis.  HENT: Negative for congestion, facial swelling, rhinorrhea and sore throat.   Eyes: Negative for photophobia and discharge.  Respiratory: Negative for cough, chest tightness and shortness of breath.   Cardiovascular: Negative for chest pain, palpitations and leg swelling.  Gastrointestinal: Positive for abdominal pain. Negative for nausea, vomiting and diarrhea.  Endocrine: Negative for polydipsia and polyuria.  Genitourinary: Negative for dysuria, frequency, difficulty urinating and pelvic pain.  Musculoskeletal: Negative for arthralgias, back pain, neck pain and neck stiffness.  Skin: Negative for color change and wound.  Allergic/Immunologic: Negative for immunocompromised state.  Neurological: Negative for facial asymmetry, weakness, numbness and headaches.  Hematological: Does not bruise/bleed easily.  Psychiatric/Behavioral: Negative for confusion and agitation.      Allergies  Penicillins  Home Medications   Prior to Admission medications   Medication Sig Start Date End Date Taking? Authorizing Provider  aspirin 81 MG EC tablet Take 81 mg by mouth daily. 04/07/12  Yes Robynn Pane, MD  atorvastatin (LIPITOR) 20 MG tablet Take 20 mg by mouth daily.    Yes Historical Provider, MD  calcium-vitamin D (OSCAL WITH D) 500-200 MG-UNIT per tablet Take 1 tablet by mouth daily with breakfast.   Yes Historical Provider, MD  carvedilol (COREG)  3.125 MG tablet Take 3.125 mg by mouth 2 (two) times daily with a meal. 04/20/13  Yes Rhetta MuraJai-Gurmukh Samtani, MD  clopidogrel (PLAVIX) 75 MG tablet Take 75 mg by mouth daily.   Yes Historical Provider, MD  ferrous sulfate 325 (65 FE) MG tablet Take 325 mg by mouth 3 (three) times daily with meals. 04/07/12  Yes Robynn PaneMohan N Harwani, MD  FLUoxetine (PROZAC) 10 MG capsule Take 10 mg by mouth daily.   Yes Historical Provider, MD  lisinopril (PRINIVIL,ZESTRIL) 5 MG tablet Take 5 mg by mouth daily.    Yes Historical Provider, MD   metFORMIN (GLUCOPHAGE) 500 MG tablet Take 500 mg by mouth 2 (two) times daily with a meal.   Yes Historical Provider, MD  pantoprazole (PROTONIX) 40 MG tablet Take 40 mg by mouth daily.   Yes Historical Provider, MD  nitroGLYCERIN (NITROSTAT) 0.4 MG SL tablet Place 0.4 mg under the tongue every 5 (five) minutes x 3 doses as needed for chest pain. 04/07/12   Robynn PaneMohan N Harwani, MD   BP 107/58  Pulse 97  Temp(Src) 97.9 F (36.6 C) (Oral)  Resp 21  SpO2 100% Physical Exam  Constitutional: She is oriented to person, place, and time. She appears well-developed and well-nourished. No distress.  HENT:  Head: Normocephalic and atraumatic.  Mouth/Throat: No oropharyngeal exudate.  Eyes: Pupils are equal, round, and reactive to light.  Neck: Normal range of motion. Neck supple.  Cardiovascular: Normal rate, regular rhythm and normal heart sounds.  Exam reveals no gallop and no friction rub.   No murmur heard. Pulmonary/Chest: Effort normal and breath sounds normal. No respiratory distress. She has no wheezes. She has no rales.  Abdominal: Soft. Bowel sounds are normal. She exhibits no distension and no mass. There is no tenderness. There is no rebound and no guarding.  Denies abdominal pain on exam  Musculoskeletal: Normal range of motion. She exhibits no edema and no tenderness.  RUE contracture  Neurological: She is alert and oriented to person, place, and time.  Speech difficulty to understand, but baseline  Skin: Skin is warm and dry.  Psychiatric: She has a normal mood and affect.    ED Course  Procedures (including critical care time) Labs Review Labs Reviewed  CBC WITH DIFFERENTIAL - Abnormal; Notable for the following:    RBC 3.54 (*)    Hemoglobin 10.1 (*)    HCT 31.5 (*)    All other components within normal limits  BASIC METABOLIC PANEL - Abnormal; Notable for the following:    Potassium 6.5 (*)    CO2 16 (*)    BUN 57 (*)    Creatinine, Ser 3.58 (*)    GFR calc non Af Amer  12 (*)    GFR calc Af Amer 14 (*)    All other components within normal limits  BASIC METABOLIC PANEL - Abnormal; Notable for the following:    Potassium 6.6 (*)    CO2 16 (*)    BUN 55 (*)    Creatinine, Ser 3.49 (*)    GFR calc non Af Amer 12 (*)    GFR calc Af Amer 14 (*)    All other components within normal limits  URINE CULTURE  URINALYSIS, ROUTINE W REFLEX MICROSCOPIC  GLUCOSE, RANDOM    Imaging Review Dg Chest 2 View  06/15/2013   CLINICAL DATA:  Low blood pressure.  Weakness.  Hypotension.  EXAM: CHEST  2 VIEW  COMPARISON:  Multiple priors dating back to 03/02/2011.  FINDINGS: Cardiopericardial silhouette is within normal limits. No airspace disease or pleural effusion. Mild right basilar atelectasis. Aortic arch atherosclerosis. Nodular density is present over the right anterior first rib end, which is a chronic finding representing costochondral calcification. This is stable dating back to 2013.  IMPRESSION: No active cardiopulmonary disease.   Electronically Signed   By: Andreas Newport M.D.   On: 06/15/2013 13:25     EKG Interpretation   Date/Time:  Thursday June 15 2013 12:16:25 EDT Ventricular Rate:  102 PR Interval:  128 QRS Duration: 75 QT Interval:  319 QTC Calculation: 415 R Axis:   -4 Text Interpretation:  Sinus tachycardia Anterior infarct, old Artifact  Confirmed by Austine Wiedeman  MD, Cashmere Harmes [phone]) on 06/15/2013 12:20:20 PM      MDM   Final diagnoses:  ARF (acute renal failure)  Hyperkalemia  Hypotension    Pt is a 68 y.o. female with Pmhx as above who presents with hypotension from home after BP taken by who I presume is home health this morning. Pt's PCP called and was told to go to the ED. Pt reports she feels "ok". Has had chronically poor appetite, chronic unchanged SOB. She also complained of ab pain today, but has had no fever, n/v, d/a, urinary symptoms, cough. Cardiopulm & abdominal exam are benign. She had admission on 4/20 for hypotension which  was thought to be due to unintentional beta blocker OD.   Cr worse from about 2.5 to 3.49, and K elevated 6.6. No EKG changes. Confirmed w/ repeat labs. BP improved w/ gentle hydration, but given ARF w/ hyperkalemia, will consulted medicine for eval. Suspect dehydration is cause of hyperkalemia. Tx initiated w/ insulin, dextrose, albuterol, CaGlu, kayexelate, IVF. [facility] consulted for admission.       Shanna Cisco, MD 06/15/13 [phone]

## 2013-06-15 NOTE — ED Notes (Signed)
MD at bedside. 

## 2013-06-16 DIAGNOSIS — E1129 Type 2 diabetes mellitus with other diabetic kidney complication: Secondary | ICD-10-CM

## 2013-06-16 DIAGNOSIS — N184 Chronic kidney disease, stage 4 (severe): Secondary | ICD-10-CM | POA: Insufficient documentation

## 2013-06-16 LAB — CBC
HEMATOCRIT: 27.8 % — AB (ref 36.0–46.0)
HEMOGLOBIN: 9 g/dL — AB (ref 12.0–15.0)
MCH: 29.3 pg (ref 26.0–34.0)
MCHC: 32.4 g/dL (ref 30.0–36.0)
MCV: 90.6 fL (ref 78.0–100.0)
Platelets: 202 10*3/uL (ref 150–400)
RBC: 3.07 MIL/uL — ABNORMAL LOW (ref 3.87–5.11)
RDW: 15.8 % — ABNORMAL HIGH (ref 11.5–15.5)
WBC: 4.2 10*3/uL (ref 4.0–10.5)

## 2013-06-16 LAB — BASIC METABOLIC PANEL
BUN: 47 mg/dL — ABNORMAL HIGH (ref 6–23)
CHLORIDE: 112 meq/L (ref 96–112)
CO2: 18 mEq/L — ABNORMAL LOW (ref 19–32)
Calcium: 9 mg/dL (ref 8.4–10.5)
Creatinine, Ser: 3.24 mg/dL — ABNORMAL HIGH (ref 0.50–1.10)
GFR calc non Af Amer: 14 mL/min — ABNORMAL LOW (ref >90)
GFR, EST AFRICAN AMERICAN: 16 mL/min — AB (ref >90)
GLUCOSE: 84 mg/dL (ref 70–99)
POTASSIUM: 5.5 meq/L — AB (ref 3.7–5.3)
Sodium: 141 mEq/L (ref 137–147)

## 2013-06-16 LAB — GLUCOSE, CAPILLARY
GLUCOSE-CAPILLARY: 78 mg/dL (ref 70–99)
GLUCOSE-CAPILLARY: 106 mg/dL — AB (ref 70–99)
Glucose-Capillary: 100 mg/dL — ABNORMAL HIGH (ref 70–99)
Glucose-Capillary: 107 mg/dL — ABNORMAL HIGH (ref 70–99)
Glucose-Capillary: 160 mg/dL — ABNORMAL HIGH (ref 70–99)

## 2013-06-16 LAB — URINE CULTURE
Colony Count: NO GROWTH
Culture: NO GROWTH

## 2013-06-16 MED ORDER — ENSURE PUDDING PO PUDG: 1.0000 | Freq: Three times a day (TID) | ORAL | Status: DC | PRN

## 2013-06-16 MED ORDER — SODIUM CHLORIDE 0.9 % IV SOLN
INTRAVENOUS | Status: DC
  Administered 2013-06-16 – 2013-06-17 (×2): via INTRAVENOUS
  Administered 2013-06-17: 75 mL/h via INTRAVENOUS
  Administered 2013-06-17: 100 mL/h via INTRAVENOUS

## 2013-06-16 MED ORDER — ENSURE COMPLETE PO LIQD
237.0000 mL | Freq: Two times a day (BID) | ORAL | Status: DC
  Administered 2013-06-16 – 2013-06-18 (×5): 237 mL via ORAL

## 2013-06-16 NOTE — Progress Notes (Signed)
[facility] PROGRESS NOTE  [name] YOV:[id number] DOB: 1945-04-02 DOA: 06/15/2013 PCP: Marlou Sa [name], MD  Assessment/Plan: Principal Problem:   Acute on chronic renal failure: Baseline around 2.5. Likely prerenal azotemia Have started gentle IV fluids. Unclear etiology in patient is not great historian. Active Problems:   Chronic diastolic CHF (congestive heart failure): Stable for now.    DM type 2 causing renal disease: Monitoring blood sugars.    Hyperkalemia: Secondary to renal failure. Status post 1 dose of Kayexalate. Still elevated this morning, however much improved. Continue to monitor.    Protein-calorie malnutrition, severe: The last time patient was here, she met criteria for severe MALNUTRITION in the context of chronic illness as evidenced by <75% estimated energy intake in the past month with 26.5% weight loss in the past year.    Hypotension secondary to prerenal azotemia.    Code Status: Full code Family Communication: Left message with husband and he Disposition Plan: Hospital for several days until renal function back to normal   Consultants:  None  Procedures:  None  Antibiotics:  None  HPI/Subjective: Patient doing okay. Complains of feeling very weak. Denies any pain.  Objective: Filed Vitals:   06/16/13 0938  BP: 100/65  Pulse: 107  Temp: 98.6 F (37 C)  Resp: 16    Intake/Output Summary (Last 24 hours) at 06/16/13 1012 Last data filed at 06/16/13 0900  Gross per 24 hour  Intake    243 ml  Output    200 ml  Net     43 ml   Filed Weights   06/15/13 1618 06/15/13 2008  Weight: 45.632 kg (100 lb 9.6 oz) 46.222 kg (101 lb 14.4 oz)    Exam:   General:  Alert and oriented x2, no acute distress  Cardiovascular: Regular rate and rhythm, X4-J2, 2/6 systolic ejection murmur  Respiratory: Clear to auscultation bilaterally  Abdomen: Soft, nontender, nondistended, positive bowel sounds  Musculoskeletal: No clubbing or cyanosis or  edema   Data Reviewed: Basic Metabolic Panel:  Recent Labs Lab 06/15/13 1215 06/15/13 1348 06/16/13 0704  NA 137 140 141  K 6.5* 6.6* 5.5*  CL 105 110 112  CO2 16* 16* 18*  GLUCOSE 97 86 84  BUN 57* 55* 47*  CREATININE 3.58* 3.49* 3.24*  CALCIUM 9.9 9.1 9.0   Liver Function Tests: No results found for this basename: AST, ALT, ALKPHOS, BILITOT, PROT, ALBUMIN,  in the last 168 hours No results found for this basename: LIPASE, AMYLASE,  in the last 168 hours No results found for this basename: AMMONIA,  in the last 168 hours CBC:  Recent Labs Lab 06/15/13 1215 06/16/13 0704  WBC 5.4 4.2  NEUTROABS 2.7  --   HGB 10.1* 9.0*  HCT 31.5* 27.8*  MCV 89.0 90.6  PLT 258 202   Cardiac Enzymes: No results found for this basename: CKTOTAL, CKMB, CKMBINDEX, TROPONINI,  in the last 168 hours BNP (last 3 results)  Recent Labs  01/22/13 1850 01/23/13 0230 04/24/13 1152  PROBNP 5790.0* 6000.0* 736.4*   CBG:  Recent Labs Lab 06/15/13 1631 06/15/13 2141 06/16/13 0743  GLUCAP 142* 100* 78    No results found for this or any previous visit (from the past 240 hour(s)).   Studies: Dg Chest 2 View  06/15/2013   CLINICAL DATA:  Low blood pressure.  Weakness.  Hypotension.  EXAM: CHEST  2 VIEW  COMPARISON:  Multiple priors dating back to 03/02/2011.  FINDINGS: Cardiopericardial silhouette is within normal limits. No  airspace disease or pleural effusion. Mild right basilar atelectasis. Aortic arch atherosclerosis. Nodular density is present over the right anterior first rib end, which is a chronic finding representing costochondral calcification. This is stable dating back to 2013.  IMPRESSION: No active cardiopulmonary disease.   Electronically Signed   By: Dereck Ligas M.D.   On: 06/15/2013 13:25    Scheduled Meds: . sodium chloride   Intravenous STAT  . aspirin  81 mg Oral Daily  . atorvastatin  20 mg Oral Daily  . calcium-vitamin D  1 tablet Oral Q breakfast  .  clopidogrel  75 mg Oral Q breakfast  . ferrous sulfate  325 mg Oral TID WC  . FLUoxetine  10 mg Oral Daily  . heparin  5,000 Units Subcutaneous 3 times per day  . insulin aspart  0-9 Units Subcutaneous TID WC  . pantoprazole  40 mg Oral Daily  . sodium chloride  3 mL Intravenous Q12H   Continuous Infusions: . sodium chloride 100 mL/hr at 06/16/13 2224    Principal Problem:   Acute on chronic renal failure Active Problems:   Chronic diastolic CHF (congestive heart failure)   DM type 2 causing renal disease   Hyperkalemia   Protein-calorie malnutrition, severe   Hypotension    Time spent: 15 min    [facility] Pager [phone]. If 7PM-7AM, please contact night-coverage at [email or web address], password [facility] 06/16/2013, 10:12 AM  LOS: 1 day

## 2013-06-16 NOTE — Progress Notes (Signed)
INITIAL NUTRITION ASSESSMENT  DOCUMENTATION CODES Per approved criteria  -Severe malnutrition in the context of chronic illness   INTERVENTION: Add Ensure Complete po BID, each supplement provides 350 kcal and 13 grams of protein. Add Ensure Pudding po TID, each supplement provides 170 kcal and 4 grams of protein. Downgrade to Dysphagia 3 diet with Heart Healthy diet restrictions. RD to continue to follow nutrition care plan.  NUTRITION DIAGNOSIS: Inadequate oral intake related to poor appetite as evidenced by ongoing weight loss and dietary recall.   Goal: Intake to meet >90% of estimated nutrition needs.  Monitor:  weight trends, lab trends, I/O's, PO intake, supplement tolerance  Reason for Assessment: Malnutrition Screening Tool + MD Consult for Assessment  68 y.o. female  Admitting Dx: Acute on chronic renal failure  ASSESSMENT: PMHx significant for noncompliance, renal disease, DM. Admitted with generalized weakness x 3-4 days. Work-up reveals hypotension.  Patient currently ordered for Heart Healthy diet and is attempting to eat a salad. Pt only has top teeth and cannot chew lettuce. Is able to tolerate the toppings on her salad and her side items. Will downgrade diet.  Pt continues to lose weight and has lost a total of 16% x 6 months and 30% x 1 year. Family at bedside confirm that patient doesn't eat well at home, but will drink about 2 Ensures daily.  CBG's: 78-107 Potassium elevated at 5.5, but trending down --> ordered for Kayexalate  Pt meets criteria for severe MALNUTRITION in the context of chronic illness as evidenced by 30% wt loss x 1 year and intake of <75% x at least 1 month.   Height: Ht Readings from Last 1 Encounters:  06/15/13 5\' 4"  (1.626 m)    Weight: Wt Readings from Last 1 Encounters:  06/15/13 101 lb 14.4 oz (46.222 kg)    Ideal Body Weight: 120 lb  % Ideal Body Weight: 84%  Wt Readings from Last 10 Encounters:  06/15/13 101 lb 14.4  oz (46.222 kg)  04/25/13 106 lb 4.2 oz (48.2 kg)  04/16/13 108 lb 3.9 oz (49.1 kg)  02/02/13 120 lb 12.8 oz (54.795 kg)  01/27/13 124 lb 4.8 oz (56.382 kg)  01/22/13 128 lb 12 oz (58.4 kg)  04/05/12 147 lb 11.3 oz (67 kg)  04/05/12 147 lb 11.3 oz (67 kg)  03/17/12 140 lb (63.504 kg)  03/17/12 140 lb (63.504 kg)    Usual Body Weight: 120 lb in January 2015  % Usual Body Weight: 84%  BMI:  Body mass index is 17.48 kg/(m^2). Underweight  Estimated Nutritional Needs: Kcal: 1500 - 1650 Protein: 55 - 65 g Fluid: 1.5 liters daily  Skin: intact  Diet Order: Cardiac  EDUCATION NEEDS: -No education needs identified at this time   Intake/Output Summary (Last 24 hours) at 06/16/13 1214 Last data filed at 06/16/13 0900  Gross per 24 hour  Intake    243 ml  Output    200 ml  Net     43 ml    Last BM: 6/10  Labs:   Recent Labs Lab 06/15/13 1215 06/15/13 1348 06/16/13 0704  NA 137 140 141  K 6.5* 6.6* 5.5*  CL 105 110 112  CO2 16* 16* 18*  BUN 57* 55* 47*  CREATININE 3.58* 3.49* 3.24*  CALCIUM 9.9 9.1 9.0  GLUCOSE 97 86 84    CBG (last 3)   Recent Labs  06/15/13 1631 06/15/13 2141 06/16/13 0743  GLUCAP 142* 100* 78    Scheduled Meds: .  sodium chloride   Intravenous STAT  . aspirin  81 mg Oral Daily  . atorvastatin  20 mg Oral Daily  . calcium-vitamin D  1 tablet Oral Q breakfast  . clopidogrel  75 mg Oral Q breakfast  . ferrous sulfate  325 mg Oral TID WC  . FLUoxetine  10 mg Oral Daily  . heparin  5,000 Units Subcutaneous 3 times per day  . insulin aspart  0-9 Units Subcutaneous TID WC  . pantoprazole  40 mg Oral Daily  . sodium chloride  3 mL Intravenous Q12H    Continuous Infusions: . sodium chloride 100 mL/hr at 06/16/13 [id number]    Past Medical History  Diagnosis Date  . Myocardial infarction 04/2012  . DVT (deep venous thrombosis)     "?RLE; was on coumadin f"or 3 yrs"  . Pneumonia     "3 times" (01/19/2013)  . Exertional asthma   . Type  II diabetes mellitus   . GERD (gastroesophageal reflux disease)   . Headache(784.0)     "q other week" (01/19/2013)  . Arthritis     "knees" (01/19/2013)  . Stroke 2005    R sided deficits  . Chronic diastolic CHF (congestive heart failure)   . Coronary artery disease     Past Surgical History  Procedure Laterality Date  . Tubal ligation    . Cataract extraction extracapsular Left   . Dilation and curettage of uterus    . Coronary angioplasty with stent placement  04/2012  . Coronary angioplasty  04/2012    Jarold MottoSamantha Alvena Kiernan MS, RD, LDN Inpatient Registered Dietitian Pager: [phone] After-hours pager: [phone]

## 2013-06-16 NOTE — Progress Notes (Signed)
Utilization review completed. Malayiah Mcbrayer, RN, BSN. 

## 2013-06-17 DIAGNOSIS — D649 Anemia, unspecified: Secondary | ICD-10-CM

## 2013-06-17 LAB — CBC
HCT: 26.8 % — ABNORMAL LOW (ref 36.0–46.0)
Hemoglobin: 8.6 g/dL — ABNORMAL LOW (ref 12.0–15.0)
MCH: 28.8 pg (ref 26.0–34.0)
MCHC: 32.1 g/dL (ref 30.0–36.0)
MCV: 89.6 fL (ref 78.0–100.0)
Platelets: 210 10*3/uL (ref 150–400)
RBC: 2.99 MIL/uL — AB (ref 3.87–5.11)
RDW: 15.8 % — ABNORMAL HIGH (ref 11.5–15.5)
WBC: 5 10*3/uL (ref 4.0–10.5)

## 2013-06-17 LAB — BASIC METABOLIC PANEL
BUN: 37 mg/dL — ABNORMAL HIGH (ref 6–23)
CO2: 15 meq/L — AB (ref 19–32)
Calcium: 8.6 mg/dL (ref 8.4–10.5)
Chloride: 112 mEq/L (ref 96–112)
Creatinine, Ser: 2.83 mg/dL — ABNORMAL HIGH (ref 0.50–1.10)
GFR calc Af Amer: 18 mL/min — ABNORMAL LOW (ref >90)
GFR calc non Af Amer: 16 mL/min — ABNORMAL LOW (ref >90)
Glucose, Bld: 79 mg/dL (ref 70–99)
Potassium: 4.8 mEq/L (ref 3.7–5.3)
Sodium: 142 mEq/L (ref 137–147)

## 2013-06-17 LAB — OCCULT BLOOD X 1 CARD TO LAB, STOOL: FECAL OCCULT BLD: NEGATIVE

## 2013-06-17 LAB — GLUCOSE, CAPILLARY
GLUCOSE-CAPILLARY: 90 mg/dL (ref 70–99)
Glucose-Capillary: 67 mg/dL — ABNORMAL LOW (ref 70–99)
Glucose-Capillary: 111 mg/dL — ABNORMAL HIGH (ref 70–99)
Glucose-Capillary: 83 mg/dL (ref 70–99)
Glucose-Capillary: 84 mg/dL (ref 70–99)

## 2013-06-17 NOTE — Progress Notes (Signed)
[facility] PROGRESS NOTE  [name] ZOX:[id number] DOB: [birth year] DOA: 06/15/2013 PCP: August SaucerEAN, [name], MD  Assessment/Plan: Principal Problem:   Acute on chronic renal failure: Baseline around 2.5. Likely prerenal azotemia Have started gentle IV fluids. Unclear etiology in patient is not great historian.  Noted low hemoglobin, which is lower than her baseline. Check Hemoccults. Follow H&H. Creatinine continues to improve. Active Problems:   Chronic diastolic CHF (congestive heart failure): Stable for now. Gently hydrating.    DM type 2 causing renal disease: Monitoring blood sugars. Sugars stable.    Hyperkalemia: Secondary to renal failure. Status post 1 dose of Kayexalate. Within normal range now.    Protein-calorie malnutrition, severe: Patient meets criteria for severe MALNUTRITION in the context of chronic illness as evidenced by <75% estimated energy intake in the past month with 26.5% weight loss in the past year.    Hypotension secondary to prerenal azotemia. Improved  Anemia: Underlying from chronic disease.  Code Status: Full code Family Communication: Left message with husband today. Spoke with him at the bedside yesterday. Disposition Plan: Home likely tomorrow. Will ambulate.   Consultants:  None  Procedures:  None  Antibiotics:  None  HPI/Subjective: Patient doing okay. No complaints. No shortness of breath.  Objective: Filed Vitals:   06/17/13 0900  BP: 113/66  Pulse: 98  Temp: 97.7 F (36.5 C)  Resp: 18    Intake/Output Summary (Last 24 hours) at 06/17/13 1050 Last data filed at 06/17/13 0900  Gross per 24 hour  Intake 2408.33 ml  Output    300 ml  Net 2108.33 ml   Filed Weights   06/15/13 1618 06/15/13 2008 06/16/13 2020  Weight: 45.632 kg (100 lb 9.6 oz) 46.222 kg (101 lb 14.4 oz) 46.222 kg (101 lb 14.4 oz)    Exam:   General:  Alert and oriented x2, no acute distress  Cardiovascular: Regular rate and rhythm, S1-S2, 2/6 systolic  ejection murmur  Respiratory: Clear to auscultation bilaterally  Abdomen: Soft, nontender, nondistended, positive bowel sounds  Musculoskeletal: No clubbing or cyanosis or edema   Data Reviewed: Basic Metabolic Panel:  Recent Labs Lab 06/15/13 1215 06/15/13 1348 06/16/13 0704 06/17/13 0441  NA 137 140 141 142  K 6.5* 6.6* 5.5* 4.8  CL 105 110 112 112  CO2 16* 16* 18* 15*  GLUCOSE 97 86 84 79  BUN 57* 55* 47* 37*  CREATININE 3.58* 3.49* 3.24* 2.83*  CALCIUM 9.9 9.1 9.0 8.6   Liver Function Tests: No results found for this basename: AST, ALT, ALKPHOS, BILITOT, PROT, ALBUMIN,  in the last 168 hours No results found for this basename: LIPASE, AMYLASE,  in the last 168 hours No results found for this basename: AMMONIA,  in the last 168 hours CBC:  Recent Labs Lab 06/15/13 1215 06/16/13 0704 06/17/13 0441  WBC 5.4 4.2 5.0  NEUTROABS 2.7  --   --   HGB 10.1* 9.0* 8.6*  HCT 31.5* 27.8* 26.8*  MCV 89.0 90.6 89.6  PLT 258 202 210   Cardiac Enzymes: No results found for this basename: CKTOTAL, CKMB, CKMBINDEX, TROPONINI,  in the last 168 hours BNP (last 3 results)  Recent Labs  01/22/13 1850 01/23/13 0230 04/24/13 1152  PROBNP 5790.0* 6000.0* 736.4*   CBG:  Recent Labs Lab 06/16/13 0743 06/16/13 1158 06/16/13 1727 06/16/13 2018 06/17/13 0816  GLUCAP 78 107* 106* 160* 67*    Recent Results (from the past 240 hour(s))  URINE CULTURE     Status: None  Collection Time    06/15/13  1:39 PM      Result Value Ref Range Status   Specimen Description URINE, CATHETERIZED   Final   Special Requests NONE   Final   Culture  Setup Time     Final   Value: 06/15/2013 18:51     [facility]   Colony Count     Final   Value: NO GROWTH     [facility]formed at [facility]   Culture     Final   Value: NO GROWTH     Performed at [facility]   Report Status 06/16/2013 FINAL   Final     Studies: Dg Chest 2 View  06/15/2013    CLINICAL DATA:  Low blood pressure.  Weakness.  Hypotension.  EXAM: CHEST  2 VIEW  COMPARISON:  Multiple priors dating back to 03/02/2011.  FINDINGS: Cardiopericardial silhouette is within normal limits. No airspace disease or pleural effusion. Mild right basilar atelectasis. Aortic arch atherosclerosis. Nodular density is present over the right anterior first rib end, which is a chronic finding representing costochondral calcification. This is stable dating back to 2013.  IMPRESSION: No active cardiopulmonary disease.   Electronically Signed   By: Andreas NewportGeoffrey  Lamke M.D.   On: 06/15/2013 13:25    Scheduled Meds: . aspirin  81 mg Oral Daily  . atorvastatin  20 mg Oral Daily  . calcium-vitamin D  1 tablet Oral Q breakfast  . clopidogrel  75 mg Oral Q breakfast  . feeding supplement (ENSURE COMPLETE)  237 mL Oral BID BM  . ferrous sulfate  325 mg Oral TID WC  . FLUoxetine  10 mg Oral Daily  . heparin  5,000 Units Subcutaneous 3 times per day  . insulin aspart  0-9 Units Subcutaneous TID WC  . pantoprazole  40 mg Oral Daily  . sodium chloride  3 mL Intravenous Q12H   Continuous Infusions: . sodium chloride 100 mL/hr (06/17/13 0115)    Principal Problem:   Acute on chronic renal failure Active Problems:   Chronic diastolic CHF (congestive heart failure)   DM type 2 causing renal disease   Hyperkalemia   Protein-calorie malnutrition, severe   Hypotension    Time spent: 15 min    Hollice EspyKRISHNAN,SENDIL K  [facility] Pager [phone]. If 7PM-7AM, please contact night-coverage at [email or web address], password [email or web address] 06/17/2013, 10:50 AM  LOS: 2 days

## 2013-06-17 NOTE — Progress Notes (Signed)
Tele d/c'd, Natalie CMT notified, tele box [phone] removed and returned to [place]. Will continue to monitor.

## 2013-06-18 LAB — CBC
HCT: 28.4 % — ABNORMAL LOW (ref 36.0–46.0)
HEMOGLOBIN: 9.3 g/dL — AB (ref 12.0–15.0)
MCH: 29.1 pg (ref 26.0–34.0)
MCHC: 32.7 g/dL (ref 30.0–36.0)
MCV: 88.8 fL (ref 78.0–100.0)
PLATELETS: 213 10*3/uL (ref 150–400)
RBC: 3.2 MIL/uL — AB (ref 3.87–5.11)
RDW: 15.7 % — ABNORMAL HIGH (ref 11.5–15.5)
WBC: 4.8 10*3/uL (ref 4.0–10.5)

## 2013-06-18 LAB — BASIC METABOLIC PANEL
BUN: 28 mg/dL — ABNORMAL HIGH (ref 6–23)
CALCIUM: 8.5 mg/dL (ref 8.4–10.5)
CO2: 17 meq/L — AB (ref 19–32)
Chloride: 112 mEq/L (ref 96–112)
Creatinine, Ser: 2.64 mg/dL — ABNORMAL HIGH (ref 0.50–1.10)
GFR calc Af Amer: 20 mL/min — ABNORMAL LOW (ref >90)
GFR calc non Af Amer: 17 mL/min — ABNORMAL LOW (ref >90)
GLUCOSE: 145 mg/dL — AB (ref 70–99)
POTASSIUM: 5.4 meq/L — AB (ref 3.7–5.3)
SODIUM: 142 meq/L (ref 137–147)

## 2013-06-18 LAB — GLUCOSE, CAPILLARY
GLUCOSE-CAPILLARY: 73 mg/dL (ref 70–99)
GLUCOSE-CAPILLARY: 112 mg/dL — AB (ref 70–99)

## 2013-06-18 MED ORDER — ENSURE COMPLETE PO LIQD: 237.0000 mL | Freq: Two times a day (BID) | ORAL | Status: DC

## 2013-06-18 NOTE — Progress Notes (Signed)
  Patient Discharge:  Disposition: home with husband  Education: educated patient and husband on discharge instructions, medications, and follow-up appointments.  Educated husband and patient on heart healthy diet and importance of hydration.  Educated on dehydration, signs and symptoms, and when to call a doctor.  Patient and husband verbalized understanding.  AVS signed.    IV: removed, clean dry and intact  Telemetry: previously discontinued   Follow-up appointments: Husband verbalized understanding of how to make a follow-up appointment.    Prescriptions:  Prescription for Ensure sent electronically to [facility]   Transportation:  Escorted via wheelchair with NT to ride  Belongings: gathered by husband, verified by NT and RN

## 2013-06-18 NOTE — Discharge Summary (Signed)
Physician Discharge Summary  Anastasia Pallda Jakes RUE:[id number] DOB: 1945/10/26 DOA: 06/15/2013  PCP: [name], [name], MD  Admit date: 06/15/2013 Discharge date: 06/18/2013  Time spent: 25 minutes  Recommendations for Outpatient Follow-up:  1. Medication change: Patient's metformin and lisinopril are going to be hold indefinitely until she follows up with her primary care physician. I'm concerned about her poor by mouth intake and that she came in with dehydration, hypotension and worsening renal failure so will put these medicines on hold. 2. Have added Ensure twice a day between meals to supplement nutrition  Discharge Diagnoses:  Principal Problem:   Acute on chronic renal failure Active Problems:   Anemia   Chronic diastolic CHF (congestive heart failure)   DM type 2 causing renal disease   Hyperkalemia   Protein-calorie malnutrition, severe   Hypotension   Discharge Condition: Improved, being discharged home  Diet recommendation: Low-sodium with Ensure supplements twice a day between meals  Filed Weights   06/15/13 2008 06/16/13 2020 06/17/13 2011  Weight: 46.222 kg (101 lb 14.4 oz) 46.222 kg (101 lb 14.4 oz) 47.356 kg (104 lb 6.4 oz)    History of present illness:  68 year old African American female with history of recurrent admissions to poor medication adherence plus chronic kidney disease with baseline creatinine of 2.6 presented to emergency room on 6/11 with complaints of poor by mouth intake and generalized weakness for several days and patient was found to be hypotensive. Both patient and her husband gave no history of cough, nausea, vomiting, fever or diarrhea. Patient found to be hypotensive with potassium of 6.6 and worsening renal failure with creatinine of 3.58. Patient admitted to the hospitalist service and started on IV fluids  Hospital Course:  Principal Problem:   Acute on chronic renal failure: Patient's antihypertensive medications held. She was gently hydrated and over  the next few days, creatinine continue to improve. By day of discharge, creatinine down to 2.64. Patient feeling much better and is going to be discharged home. Blood pressure stable a systolic in the 120s and heart rate in the high 80s. We'll restart her Coreg. Will stop for now her ACE inhibitor and leave it up to her primary care physician to restart. I'm concerned in suspect that this may have been playing contributing role Active Problems:   Anemia: Likely of chronic renal disease with hydration, hemoglobin dropped from 10.1 down to 8.6, but by day of discharge back up to 9.3 on its own. Hemoccult negative.    Chronic diastolic CHF (congestive heart failure): Overall stable. Patient was gently hydrated. Her weight is up 3 pounds from admission, however she was quite dehydrated at that point, so pretty close to dry weight.    DM type 2 causing renal disease: Last A1c was 5.6. Patient has been on sliding scale only and CBGs have not been elevated. Am concerned about resuming her metformin as she was slightly hypoglycemic on admission. Also with her degree of renal failure, concerned about issues. At this time we'll stop her metformin and have her follow up with her primary care physician. She'll discontinue diet controlled given erratic and poor by mouth intake.   Hyperkalemia: No T-wave inversions. Patient given one dose of Kayexalate on admission. Stable.    Protein-calorie malnutrition, severe: Patient meets criteria for severe malnutrition in the context of chronic illness as evidence by less than 75% estimated energy intake in the past month with 26.5% weight loss in the past year. Seen by nutrition. Patient started on Ensure shakes twice  a day between meals for supplementation.    Hypotension: Secondary dehydration. As above. Resolved.   Procedures:  None  Consultations:  Nutrition  Discharge Exam: Filed Vitals:   06/18/13 1100  BP: 122/68  Pulse: 94  Temp: 98.5 F (36.9 C)   Resp: 19    General: Alert and oriented, no acute distress Cardiovascular: Regular rate and rhythm, S1-S2, soft 2/6 systolic ejection murmur Respiratory: Clear to auscultation bilaterally  Discharge Instructions You were cared for by a hospitalist during your hospital stay. If you have any questions about your discharge medications or the care you received while you were in the hospital after you are discharged, you can call the unit and asked to speak with the hospitalist on call if the hospitalist that took care of you is not available. Once you are discharged, your primary care physician will handle any further medical issues. Please note that NO REFILLS for any discharge medications will be authorized once you are discharged, as it is imperative that you return to your primary care physician (or establish a relationship with a primary care physician if you do not have one) for your aftercare needs so that they can reassess your need for medications and monitor your lab values.  Discharge Instructions   Diet - low sodium heart healthy    Complete by:  As directed      Increase activity slowly    Complete by:  As directed             Medication List    STOP taking these medications       lisinopril 5 MG tablet  Commonly known as:  PRINIVIL,ZESTRIL     metFORMIN 500 MG tablet  Commonly known as:  GLUCOPHAGE      TAKE these medications       aspirin 81 MG EC tablet  Take 81 mg by mouth daily.     atorvastatin 20 MG tablet  Commonly known as:  LIPITOR  Take 20 mg by mouth daily.     calcium-vitamin D 500-200 MG-UNIT per tablet  Commonly known as:  OSCAL WITH D  Take 1 tablet by mouth daily with breakfast.     carvedilol 3.125 MG tablet  Commonly known as:  COREG  Take 3.125 mg by mouth 2 (two) times daily with a meal.     clopidogrel 75 MG tablet  Commonly known as:  PLAVIX  Take 75 mg by mouth daily.     feeding supplement (ENSURE COMPLETE) Liqd  Take 237 mLs by  mouth 2 (two) times daily between meals.     ferrous sulfate 325 (65 FE) MG tablet  Take 325 mg by mouth 3 (three) times daily with meals.     FLUoxetine 10 MG capsule  Commonly known as:  PROZAC  Take 10 mg by mouth daily.     nitroGLYCERIN 0.4 MG SL tablet  Commonly known as:  NITROSTAT  Place 0.4 mg under the tongue every 5 (five) minutes x 3 doses as needed for chest pain.     pantoprazole 40 MG tablet  Commonly known as:  PROTONIX  Take 40 mg by mouth daily.       Allergies  Allergen Reactions  . Penicillins Nausea And Vomiting       Follow-up Information   Follow up with August SaucerEAN, [name], MD In 2 weeks.   Specialty:  Internal Medicine   Contact information:   [facility] [address] [place] [address]  [phone]        The results of significant diagnostics from this hospitalization (including imaging, microbiology, ancillary and laboratory) are listed below for reference.    Significant Diagnostic Studies: Dg Chest 2 View  06/15/2013   CLINICAL DATA:  Low blood pressure.  Weakness.  Hypotension.  EXAM: CHEST  2 VIEW  COMPARISON:  Multiple priors dating back to 03/02/2011.  FINDINGS: Cardiopericardial silhouette is within normal limits. No airspace disease or pleural effusion. Mild right basilar atelectasis. Aortic arch atherosclerosis. Nodular density is present over the right anterior first rib end, which is a chronic finding representing costochondral calcification. This is stable dating back to 2013.  IMPRESSION: No active cardiopulmonary disease.   Electronically Signed   By: Andreas Newport M.D.   On: 06/15/2013 13:25    Microbiology: Recent Results (from the past 240 hour(s))  URINE CULTURE     Status: None   Collection Time    06/15/13  1:39 PM      Result Value Ref Range Status   Specimen Description URINE, CATHETERIZED   Final   Special Requests NONE   Final   Culture  Setup Time     Final   Value: 06/15/2013 18:51      Performed at [facility] Count     Final   Value: NO GROWTH     Performed at [facility]   Culture     Final   Value: NO GROWTH     Performed at [facility]   Report Status 06/16/2013 FINAL   Final     Labs: Basic Metabolic Panel:  Recent Labs Lab 06/15/13 1215 06/15/13 1348 06/16/13 0704 06/17/13 0441 06/18/13 1005  NA 137 140 141 142 142  K 6.5* 6.6* 5.5* 4.8 5.4*  CL 105 110 112 112 112  CO2 16* 16* 18* 15* 17*  GLUCOSE 97 86 84 79 145*  BUN 57* 55* 47* 37* 28*  CREATININE 3.58* 3.49* 3.24* 2.83* 2.64*  CALCIUM 9.9 9.1 9.0 8.6 8.5   Liver Function Tests: No results found for this basename: AST, ALT, ALKPHOS, BILITOT, PROT, ALBUMIN,  in the last 168 hours No results found for this basename: LIPASE, AMYLASE,  in the last 168 hours No results found for this basename: AMMONIA,  in the last 168 hours CBC:  Recent Labs Lab 06/15/13 1215 06/16/13 0704 06/17/13 0441 06/18/13 1005  WBC 5.4 4.2 5.0 4.8  NEUTROABS 2.7  --   --   --   HGB 10.1* 9.0* 8.6* 9.3*  HCT 31.5* 27.8* 26.8* 28.4*  MCV 89.0 90.6 89.6 88.8  PLT 258 202 210 213   Cardiac Enzymes: No results found for this basename: CKTOTAL, CKMB, CKMBINDEX, TROPONINI,  in the last 168 hours BNP: BNP (last 3 results)  Recent Labs  01/22/13 1850 01/23/13 0230 04/24/13 1152  PROBNP 5790.0* 6000.0* 736.4*   CBG:  Recent Labs Lab 06/17/13 1149 06/17/13 1645 06/17/13 2110 06/18/13 0740 06/18/13 1138  GLUCAP 83 84 90 73 112*       Signed:  KRISHNAN,SENDIL K  [facility] 06/18/2013, 11:58 AM

## 2013-06-18 NOTE — Discharge Instructions (Signed)
Dehydration, Elderly Dehydration means your body does not have as much fluid as it needs. Your kidneys, brain, and heart will not work properly without the right amount of fluids and salt. Older adults are more likely to become dehydrated than younger adults. This is because:   Their bodies do not hold water as well.  Their bodies do not respond to temperature changes as well.  They do not get thirsty as easily or as quickly. HOME CARE  Ask your doctor how to replace body fluid losses (rehydrate).  Drink enough fluids to keep your pee (urine) clear or pale yellow.  Drink small amounts of fluids often if you feel sick to your stomach (nauseous) or throw up (vomit).  Eat like you normally do.  Avoid:  Foods or drinks high in sugar.  Bubbly (carbonated) drinks.  Juice.  Very hot or cold fluids.  Drinks with caffeine.  Fatty, greasy foods.  Alcohol.  Tobacco.  Eating too much.  Gelatin desserts.  Wash your hands to avoid spreading germs (bacteria, viruses).  Only take medicine as told by your doctor.  Keep all doctor visits as told. GET HELP IF:  You have belly (abdominal) pain that gets worse or stays in one spot (localizes).  You have a rash, stiff neck, or bad headache.  You get easily annoyed, sleepy, or are hard to wake up.  You feel weak, dizzy, or very thirsty. GET HELP RIGHT AWAY IF:   You cannot drink fluid without throwing up.  You get worse even with treatment.  You throw up often.  You have watery poop (diarrhea) often.  Your vomit has blood in it or looks greenish.  Your poop (stool) has blood in it or looks black and tarry.  You have not peed in 6 to 8 hours or have only peed a small amount of very dark pee.  You have a fever.  You pass out (faint). MAKE SURE YOU:   Understand these instructions.  Will watch your condition.  Will get help right away if you are not doing well or get worse. Document Released: 12/11/2010 Document  Revised: 10/12/2012 Document Reviewed: 08/29/2012 [employer] Patient Information 2014 [employer].  

## 2013-12-14 ENCOUNTER — Encounter (HOSPITAL_COMMUNITY): Payer: Self-pay | Admitting: Cardiology

## 2014-10-28 ENCOUNTER — Emergency Department (INDEPENDENT_AMBULATORY_CARE_PROVIDER_SITE_OTHER)
Admission: EM | Admit: 2014-10-28 | Discharge: 2014-10-28 | Disposition: A | Discharge status: Home / Self Care | Payer: Medicare HMO | Source: Home / Self Care | Attending: Emergency Medicine | Admitting: Emergency Medicine

## 2014-10-28 ENCOUNTER — Emergency Department (INDEPENDENT_AMBULATORY_CARE_PROVIDER_SITE_OTHER): Payer: Medicare HMO

## 2014-10-28 ENCOUNTER — Encounter (HOSPITAL_COMMUNITY): Payer: Self-pay | Admitting: Emergency Medicine

## 2014-10-28 DIAGNOSIS — M25571 Pain in right ankle and joints of right foot: Secondary | ICD-10-CM | POA: Diagnosis not present

## 2014-10-28 LAB — URIC ACID: URIC ACID, SERUM: 9 mg/dL — AB (ref 2.3–6.6)

## 2014-10-28 MED ORDER — PREDNISONE 50 MG PO TABS: ORAL_TABLET | ORAL | Status: DC

## 2014-10-28 NOTE — ED Notes (Signed)
C/o right knee pain onset Thursday; 10/10 Pain increases w/activity Hx of stroke in 2000 affecting right side Denies inj/trauma A&O x4... No acute distress.

## 2014-10-28 NOTE — Discharge Instructions (Signed)

## 2014-10-28 NOTE — ED Provider Notes (Signed)
CSN: [id number]     Arrival date & time 10/28/14  1308 History   First MD Initiated Contact with Patient 10/28/14 1333     Chief Complaint  Patient presents with  . Knee Pain   (Consider location/radiation/quality/duration/timing/severity/associated sxs/prior Treatment) HPI  [name] is a 69 y.o. female presenting with right foot/ankle pain since Thursday 10-25-14. Patient states that the pain started in her right ankle and is now feeling some pain in her right knee today as well. Pain is 10/10 throbbing pain worse on the lateral aspect of right ankle and foot that radiates up to right knee. Pain is accompanied with swelling of entire foot and ankle. Patient denies injury or trauma to the area. Patient has tried an OTC ointment, but this did not relieve the pain. Patient unable to take NSAIDs due to diabetic kidney disease. Pain is worsened with movement and with weight bearing. Patient states that pain and swelling are worsening. Patient with history of stroke in 2000 that requires her to wear a brace on her right foot due to inversion. Patient had pain and swelling in this ankle two years ago and received a cortisone injection. Patient called her PCP Dr. August Saucer on Friday and was told to be seen by Urgent Care "for possible injection." Patient had one episode of vomiting this morning that "may have been from something I ate." Patient denies fever, chills, SOB, nausea, diarrhea, numbness, tingling, dizziness, or LOC.   Past Medical History  Diagnosis Date  . Myocardial infarction (HCC) 04/2012  . DVT (deep venous thrombosis) (HCC)     "?RLE; was on coumadin f"or 3 yrs"  . Pneumonia     "3 times" (01/19/2013)  . Exertional asthma   . Type II diabetes mellitus (HCC)   . GERD (gastroesophageal reflux disease)   . Headache(784.0)     "q other week" (01/19/2013)  . Arthritis     "knees" (01/19/2013)  . Stroke [facility]) 2005    R sided deficits  . Chronic diastolic CHF (congestive heart failure) (HCC)    . Coronary artery disease    Past Surgical History  Procedure Laterality Date  . Tubal ligation    . Cataract extraction extracapsular Left   . Dilation and curettage of uterus    . Coronary angioplasty with stent placement  04/2012  . Coronary angioplasty  04/2012  . Left heart catheterization with coronary angiogram N/A 04/05/2012    Procedure: LEFT HEART CATHETERIZATION WITH CORONARY ANGIOGRAM;  Surgeon: Robynn Pane, MD;  Location: [facility] CATH LAB;  Service: Cardiovascular;  Laterality: N/A;  . Percutaneous coronary stent intervention (pci-s)  04/05/2012    Procedure: PERCUTANEOUS CORONARY STENT INTERVENTION (PCI-S);  Surgeon: Robynn Pane, MD;  Location: [facility] CATH LAB;  Service: Cardiovascular;;  des RCA   Family History  Problem Relation Age of Onset  . Heart attack Mother   . Heart failure Mother   . Stomach cancer Father    Social History  Substance Use Topics  . Smoking status: Former Smoker -- 0.50 packs/day for 15 years  . Smokeless tobacco: Never Used  . Alcohol Use: No   OB History    No data available     Review of Systems  Constitutional: Negative for fever, chills, appetite change and fatigue.  HENT: Negative for congestion, rhinorrhea and sore throat.   Respiratory: Negative for cough, chest tightness and shortness of breath.   Cardiovascular: Negative for chest pain and palpitations.  Gastrointestinal: Positive for vomiting. Negative for nausea,  abdominal pain, diarrhea and constipation.  Genitourinary: Negative for dysuria.  Musculoskeletal: Positive for joint swelling (right ankle) and gait problem.  Skin: Positive for color change (possible erythema to right foot/ankle). Negative for pallor, rash and wound.  Neurological: Negative for dizziness, syncope, light-headedness and numbness.  Psychiatric/Behavioral: Negative for agitation.    Allergies  Penicillins  Home Medications   Prior to Admission medications   Medication Sig Start Date End Date  Taking? Authorizing Provider  aspirin 81 MG EC tablet Take 81 mg by mouth daily. 04/07/12  Yes Rinaldo Cloud, MD  atorvastatin (LIPITOR) 20 MG tablet Take 20 mg by mouth daily.    Yes Historical Provider, MD  calcium-vitamin D (OSCAL WITH D) 500-200 MG-UNIT per tablet Take 1 tablet by mouth daily with breakfast.   Yes Historical Provider, MD  carvedilol (COREG) 3.125 MG tablet Take 3.125 mg by mouth 2 (two) times daily with a meal. 04/20/13  Yes Rhetta Mura, MD  clopidogrel (PLAVIX) 75 MG tablet Take 75 mg by mouth daily.   Yes Historical Provider, MD  ferrous sulfate 325 (65 FE) MG tablet Take 325 mg by mouth 3 (three) times daily with meals. 04/07/12  Yes Rinaldo Cloud, MD  FLUoxetine (PROZAC) 10 MG capsule Take 10 mg by mouth daily.   Yes Historical Provider, MD  metFORMIN (GLUCOPHAGE) 500 MG tablet Take by mouth 2 (two) times daily with a meal.   Yes Historical Provider, MD  pantoprazole (PROTONIX) 40 MG tablet Take 40 mg by mouth daily.   Yes Historical Provider, MD  feeding supplement, ENSURE COMPLETE, (ENSURE COMPLETE) LIQD Take 237 mLs by mouth 2 (two) times daily between meals. 06/18/13   Hollice Espy, MD  nitroGLYCERIN (NITROSTAT) 0.4 MG SL tablet Place 0.4 mg under the tongue every 5 (five) minutes x 3 doses as needed for chest pain. 04/07/12   Rinaldo Cloud, MD  predniSONE (DELTASONE) 50 MG tablet Take 1 pill daily for 5 days. 10/28/14   Charm Rings, MD   Meds Ordered and Administered this Visit  Medications - No data to display  BP 140/69 mmHg  Pulse 82  Temp(Src) 98.6 F (37 C) (Oral)  Resp 16  SpO2 100% No data found.   Physical Exam  Constitutional: She is oriented to person, place, and time. She appears well-developed and well-nourished. No distress.  HENT:  Head: Normocephalic and atraumatic.  Eyes: Conjunctivae are normal. Pupils are equal, round, and reactive to light.  Neck: Normal range of motion. Neck supple.  Cardiovascular: Normal rate, regular rhythm  and normal heart sounds.   Cap refill <2 sec on bilateral toes.   Pulmonary/Chest: Effort normal and breath sounds normal.  Abdominal: Soft. There is no tenderness.  Musculoskeletal: She exhibits edema and tenderness.       Right knee: She exhibits normal range of motion, no swelling, no effusion, no ecchymosis, no deformity, no laceration and no erythema. Tenderness (mild tenderness) found.       Right ankle: She exhibits decreased range of motion and swelling (of complete ankle and foot). She exhibits no ecchymosis, no deformity and no laceration. Tenderness. Lateral malleolus (tenderness) and medial malleolus tenderness found.  Right ankle with chronic inversion. Strength reduced on right ankle inversion/eversion, plantar flexion, and dorsiflexion.   Right ankle and foot are swollen, worse on the lateral aspects. There is some mild erythema in the lateral foot. She is diffusely tender to light touch.  Lymphadenopathy:    She has no cervical adenopathy.  Neurological: She  is alert and oriented to person, place, and time.  Skin: Skin is warm and dry. No rash noted. She is not diaphoretic. There is erythema (right ankle/foot). No pallor.  Psychiatric: She has a normal mood and affect. Her behavior is normal.    ED Course  Procedures (including critical care time)  Labs Review Labs Reviewed  URIC ACID    Imaging Review Dg Ankle Complete Right  10/28/2014  CLINICAL DATA:  69 year old female with acute right ankle pain and swelling. Initial encounter. EXAM: RIGHT ANKLE - COMPLETE 3+ VIEW COMPARISON:  None. FINDINGS: Diffuse soft tissue swelling is noted. There is no evidence of fracture, subluxation or dislocation. No focal bony abnormalities are present. No radiopaque foreign bodies are identified. IMPRESSION: Soft tissue swelling without bony abnormality. Electronically Signed   By: Harmon PierJeffrey  Hu M.D.   On: 10/28/2014 14:50   Dg Foot Complete Right  10/28/2014  CLINICAL DATA:  Increasing  pain and swelling right foot and ankle. No injury. EXAM: RIGHT FOOT COMPLETE - 3+ VIEW COMPARISON:  None. FINDINGS: Exam demonstrates mild degenerative change over the interphalangeal joints. There is no acute fracture or dislocation. Mild soft tissue swelling over the dorsum of the foot. Normal alignment. Mild diffuse decreased bone mineralization is present. IMPRESSION: No acute findings. Electronically Signed   By: Elberta Fortisaniel  Boyle M.D.   On: 10/28/2014 14:51      MDM   1. Ankle pain, right    69 yo female presenting with worsening right foot/ankle pain and swelling for 3 days. Right ankle and foot x-rays show soft tissue swelling with no bony abnormality or joint effusion. Patient is afebrile. Differential includes arthritis flare, ankle sprain, or gout. Less likely to be gout based on x-ray findings, but not ruled out due to severe pain and swelling. Also checking uric acid level. Rx Prednisone 50 mg daily x 5 days for pain and inflammation to cover everything in differential. Patient to use ice as needed for pain and swelling. Patient voices no other emergency medical complaints at this time. Patient to follow up with PCP as needed.   This patient was seen and examined with a student. I have reviewed and addended the note as necessary.   Charm RingsErin J Florabelle Cardin, MD 10/28/14 [phone]

## 2015-07-25 ENCOUNTER — Encounter (HOSPITAL_COMMUNITY): Admission: EM | Disposition: E | Payer: Self-pay | Source: Home / Self Care | Attending: Neurology

## 2015-07-25 ENCOUNTER — Inpatient Hospital Stay (HOSPITAL_COMMUNITY): Payer: Medicare HMO

## 2015-07-25 ENCOUNTER — Inpatient Hospital Stay (HOSPITAL_COMMUNITY): Payer: Medicare HMO | Admitting: Certified Registered"

## 2015-07-25 ENCOUNTER — Encounter (HOSPITAL_COMMUNITY): Payer: Self-pay

## 2015-07-25 ENCOUNTER — Emergency Department (HOSPITAL_COMMUNITY): Payer: Medicare HMO

## 2015-07-25 ENCOUNTER — Inpatient Hospital Stay (HOSPITAL_COMMUNITY)
Admission: EM | Admit: 2015-07-25 | Discharge: 2015-08-06 | DRG: 023 | Disposition: E | Payer: Medicare HMO | Attending: Neurology | Admitting: Neurology

## 2015-07-25 DIAGNOSIS — K219 Gastro-esophageal reflux disease without esophagitis: Secondary | ICD-10-CM | POA: Diagnosis present

## 2015-07-25 DIAGNOSIS — I609 Nontraumatic subarachnoid hemorrhage, unspecified: Secondary | ICD-10-CM | POA: Diagnosis not present

## 2015-07-25 DIAGNOSIS — Q251 Coarctation of aorta: Secondary | ICD-10-CM | POA: Diagnosis not present

## 2015-07-25 DIAGNOSIS — I161 Hypertensive emergency: Secondary | ICD-10-CM | POA: Diagnosis present

## 2015-07-25 DIAGNOSIS — Z86718 Personal history of other venous thrombosis and embolism: Secondary | ICD-10-CM

## 2015-07-25 DIAGNOSIS — I5032 Chronic diastolic (congestive) heart failure: Secondary | ICD-10-CM | POA: Diagnosis present

## 2015-07-25 DIAGNOSIS — I63512 Cerebral infarction due to unspecified occlusion or stenosis of left middle cerebral artery: Secondary | ICD-10-CM | POA: Diagnosis not present

## 2015-07-25 DIAGNOSIS — Z66 Do not resuscitate: Secondary | ICD-10-CM | POA: Diagnosis not present

## 2015-07-25 DIAGNOSIS — I6200 Nontraumatic subdural hemorrhage, unspecified: Secondary | ICD-10-CM | POA: Diagnosis not present

## 2015-07-25 DIAGNOSIS — Z7982 Long term (current) use of aspirin: Secondary | ICD-10-CM

## 2015-07-25 DIAGNOSIS — I6522 Occlusion and stenosis of left carotid artery: Secondary | ICD-10-CM | POA: Diagnosis present

## 2015-07-25 DIAGNOSIS — Z9289 Personal history of other medical treatment: Secondary | ICD-10-CM

## 2015-07-25 DIAGNOSIS — Z79899 Other long term (current) drug therapy: Secondary | ICD-10-CM

## 2015-07-25 DIAGNOSIS — Z87891 Personal history of nicotine dependence: Secondary | ICD-10-CM

## 2015-07-25 DIAGNOSIS — I619 Nontraumatic intracerebral hemorrhage, unspecified: Secondary | ICD-10-CM

## 2015-07-25 DIAGNOSIS — I639 Cerebral infarction, unspecified: Secondary | ICD-10-CM | POA: Diagnosis present

## 2015-07-25 DIAGNOSIS — Z789 Other specified health status: Secondary | ICD-10-CM | POA: Diagnosis not present

## 2015-07-25 DIAGNOSIS — G9340 Encephalopathy, unspecified: Secondary | ICD-10-CM | POA: Diagnosis not present

## 2015-07-25 DIAGNOSIS — I69351 Hemiplegia and hemiparesis following cerebral infarction affecting right dominant side: Secondary | ICD-10-CM | POA: Diagnosis not present

## 2015-07-25 DIAGNOSIS — Z4659 Encounter for fitting and adjustment of other gastrointestinal appliance and device: Secondary | ICD-10-CM

## 2015-07-25 DIAGNOSIS — M170 Bilateral primary osteoarthritis of knee: Secondary | ICD-10-CM | POA: Diagnosis present

## 2015-07-25 DIAGNOSIS — T45615A Adverse effect of thrombolytic drugs, initial encounter: Secondary | ICD-10-CM | POA: Diagnosis not present

## 2015-07-25 DIAGNOSIS — R29723 NIHSS score 23: Secondary | ICD-10-CM | POA: Diagnosis present

## 2015-07-25 DIAGNOSIS — D62 Acute posthemorrhagic anemia: Secondary | ICD-10-CM | POA: Diagnosis not present

## 2015-07-25 DIAGNOSIS — I252 Old myocardial infarction: Secondary | ICD-10-CM | POA: Diagnosis not present

## 2015-07-25 DIAGNOSIS — N179 Acute kidney failure, unspecified: Secondary | ICD-10-CM | POA: Diagnosis not present

## 2015-07-25 DIAGNOSIS — I69320 Aphasia following cerebral infarction: Secondary | ICD-10-CM

## 2015-07-25 DIAGNOSIS — E46 Unspecified protein-calorie malnutrition: Secondary | ICD-10-CM | POA: Diagnosis present

## 2015-07-25 DIAGNOSIS — R4020 Unspecified coma: Secondary | ICD-10-CM | POA: Diagnosis not present

## 2015-07-25 DIAGNOSIS — J9600 Acute respiratory failure, unspecified whether with hypoxia or hypercapnia: Secondary | ICD-10-CM | POA: Insufficient documentation

## 2015-07-25 DIAGNOSIS — Z880 Allergy status to penicillin: Secondary | ICD-10-CM

## 2015-07-25 DIAGNOSIS — I63412 Cerebral infarction due to embolism of left middle cerebral artery: Principal | ICD-10-CM | POA: Diagnosis present

## 2015-07-25 DIAGNOSIS — E1122 Type 2 diabetes mellitus with diabetic chronic kidney disease: Secondary | ICD-10-CM | POA: Diagnosis present

## 2015-07-25 DIAGNOSIS — Z681 Body mass index (BMI) 19 or less, adult: Secondary | ICD-10-CM

## 2015-07-25 DIAGNOSIS — I6789 Other cerebrovascular disease: Secondary | ICD-10-CM | POA: Diagnosis not present

## 2015-07-25 DIAGNOSIS — G936 Cerebral edema: Secondary | ICD-10-CM | POA: Diagnosis not present

## 2015-07-25 DIAGNOSIS — H5347 Heteronymous bilateral field defects: Secondary | ICD-10-CM | POA: Diagnosis present

## 2015-07-25 DIAGNOSIS — Z515 Encounter for palliative care: Secondary | ICD-10-CM | POA: Diagnosis not present

## 2015-07-25 DIAGNOSIS — I2510 Atherosclerotic heart disease of native coronary artery without angina pectoris: Secondary | ICD-10-CM | POA: Diagnosis present

## 2015-07-25 DIAGNOSIS — R509 Fever, unspecified: Secondary | ICD-10-CM

## 2015-07-25 DIAGNOSIS — Z9842 Cataract extraction status, left eye: Secondary | ICD-10-CM

## 2015-07-25 DIAGNOSIS — E785 Hyperlipidemia, unspecified: Secondary | ICD-10-CM | POA: Diagnosis present

## 2015-07-25 DIAGNOSIS — N189 Chronic kidney disease, unspecified: Secondary | ICD-10-CM | POA: Diagnosis present

## 2015-07-25 DIAGNOSIS — R414 Neurologic neglect syndrome: Secondary | ICD-10-CM | POA: Diagnosis present

## 2015-07-25 DIAGNOSIS — Z978 Presence of other specified devices: Secondary | ICD-10-CM | POA: Insufficient documentation

## 2015-07-25 DIAGNOSIS — Z7902 Long term (current) use of antithrombotics/antiplatelets: Secondary | ICD-10-CM

## 2015-07-25 DIAGNOSIS — R531 Weakness: Secondary | ICD-10-CM

## 2015-07-25 DIAGNOSIS — I10 Essential (primary) hypertension: Secondary | ICD-10-CM | POA: Diagnosis not present

## 2015-07-25 DIAGNOSIS — R569 Unspecified convulsions: Secondary | ICD-10-CM | POA: Diagnosis not present

## 2015-07-25 DIAGNOSIS — Z955 Presence of coronary angioplasty implant and graft: Secondary | ICD-10-CM

## 2015-07-25 HISTORY — PX: RADIOLOGY WITH ANESTHESIA: SHX6223

## 2015-07-25 HISTORY — PX: IR GENERIC HISTORICAL: IMG1180011

## 2015-07-25 LAB — RAPID URINE DRUG SCREEN, HOSP PERFORMED
Amphetamines: NOT DETECTED
BARBITURATES: NOT DETECTED
BENZODIAZEPINES: NOT DETECTED
Cocaine: NOT DETECTED
Opiates: NOT DETECTED
TETRAHYDROCANNABINOL: NOT DETECTED

## 2015-07-25 LAB — ETHANOL: Alcohol, Ethyl (B): <5 mg/dL (ref <5)

## 2015-07-25 LAB — COMPREHENSIVE METABOLIC PANEL
ALBUMIN: 3.5 g/dL (ref 3.5–5.0)
ALT: 11 U/L — AB (ref 14–54)
AST: 18 U/L (ref 15–41)
Alkaline Phosphatase: 89 U/L (ref 38–126)
Anion gap: 5 (ref 5–15)
BILIRUBIN TOTAL: 0.3 mg/dL (ref 0.3–1.2)
BUN: 36 mg/dL — AB (ref 6–20)
CHLORIDE: 113 mmol/L — AB (ref 101–111)
CO2: 21 mmol/L — ABNORMAL LOW (ref 22–32)
CREATININE: 2.97 mg/dL — AB (ref 0.44–1.00)
Calcium: 8.6 mg/dL — ABNORMAL LOW (ref 8.9–10.3)
GFR calc Af Amer: 17 mL/min — ABNORMAL LOW (ref >60)
GFR, EST NON AFRICAN AMERICAN: 15 mL/min — AB (ref >60)
GLUCOSE: 123 mg/dL — AB (ref 65–99)
Potassium: 4.8 mmol/L (ref 3.5–5.1)
Sodium: 139 mmol/L (ref 135–145)
Total Protein: 7 g/dL (ref 6.5–8.1)

## 2015-07-25 LAB — URINALYSIS, ROUTINE W REFLEX MICROSCOPIC
BILIRUBIN URINE: NEGATIVE
GLUCOSE, UA: NEGATIVE mg/dL
KETONES UR: NEGATIVE mg/dL
Leukocytes, UA: NEGATIVE
Nitrite: NEGATIVE
PROTEIN: 30 mg/dL — AB
Specific Gravity, Urine: 1.012 (ref 1.005–1.030)
pH: 6 (ref 5.0–8.0)

## 2015-07-25 LAB — I-STAT TROPONIN, ED: TROPONIN I, POC: 0.01 ng/mL (ref 0.00–0.08)

## 2015-07-25 LAB — URINE MICROSCOPIC-ADD ON: Bacteria, UA: NONE SEEN

## 2015-07-25 LAB — DIFFERENTIAL
BASOS ABS: 0 10*3/uL (ref 0.0–0.1)
Basophils Relative: 1 %
Eosinophils Absolute: 0.1 10*3/uL (ref 0.0–0.7)
Eosinophils Relative: 1 %
LYMPHS ABS: 3.2 10*3/uL (ref 0.7–4.0)
Lymphocytes Relative: 45 %
MONOS PCT: 7 %
Monocytes Absolute: 0.5 10*3/uL (ref 0.1–1.0)
NEUTROS ABS: 3.2 10*3/uL (ref 1.7–7.7)
Neutrophils Relative %: 46 %

## 2015-07-25 LAB — CBC
HEMATOCRIT: 29.5 % — AB (ref 36.0–46.0)
HEMOGLOBIN: 9.5 g/dL — AB (ref 12.0–15.0)
MCH: 28.3 pg (ref 26.0–34.0)
MCHC: 32.2 g/dL (ref 30.0–36.0)
MCV: 87.8 fL (ref 78.0–100.0)
Platelets: 281 10*3/uL (ref 150–400)
RBC: 3.36 MIL/uL — AB (ref 3.87–5.11)
RDW: 13.5 % (ref 11.5–15.5)
WBC: 6.9 10*3/uL (ref 4.0–10.5)

## 2015-07-25 LAB — I-STAT CHEM 8, ED
BUN: 36 mg/dL — AB (ref 6–20)
CHLORIDE: 109 mmol/L (ref 101–111)
CREATININE: 3 mg/dL — AB (ref 0.44–1.00)
Calcium, Ion: 1.14 mmol/L (ref 1.12–1.23)
GLUCOSE: 118 mg/dL — AB (ref 65–99)
HCT: 32 % — ABNORMAL LOW (ref 36.0–46.0)
Hemoglobin: 10.9 g/dL — ABNORMAL LOW (ref 12.0–15.0)
POTASSIUM: 4.8 mmol/L (ref 3.5–5.1)
Sodium: 141 mmol/L (ref 135–145)
TCO2: 18 mmol/L (ref 0–100)

## 2015-07-25 LAB — MRSA PCR SCREENING: MRSA by PCR: NEGATIVE

## 2015-07-25 LAB — APTT: APTT: 21 s — AB (ref 24–37)

## 2015-07-25 LAB — PROTIME-INR
INR: 1.11 (ref 0.00–1.49)
Prothrombin Time: 14.5 seconds (ref 11.6–15.2)

## 2015-07-25 SURGERY — RADIOLOGY WITH ANESTHESIA
Anesthesia: General

## 2015-07-25 MED ORDER — SODIUM CHLORIDE 0.9 % IV SOLN
INTRAVENOUS | Status: DC
  Administered 2015-07-25: 18:00:00 via INTRAVENOUS

## 2015-07-25 MED ORDER — NITROGLYCERIN 1 MG/10 ML FOR IR/CATH LAB
INTRA_ARTERIAL | Status: AC
  Filled 2015-07-25: qty 10

## 2015-07-25 MED ORDER — BISACODYL 10 MG RE SUPP: 10.0000 mg | Freq: Every day | RECTAL | Status: DC | PRN

## 2015-07-25 MED ORDER — SENNOSIDES 8.8 MG/5ML PO SYRP: 5.0000 mL | ORAL_SOLUTION | Freq: Two times a day (BID) | ORAL | Status: DC | PRN

## 2015-07-25 MED ORDER — FAMOTIDINE IN NACL 20-0.9 MG/50ML-% IV SOLN: 20.0000 mg | Freq: Two times a day (BID) | INTRAVENOUS | Status: DC

## 2015-07-25 MED ORDER — ANTISEPTIC ORAL RINSE SOLUTION (CORINZ)
7.0000 mL | Freq: Four times a day (QID) | OROMUCOSAL | Status: DC
  Administered 2015-07-26 (×2): 7 mL via OROMUCOSAL

## 2015-07-25 MED ORDER — LABETALOL HCL 5 MG/ML IV SOLN: 20.0000 mg | Freq: Once | INTRAVENOUS | Status: DC

## 2015-07-25 MED ORDER — PROPOFOL 10 MG/ML IV BOLUS
INTRAVENOUS | Status: DC | PRN
  Administered 2015-07-25: 100 mg via INTRAVENOUS

## 2015-07-25 MED ORDER — NICARDIPINE HCL IN NACL 20-0.86 MG/200ML-% IV SOLN
INTRAVENOUS | Status: AC
  Filled 2015-07-25: qty 200

## 2015-07-25 MED ORDER — SODIUM CHLORIDE 0.9 % IV SOLN: INTRAVENOUS | Status: DC

## 2015-07-25 MED ORDER — ACETAMINOPHEN 650 MG RE SUPP
650.0000 mg | Freq: Four times a day (QID) | RECTAL | Status: DC | PRN
  Administered 2015-07-26 (×2): 650 mg via RECTAL
  Filled 2015-07-25 (×3): qty 1

## 2015-07-25 MED ORDER — LACTATED RINGERS IV SOLN
INTRAVENOUS | Status: DC | PRN
  Administered 2015-07-25: 15:00:00 via INTRAVENOUS

## 2015-07-25 MED ORDER — SODIUM CHLORIDE 0.9 % IJ SOLN
25.0000 ug | INTRAVENOUS | Status: DC | PRN
  Administered 2015-07-25: 25 ug via INTRA_ARTERIAL

## 2015-07-25 MED ORDER — ALTEPLASE 30 MG/30 ML FOR INTERV. RAD
1.0000 mg | INTRA_ARTERIAL | Status: AC | PRN
  Filled 2015-07-25: qty 30

## 2015-07-25 MED ORDER — ROCURONIUM BROMIDE 100 MG/10ML IV SOLN
INTRAVENOUS | Status: DC | PRN
Start: 2015-07-25 — End: 2015-07-25
  Administered 2015-07-25: 30 mg via INTRAVENOUS
  Administered 2015-07-25: 10 mg via INTRAVENOUS

## 2015-07-25 MED ORDER — FENTANYL CITRATE (PF) 100 MCG/2ML IJ SOLN: 50.0000 ug | Freq: Once | INTRAMUSCULAR | Status: DC

## 2015-07-25 MED ORDER — DEXTROSE 5 % IV SOLN
10.0000 mg | INTRAVENOUS | Status: DC | PRN
  Administered 2015-07-25: 25 ug/min via INTRAVENOUS

## 2015-07-25 MED ORDER — GLYCOPYRROLATE 0.2 MG/ML IJ SOLN
INTRAMUSCULAR | Status: DC | PRN
  Administered 2015-07-25: 0.4 mg via INTRAVENOUS

## 2015-07-25 MED ORDER — INSULIN ASPART 100 UNIT/ML ~~LOC~~ SOLN
0.0000 [IU] | Freq: Three times a day (TID) | SUBCUTANEOUS | Status: DC
Start: 2015-07-26 — End: 2015-07-31
  Administered 2015-07-26 – 2015-07-31 (×8): 1 [IU] via SUBCUTANEOUS

## 2015-07-25 MED ORDER — FENTANYL BOLUS VIA INFUSION
25.0000 ug | INTRAVENOUS | Status: DC | PRN
  Filled 2015-07-25: qty 25

## 2015-07-25 MED ORDER — IOPAMIDOL (ISOVUE-300) INJECTION 61%
INTRAVENOUS | Status: AC
  Administered 2015-07-25: 90 mL
  Filled 2015-07-25: qty 150

## 2015-07-25 MED ORDER — PROPOFOL 500 MG/50ML IV EMUL: INTRAVENOUS | Status: DC | PRN

## 2015-07-25 MED ORDER — ONDANSETRON HCL 4 MG/2ML IJ SOLN
4.0000 mg | Freq: Four times a day (QID) | INTRAMUSCULAR | Status: DC | PRN
  Administered 2015-07-25: 4 mg via INTRAVENOUS
  Filled 2015-07-25: qty 2

## 2015-07-25 MED ORDER — CHLORHEXIDINE GLUCONATE 0.12% ORAL RINSE (MEDLINE KIT)
15.0000 mL | Freq: Two times a day (BID) | OROMUCOSAL | Status: DC
  Administered 2015-07-25 – 2015-07-26 (×2): 15 mL via OROMUCOSAL

## 2015-07-25 MED ORDER — FENTANYL CITRATE (PF) 100 MCG/2ML IJ SOLN
INTRAMUSCULAR | Status: DC | PRN
  Administered 2015-07-25: 50 ug via INTRAVENOUS
  Administered 2015-07-25: 100 ug via INTRAVENOUS
  Administered 2015-07-25 (×2): 50 ug via INTRAVENOUS

## 2015-07-25 MED ORDER — STROKE: EARLY STAGES OF RECOVERY BOOK
Freq: Once | Status: DC
  Filled 2015-07-25: qty 1

## 2015-07-25 MED ORDER — ACETAMINOPHEN 325 MG PO TABS: 650.0000 mg | ORAL_TABLET | ORAL | Status: DC | PRN

## 2015-07-25 MED ORDER — PROPOFOL 500 MG/50ML IV EMUL
INTRAVENOUS | Status: DC | PRN
  Administered 2015-07-25: 30 ug/kg/min via INTRAVENOUS

## 2015-07-25 MED ORDER — SENNOSIDES-DOCUSATE SODIUM 8.6-50 MG PO TABS: 1.0000 | ORAL_TABLET | Freq: Every evening | ORAL | Status: DC | PRN

## 2015-07-25 MED ORDER — NICARDIPINE HCL IN NACL 20-0.86 MG/200ML-% IV SOLN
5.0000 mg/h | INTRAVENOUS | Status: DC
  Administered 2015-07-25: 3 mg/h via INTRAVENOUS

## 2015-07-25 MED ORDER — STROKE: EARLY STAGES OF RECOVERY BOOK
Freq: Once | Status: DC
Start: 2015-07-25 — End: 2015-07-31
  Filled 2015-07-25: qty 1

## 2015-07-25 MED ORDER — SODIUM CHLORIDE 0.9 % IV SOLN
25.0000 ug/h | INTRAVENOUS | Status: DC
  Administered 2015-07-25: 25 ug/h via INTRAVENOUS
  Filled 2015-07-25: qty 50

## 2015-07-25 MED ORDER — ESMOLOL HCL 100 MG/10ML IV SOLN
INTRAVENOUS | Status: DC | PRN
  Administered 2015-07-25: 20 mg via INTRAVENOUS
  Administered 2015-07-25: 30 mg via INTRAVENOUS
  Administered 2015-07-25: 20 mg via INTRAVENOUS
  Administered 2015-07-25: 30 mg via INTRAVENOUS

## 2015-07-25 MED ORDER — ACETAMINOPHEN 650 MG RE SUPP: 650.0000 mg | RECTAL | Status: DC | PRN

## 2015-07-25 MED ORDER — SUCCINYLCHOLINE CHLORIDE 20 MG/ML IJ SOLN
INTRAMUSCULAR | Status: DC | PRN
  Administered 2015-07-25: 100 mg via INTRAVENOUS

## 2015-07-25 MED ORDER — ACETAMINOPHEN 500 MG PO TABS
1000.0000 mg | ORAL_TABLET | Freq: Four times a day (QID) | ORAL | Status: DC | PRN
  Administered 2015-07-28 – 2015-07-29 (×4): 1000 mg via ORAL
  Filled 2015-07-25 (×4): qty 2

## 2015-07-25 MED ORDER — ALTEPLASE (STROKE) FULL DOSE INFUSION
48.0000 mg | Freq: Once | INTRAVENOUS | Status: AC
  Administered 2015-07-25: 48 mg via INTRAVENOUS
  Filled 2015-07-25: qty 100

## 2015-07-25 MED ORDER — PHENYLEPHRINE HCL 10 MG/ML IJ SOLN
INTRAMUSCULAR | Status: DC | PRN
  Administered 2015-07-25: 80 ug via INTRAVENOUS

## 2015-07-25 MED ORDER — FAMOTIDINE IN NACL 20-0.9 MG/50ML-% IV SOLN
20.0000 mg | Freq: Two times a day (BID) | INTRAVENOUS | Status: DC
  Administered 2015-07-25 – 2015-07-26 (×2): 20 mg via INTRAVENOUS
  Filled 2015-07-25 (×2): qty 50

## 2015-07-25 MED ORDER — IOPAMIDOL (ISOVUE-300) INJECTION 61%
INTRAVENOUS | Status: AC
  Administered 2015-07-25: 18 mL
  Filled 2015-07-25: qty 150

## 2015-07-25 MED ORDER — CLEVIDIPINE BUTYRATE 0.5 MG/ML IV EMUL: 0.0000 mg/h | INTRAVENOUS | Status: DC

## 2015-07-25 MED ORDER — LIDOCAINE HCL (CARDIAC) 20 MG/ML IV SOLN
INTRAVENOUS | Status: DC | PRN
  Administered 2015-07-25: 50 mg via INTRAVENOUS

## 2015-07-25 MED ORDER — SODIUM CHLORIDE 0.9 % IV SOLN
INTRAVENOUS | Status: DC
  Administered 2015-07-27 – 2015-07-29 (×3): via INTRAVENOUS
  Administered 2015-07-30: 75 mL/h via INTRAVENOUS

## 2015-07-25 NOTE — Progress Notes (Signed)
RT NOTE:  Radiology called RN and request ETT be pulled back 4cm follow recent XRAY. ETT now secured @ 20cm/lip.

## 2015-07-25 NOTE — ED Notes (Signed)
Neurology talking with patient at the bedside

## 2015-07-25 NOTE — ED Notes (Signed)
160/82 BP in CT Scanner

## 2015-07-25 NOTE — H&P (Signed)
PULMONARY / CRITICAL CARE MEDICINE   Name: [name] Pallda Umscheid MRN: [id number] DOB: 08-13-1945    ADMISSION DATE:  07/09/2015 CONSULTATION DATE:  07/30/2015  REFERRING MD:  IR  CHIEF COMPLAINT:  stroke  HISTORY OF PRESENT ILLNESS:   [name] is a 70 yo female who was found to be less responsive and weak on her right side by family today 2 hours prior to presentation to the ED. Patient was brought to the ED and was noted to have slurred speech and right sided hemineglect and weakness. A code stroke was called. CT performed and was concerning for left MCA ischemic stroke. TPA was subsequently given. Neurology decided to proceed with endovascular intervention with interventional radiology to retrieve the clot. Interventional radiology performed revascularization and reperfusion was achieved. She was placed on the vent during the procedure.   PCCM was consulted for management of mechanical ventilation and medical issues.   PAST MEDICAL HISTORY :  She  has a past medical history of Myocardial infarction (HCC) (04/2012); DVT (deep venous thrombosis) (HCC); Pneumonia; Exertional asthma; Type II diabetes mellitus (HCC); GERD (gastroesophageal reflux disease); Headache(784.0); Arthritis; Stroke (HCC) (2005); Chronic diastolic CHF (congestive heart failure) (HCC); and Coronary artery disease.  PAST SURGICAL HISTORY: She  has past surgical history that includes Tubal ligation; Cataract extraction, extracapsular (Left); Dilation and curettage of uterus; Coronary angioplasty with stent (04/2012); Coronary angioplasty (04/2012); left heart catheterization with coronary angiogram (N/A, 04/05/2012); and percutaneous coronary stent intervention (pci-s) (04/05/2012).  Allergies  Allergen Reactions  . Penicillins Nausea And Vomiting    No current facility-administered medications on file prior to encounter.   Current Outpatient Prescriptions on File Prior to Encounter  Medication Sig  . aspirin 81 MG EC tablet Take 81 mg  by mouth daily.  Marland Kitchen. atorvastatin (LIPITOR) 20 MG tablet Take 20 mg by mouth daily.   . carvedilol (COREG) 3.125 MG tablet Take 3.125 mg by mouth 2 (two) times daily with a meal.  . clopidogrel (PLAVIX) 75 MG tablet Take 75 mg by mouth daily.  Marland Kitchen. FLUoxetine (PROZAC) 10 MG capsule Take 10 mg by mouth daily.  . pantoprazole (PROTONIX) 40 MG tablet Take 40 mg by mouth daily.  . calcium-vitamin D (OSCAL WITH D) 500-200 MG-UNIT per tablet Take 1 tablet by mouth daily with breakfast.  . ferrous sulfate 325 (65 FE) MG tablet Take 325 mg by mouth 3 (three) times daily with meals.  . nitroGLYCERIN (NITROSTAT) 0.4 MG SL tablet Place 0.4 mg under the tongue every 5 (five) minutes x 3 doses as needed for chest pain.    FAMILY HISTORY:  Her indicated that her mother is deceased. She indicated that her father is deceased.   SOCIAL HISTORY: She  reports that she has quit smoking. She has never used smokeless tobacco. She reports that she does not drink alcohol or use illicit drugs.  REVIEW OF SYSTEMS:   Unable to obtain due to ventilation  SUBJECTIVE:  See HPI  VITAL SIGNS: BP 123/78 mmHg  Pulse 92  Temp(Src) 98.2 F (36.8 C) (Oral)  Resp 13  Ht 5\' 6"  (1.676 m)  Wt 118 lb 9.7 oz (53.8 kg)  BMI 19.15 kg/m2  SpO2 100%  HEMODYNAMICS:    VENTILATOR SETTINGS: Vent Mode:  [-] PRVC FiO2 (%):  [100 %] 100 % Set Rate:  [12 bmp] 12 bmp Vt Set:  [500 mL] 500 mL PEEP:  [5 cmH20] 5 cmH20 Plateau Pressure:  [20 cmH20] 20 cmH20  INTAKE / OUTPUT:  PHYSICAL EXAMINATION: General:  Thin elderly female in NAD, laying in bed sedated Neuro:  Sedated, follows simple commands, 4/5 strength in bilateral upper extremities HEENT:  Normocephalic, R pupil reactive to light, L pupil oval in shape and non reactive, vent in place Cardiovascular:  Regular rate and rhythm, no murmurs appreciated, 2+ DP pulses bilaterally Lungs: Symmetric rise and fall on ventilator, clear to auscultation bilaterally Abdomen:   Soft, non distended, normal bowel sounds Musculoskeletal:  Decreased muscle bulk, no edema, feet are cold Skin: No rashes  LABS:  BMET  Recent Labs Lab 08/18/15 1346 August 18, 2015 1356  NA 139 141  K 4.8 4.8  CL 113* 109  CO2 21*  --   BUN 36* 36*  CREATININE 2.97* 3.00*  GLUCOSE 123* 118*    Electrolytes  Recent Labs Lab 08-18-2015 1346  CALCIUM 8.6*    CBC  Recent Labs Lab 08-18-2015 1346 08-18-2015 1356  WBC 6.9  --   HGB 9.5* 10.9*  HCT 29.5* 32.0*  PLT 281  --     Coag's  Recent Labs Lab 2015/08/18 1346  APTT 21*  INR 1.11    Sepsis Markers No results for input(s): LATICACIDVEN, PROCALCITON, O2SATVEN in the last 168 hours.  ABG No results for input(s): PHART, PCO2ART, PO2ART in the last 168 hours.  Liver Enzymes  Recent Labs Lab 08-18-15 1346  AST 18  ALT 11*  ALKPHOS 89  BILITOT 0.3  ALBUMIN 3.5    Cardiac Enzymes No results for input(s): TROPONINI, PROBNP in the last 168 hours.  Glucose No results for input(s): GLUCAP in the last 168 hours.  Imaging Ct Head Wo Contrast  08-18-15  CLINICAL DATA:  Acute left MCA infarct, left middle cerebral artery embolism, status post stroke intervention. EXAM: CT HEAD WITHOUT CONTRAST TECHNIQUE: Contiguous axial images were obtained from the base of the skull through the vertex without intravenous contrast. COMPARISON:  Aug 18, 2015 FINDINGS: Brain: Diffuse sulcal effacement and loss of gray-white differentiation throughout the left cerebral hemisphere MCA territory compatible with an acute left MCA infarct. Diffuse vascular enhancement noted from the recent neuro intervention. Hyper attenuation remains of the left MCA. No extra-axial hemorrhage or subarachnoid hemorrhage. No midline shift, herniation or hydrocephalus. Cisterns are patent. No cerebellar abnormality. Remote left occipital PCA territory infarct with encephalomalacia. Vascular: Persistent hyper attenuation of the left MCA. Skull: Negative for  fracture or focal lesion. Sinuses/Orbits: No acute findings. Other: None. IMPRESSION: Early CT findings of a large diffuse left cerebral hemisphere MCA infarct involving the frontal, temporal and parietal lobes with sulcal effacement and loss of gray-white differentiation. Persistent hyper attenuation of the left MCA vasculature. No acute intracranial hemorrhage. Electronically Signed   By: Judie Petit.  Shick M.D.   On: Aug 18, 2015 17:25   Ct Head Code Stroke W/o Cm  2015-08-18  ADDENDUM REPORT: 08/18/2015 14:11 ADDENDUM: Study discussed by telephone with Dr. Ritta Slot on 2015-08-18 at 1403 hours. Electronically Signed   By: Odessa Fleming M.D.   On: 08/18/15 14:11  18-Aug-2015  CLINICAL DATA:  70 year old female code stroke. Right side weakness. Patient on Plavix. Initial encounter. EXAM: CT HEAD WITHOUT CONTRAST TECHNIQUE: Contiguous axial images were obtained from the base of the skull through the vertex without intravenous contrast. COMPARISON:  Head CT without contrast 04/15/2013. FINDINGS: Positive for a hyperdense left MCA M1 segment and proximal posterior left M2 branch as seen on series 21, images 10 and 11. There is hypodensity in the left MCA territory in keeping with acute cortically based infarct. ASPECTS  score = 6 (points deducted for changes in the insula, M1, M2, and M5 segments). [place] Stroke Program Early CT Score Normal score = 10 No associated intracranial mass effect. No acute intracranial hemorrhage. No ventriculomegaly. Basilar cisterns remain patent. Chronic lacunar infarcts in both thalami. Chronic left occipital lobe encephalomalacia stable gray-white matter differentiation outside of the left MCA territory. Paranasal sinuses and mastoids today are clear. No acute osseous abnormality identified. No acute orbit or scalp soft tissue findings. Calcified atherosclerosis at the skullbase. IMPRESSION: 1. Positive for hyperdense left MCA M1/bifurcation, and changes of cytotoxic edema in the left  MCA territory. ASPECTS score = 6. 2. No associated hemorrhage or mass effect. 3. Chronic left PCA and bilateral thalamostriate artery territory ischemia. Electronically Signed: By: Odessa Fleming M.D. On: 08/04/2015 14:00     STUDIES:  7/20 CXR: Interval repositioning of the endotracheal tube which is now in satisfactory radiographic position. Mild pulmonary vascular congestion. 7/20 CT Head:Early CT findings of a large diffuse left cerebral hemisphere MCA infarct involving the frontal, temporal and parietal lobes with sulcal effacement and loss of gray-white differentiation. Persistent hyper attenuation of the left MCA vasculature.No acute intracranial hemorrhage.   CULTURES: No cx  ANTIBIOTICS: None  SIGNIFICANT EVENTS: 7/20: Admit to ICU  LINES/TUBES: ET tube Art line  DISCUSSION: Weda Baumgarner is a 70 yo female who was admitted for L MCA stroke, s/p tPA and revascularization, remains on ventilator after revascularization.  ASSESSMENT / PLAN:  PULMONARY A: Inability to protect airway in post operative setting P:   Full vent support Follow ABGs SBT in the morning Vent bundle Likely extubate on 7/22  NEUROLOGIC A:   CVA on left MCA stroke causing aphasia and right hemianopia- s/p tPA and clot retrieval  Hx of Stroke w/ R sided deficits 2005  P:   Sedation while on vent RASS goal: -2 to -3 Neurology consulted Neuro checks q 2 x 12 hours, then neuro checks with vitals IR on board for revascularization  CARDIOVASCULAR A:  Hx of Chronic Diastolic CHF Hx of MI in 2014 Hx of CAD P:  Gentle with IVF   RENAL A:   CKD (baseline Cr ~3) P:   Gentle with IVF Monitor electrolytes daily Replete electrolytes as needed BMP in AM  GASTROINTESTINAL A:   Malnourished Hx of GERD P:   NPO IV Protonix  HEMATOLOGIC A:   Mild Anemia Hx of DVT  P:  SCDs  INFECTIOUS A:   No acute concerns P:   Monitor for fever  ENDOCRINE A:   T2DM (not on any diabetic  medications). Last A1C 6 P:   Sensitive SSI   FAMILY  - Updates: No family at bedside   - Inter-disciplinary family meet or Palliative Care meeting due by:  7/27   Anders Simmonds, MD [facility], PGY-2   07/28/2015, 6:54 PM   STAFF NOTE: Cindi Carbon, MD FACP have personally reviewed patient's available data, including medical history, events of note, physical examination and test results as part of my evaluation. I have discussed with resident/NP and other care providers such as pharmacist, RN and RRT. In addition, I personally evaluated patient and elicited key findings of: seeing pt now, opeens eyes to pain, presentation on admission with rt side weakness c/w LEFt mca, s/p tpa and IR thrombectomy, pcxr with chronic int changes, abg noted , avoid acidosis as will increase ICP, rate was increased repeat abg to ensure proper MV, events later noted with tpa induced ICH,  high volume bleed, FFP given hours ago, consider cryo, at this stage unlikley to change outcome, poor prognosis , will d/w faily code status, likley to progress poorly, seziure noted, ativan keppra, pending code status will need EEG, avoid free water, chem in am, pcxr for ett, no role SBT now with prior acidosis and status, npo, ppi, cbg The patient is critically ill with multiple organ systems failure and requires high complexity decision making for assessment and support, frequent evaluation and titration of therapies, application of advanced monitoring technologies and extensive interpretation of multiple databases.   Critical Care Time devoted to patient care services described in this note is35 Minutes. This time reflects time of care of this signee: Rory Percy, MD FACP. This critical care time does not reflect procedure time, or teaching time or supervisory time of PA/NP/Med student/Med Resident etc but could involve care discussion time. Rest per NP/medical resident whose note is outlined  above and that I agree with   Mcarthur Rossetti. Tyson Alias, MD, FACP Pgr: [phone] [place] 07/26/2015 3:51 AM

## 2015-07-25 NOTE — ED Notes (Signed)
Patient denies pain and is resting comfortably.  

## 2015-07-25 NOTE — Progress Notes (Addendum)
Orthopedic Tech Progress Note Patient Details:  [name] [birth year] [id number] Ortho visit sandbag Patient ID: [name], female   DOB: [birth year], 70 y.o.   MRN: [id number]   [name], [name] 07/24/2015, 7:35 PM

## 2015-07-25 NOTE — Progress Notes (Signed)
eLink Physician-Brief Progress Note Patient Name: [name] DOB: [birth year] MRN: [id number]   Date of Service  08/05/2015  HPI/Events of Note  70 yo female with acute ischemic CVA. Now s/p tPA and mechanical clot retrieval by IR. Intubated and ventilated. PCCM consulted for management of mechanical ventilation and medical issues. Goal SBP = 120-140. Already on SCD's and Pepcid IV.   eICU Interventions  Will order: 1. Mechanical Ventilation: 100%/PRVC 12/TV 500/P 5. 2. ABG at 6:30 PM. 3. Portable CXR now to assess ETT position.      Intervention Category Major Interventions: Respiratory failure - evaluation and management  Dannette Kinkaid Eugene 07/21/2015, 5:53 PM

## 2015-07-25 NOTE — Code Documentation (Signed)
70[facility]  Patient from home where she had sudden onset slurred speech, increased right sided facial droop and right sided weakness at 1130.  Patient with h/o stroke with right sided deficits and speech impairment.  EMS activated a code stroke.  Patient to CT on patient arrival.  Stroke team to the bedside.  CT completed showing left dense MCA sign.  2nd PIV placed.  BP assessed and foley catheter placed.  NIHSS 23, see documentation for details and code stroke times.  Patient with right hemiplegia, right facial droop, severely garbled speech and no blink to threat on the right.  BP within tPA parameters.  5mg  tPA bolus given over 1 minute at 1400 followed by 43mg /hr for a total of 48mg  per pharmacy dosing.  Creatinine 3 on arrival, therefore, no CTA per MD.  Patient transferred back to Trauma B in the ED.  Dr. Amada JupiterKirkpatrick discussed plan of care with family.  After considerable delay, family made the decision to proceed with endovascular intervention.  Patient transported to IR with bedside handoff with IR staff.  Family escorted to the waiting area.

## 2015-07-25 NOTE — Anesthesia Procedure Notes (Signed)
Procedure Name: Intubation Date/Time: Oct 20, 2015 3:19 PM Performed by: Dorie RankQUINN, Platon Arocho M Pre-anesthesia Checklist: Patient identified, Emergency Drugs available, Suction available and Patient being monitored Patient Re-evaluated:Patient Re-evaluated prior to inductionOxygen Delivery Method: Circle system utilized Preoxygenation: Pre-oxygenation with 100% oxygen Intubation Type: IV induction, Rapid sequence and Cricoid Pressure applied Laryngoscope Size: Mac and 3 Grade View: Grade I Tube type: Oral Tube size: 7.0 mm Number of attempts: 1 Airway Equipment and Method: Stylet Placement Confirmation: positive ETCO2,  ETT inserted through vocal cords under direct vision and breath sounds checked- equal and bilateral Tube secured with: Tape Dental Injury: Teeth and Oropharynx as per pre-operative assessment

## 2015-07-25 NOTE — Transfer of Care (Signed)
Immediate Anesthesia Transfer of Care Note  Patient: [name]  Procedure(s) Performed: Procedure(s): RADIOLOGY WITH ANESTHESIA (N/A)  Patient Location: ICU  Anesthesia Type:General  Level of Consciousness: sedated and Patient remains intubated per anesthesia plan  Airway & Oxygen Therapy: Patient remains intubated per anesthesia plan and Patient placed on Ventilator (see vital sign flow sheet for setting)  Post-op Assessment: Report given to RN and Post -op Vital signs reviewed and stable  Post vital signs: Reviewed and stable  Last Vitals:  Filed Vitals:   2015/03/26 1500 2015/03/26 1728  BP: 164/72 168/101  Pulse: 91 110  Temp:    Resp: 22 12    Last Pain: There were no vitals filed for this visit.       Complications: No apparent anesthesia complications

## 2015-07-25 NOTE — ED Notes (Signed)
Pt arrived to Trauma B

## 2015-07-25 NOTE — ED Notes (Signed)
Pt arrived to IR and was take over by team with Sammuel CooperJessica Jarvis at the bedside

## 2015-07-25 NOTE — ED Provider Notes (Signed)
CSN: [id number]     Arrival date & time 07/17/2015  1340 History   First MD Initiated Contact with Patient 08/04/2015 1349     No chief complaint on file.    (Consider location/radiation/quality/duration/timing/severity/associated sxs/prior Treatment) Patient is a 70 y.o. female presenting with Acute Neurological Problem. The history is provided by the patient.  Cerebrovascular Accident This is a new problem. The current episode started 1 to 2 hours ago. The problem occurs constantly. The problem has not changed since onset.Pertinent negatives include no chest pain, no abdominal pain and no shortness of breath. Associated symptoms comments: Slurred speech, right sided weakness. Nothing aggravates the symptoms. Nothing relieves the symptoms. She has tried nothing for the symptoms.    Past Medical History  Diagnosis Date  . Myocardial infarction (HCC) 04/2012  . DVT (deep venous thrombosis) (HCC)     "?RLE; was on coumadin f"or 3 yrs"  . Pneumonia     "3 times" (01/19/2013)  . Exertional asthma   . Type II diabetes mellitus (HCC)   . GERD (gastroesophageal reflux disease)   . Headache(784.0)     "q other week" (01/19/2013)  . Arthritis     "knees" (01/19/2013)  . Stroke [facility]) 2005    R sided deficits  . Chronic diastolic CHF (congestive heart failure) (HCC)   . Coronary artery disease    Past Surgical History  Procedure Laterality Date  . Tubal ligation    . Cataract extraction extracapsular Left   . Dilation and curettage of uterus    . Coronary angioplasty with stent placement  04/2012  . Coronary angioplasty  04/2012  . Left heart catheterization with coronary angiogram N/A 04/05/2012    Procedure: LEFT HEART CATHETERIZATION WITH CORONARY ANGIOGRAM;  Surgeon: Robynn Pane, MD;  Location: [facility] CATH LAB;  Service: Cardiovascular;  Laterality: N/A;  . Percutaneous coronary stent intervention (pci-s)  04/05/2012    Procedure: PERCUTANEOUS CORONARY STENT INTERVENTION (PCI-S);  Surgeon: Robynn Pane, MD;  Location: [facility] CATH LAB;  Service: Cardiovascular;;  des RCA   Family History  Problem Relation Age of Onset  . Heart attack Mother   . Heart failure Mother   . Stomach cancer Father    Social History  Substance Use Topics  . Smoking status: Former Smoker -- 0.50 packs/day for 15 years  . Smokeless tobacco: Never Used  . Alcohol Use: No   OB History    No data available     Review of Systems  Unable to perform ROS: Acuity of condition  Respiratory: Negative for shortness of breath.   Cardiovascular: Negative for chest pain.  Gastrointestinal: Negative for abdominal pain.      Allergies  Penicillins  Home Medications   Prior to Admission medications   Medication Sig Start Date End Date Taking? Authorizing Provider  aspirin 81 MG EC tablet Take 81 mg by mouth daily. 04/07/12  Yes Rinaldo Cloud, MD  atorvastatin (LIPITOR) 20 MG tablet Take 20 mg by mouth daily.    Yes Historical Provider, MD  carvedilol (COREG) 3.125 MG tablet Take 3.125 mg by mouth 2 (two) times daily with a meal. 04/20/13  Yes Rhetta Mura, MD  clopidogrel (PLAVIX) 75 MG tablet Take 75 mg by mouth daily.   Yes Historical Provider, MD  FLUoxetine (PROZAC) 10 MG capsule Take 10 mg by mouth daily.   Yes Historical Provider, MD  pantoprazole (PROTONIX) 40 MG tablet Take 40 mg by mouth daily.   Yes Historical Provider, MD  calcium-vitamin  D (OSCAL WITH D) 500-200 MG-UNIT per tablet Take 1 tablet by mouth daily with breakfast.    Historical Provider, MD  ferrous sulfate 325 (65 FE) MG tablet Take 325 mg by mouth 3 (three) times daily with meals. 04/07/12   Rinaldo Cloud, MD  nitroGLYCERIN (NITROSTAT) 0.4 MG SL tablet Place 0.4 mg under the tongue every 5 (five) minutes x 3 doses as needed for chest pain. 04/07/12   Rinaldo Cloud, MD   There were no vitals taken for this visit. Physical Exam  Constitutional: She appears listless. She appears cachectic. She appears ill. No distress.  HENT:  Head:  Normocephalic.  Eyes: Conjunctivae are normal.  Neck: Neck supple. No tracheal deviation present.  Cardiovascular: Normal rate and regular rhythm.   Pulmonary/Chest: Effort normal. No respiratory distress.  Abdominal: Soft. She exhibits no distension.  Neurological: She appears listless. She is disoriented. She displays no seizure activity.  Speech difficulty, right sided global extremity weakness, right-sided hemi-neglect without guarding on confrontation  Skin: Skin is warm and dry.  Psychiatric: She has a normal mood and affect.    ED Course  Procedures (including critical care time)  CRITICAL CARE Performed by: Lyndal Pulley Total critical care time: 30 minutes Critical care time was exclusive of separately billable procedures and treating other patients. Critical care was necessary to treat or prevent imminent or life-threatening deterioration. Critical care was time spent personally by me on the following activities: development of treatment plan with patient and/or surrogate as well as nursing, discussions with consultants, evaluation of patient's response to treatment, examination of patient, obtaining history from patient or surrogate, ordering and performing treatments and interventions, ordering and review of laboratory studies, ordering and review of radiographic studies, pulse oximetry and re-evaluation of patient's condition.   Labs Review Labs Reviewed  CBC - Abnormal; Notable for the following:    RBC 3.36 (*)    Hemoglobin 9.5 (*)    HCT 29.5 (*)    All other components within normal limits  I-STAT CHEM 8, ED - Abnormal; Notable for the following:    BUN 36 (*)    Creatinine, Ser 3.00 (*)    Glucose, Bld 118 (*)    Hemoglobin 10.9 (*)    HCT 32.0 (*)    All other components within normal limits  DIFFERENTIAL  ETHANOL  PROTIME-INR  APTT  COMPREHENSIVE METABOLIC PANEL  URINE RAPID DRUG SCREEN, HOSP PERFORMED  URINALYSIS, ROUTINE W REFLEX MICROSCOPIC (NOT AT  [facility])  I-STAT TROPOININ, ED  I-STAT TROPOININ, ED  I-STAT CHEM 8, ED  CBG MONITORING, ED    Imaging Review Ct Head Code Stroke W/o Cm  August 01, 2015  ADDENDUM REPORT: 2015-08-01 14:11 ADDENDUM: Study discussed by telephone with Dr. Ritta Slot on 2015-08-01 at 1403 hours. Electronically Signed   By: Odessa Fleming M.D.   On: 08-01-15 14:11  08/01/2015  CLINICAL DATA:  70 year old female code stroke. Right side weakness. Patient on Plavix. Initial encounter. EXAM: CT HEAD WITHOUT CONTRAST TECHNIQUE: Contiguous axial images were obtained from the base of the skull through the vertex without intravenous contrast. COMPARISON:  Head CT without contrast 04/15/2013. FINDINGS: Positive for a hyperdense left MCA M1 segment and proximal posterior left M2 branch as seen on series 21, images 10 and 11. There is hypodensity in the left MCA territory in keeping with acute cortically based infarct. ASPECTS score = 6 (points deducted for changes in the insula, M1, M2, and M5 segments). [place] Stroke Program Early CT Score Normal score =  10 No associated intracranial mass effect. No acute intracranial hemorrhage. No ventriculomegaly. Basilar cisterns remain patent. Chronic lacunar infarcts in both thalami. Chronic left occipital lobe encephalomalacia stable gray-white matter differentiation outside of the left MCA territory. Paranasal sinuses and mastoids today are clear. No acute osseous abnormality identified. No acute orbit or scalp soft tissue findings. Calcified atherosclerosis at the skullbase. IMPRESSION: 1. Positive for hyperdense left MCA M1/bifurcation, and changes of cytotoxic edema in the left MCA territory. ASPECTS score = 6. 2. No associated hemorrhage or mass effect. 3. Chronic left PCA and bilateral thalamostriate artery territory ischemia. Electronically Signed: By: Odessa FlemingH  Hall M.D. On: 08/01/2015 14:00   I have personally reviewed and evaluated these images and lab results as part of my medical  decision-making.   EKG Interpretation None      MDM   Final diagnoses:  Acute ischemic stroke [facility](HCC)   70 year old female presents after being found less responsive and weak especially on her right side roughly an hour and a half prior to arrival. On arrival she was made a code stroke. She is protecting her airway appropriately on arrival but has speech difficulty and right-sided hemi-neglect and weakness. CT is concerning for left MCA ischemic stroke. TPA was administered under the care of neurology who will admit the patient to ICU for further workup and monitoring.    Lyndal Pulleyaniel Abas Leicht, MD 07/19/2015 Rickey [id number]

## 2015-07-25 NOTE — Anesthesia Preprocedure Evaluation (Signed)
Anesthesia Evaluation  Patient identified by MRN, date of birth, ID band Patient awake  General Assessment Comment:Patient examined. Chart reviewed. Case discussed with Dr. Algis Downs. CE  Reviewed: Allergy & Precautions, NPO status , Patient's Chart, lab work & pertinent test results  Airway Mallampati: I  TM Distance: >3 FB Neck ROM: Full    Dental   Pulmonary asthma , pneumonia, former smoker,    breath sounds clear to auscultation       Cardiovascular + CAD, + Past MI and +CHF   Rhythm:Regular Rate:Normal     Neuro/Psych    GI/Hepatic GERD  ,  Endo/Other  diabetes  Renal/GU Renal disease     Musculoskeletal   Abdominal   Peds  Hematology   Anesthesia Other Findings   Reproductive/Obstetrics                             Anesthesia Physical Anesthesia Plan  ASA: IV  Anesthesia Plan: General   Post-op Pain Management:    Induction: Intravenous  Airway Management Planned: Oral ETT  Additional Equipment:   Intra-op Plan:   Post-operative Plan: Possible Post-op intubation/ventilation  Informed Consent: I have reviewed the patients History and Physical, chart, labs and discussed the procedure including the risks, benefits and alternatives for the proposed anesthesia with the patient or authorized representative who has indicated his/her understanding and acceptance.   Dental advisory given  Plan Discussed with: Anesthesiologist and CRNA  Anesthesia Plan Comments:         Anesthesia Quick Evaluation

## 2015-07-25 NOTE — ED Notes (Signed)
Patient transported to CT 

## 2015-07-25 NOTE — Sedation Documentation (Signed)
Pt spoke the word hospital when asked where she was. Could not understand her name when attempted, following commands, nodding head.  Improvement from ED per ED RNs.  Anesthesia here.

## 2015-07-25 NOTE — Procedures (Signed)
S/P 4 vesseol cerebral arteriogram. RT CFA approach. Findings. 1.Near complete occlusion of LT ICA terminus,and Lt ACA prox ,and complete occlusion of LT MCA with complete revascularization of LT MCA,LT ACA and LT ICA terminus achieving a TICI 3 reperfusion with x 2 passes with Solitaire 4 mm x 40 mm SR retrieval device

## 2015-07-25 NOTE — Progress Notes (Signed)
eLink Physician-Brief Progress Note Patient Name: [name] DOB: 03/16/1945 MRN: [id number]   Date of Service  07/26/2015  HPI/Events of Note  Respiratory Failure - 1. ABG on 100%/PRVC 12/TV 500/P 5 = 7.27/48/452/22.5 and 2. ETT in R mainstem bronchus.   eICU Interventions  Will order: 1. Retract ETT 4.5 cm and re-secure. 2. Repeat portable CXR for ETT placement. 3. Increase PRVC rate to 15.      Intervention Category Major Interventions: Respiratory failure - evaluation and management  Sommer,Steven Eugene 07/14/2015, 8:24 PM

## 2015-07-25 NOTE — ED Notes (Signed)
Per [facility], Pt is coming from home with family. Pt has Hx of CVA in 2000 with right sided weakness. Family reports at 1215, pt started to have slurred speech, increased right sided facial droop and right sided weakness.

## 2015-07-25 NOTE — ED Notes (Signed)
Pt being transported to IR at this time.

## 2015-07-25 NOTE — ED Notes (Signed)
Family at bedside. 

## 2015-07-25 NOTE — H&P (Addendum)
H&P    Chief Complaint: Stroke  History obtained from:  daughter  HPI:                                                                                                                                         [name] is an 70 y.o. 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 Today She was seen normal at 1200. Her daughter went out to get her a sausage biscuit and when she returned she tried to sit her mother up. When she did this her mother slumped to the right and could not keep upright. She was not talking clearly. This was very different from her baseline and EMS was called. On arrival CT showed a clear left MCA sign and tPA was given.   Date last known well: Today Time last known well: Time: 12:00 tPA Given: Yes Modified Rankin: Rankin Score=4    Past Medical History  Diagnosis Date  . Myocardial infarction (HCC) 04/2012  . DVT (deep venous thrombosis) (HCC)     "?RLE; was on coumadin f"or 3 yrs"  . Pneumonia     "3 times" (01/19/2013)  . Exertional asthma   . Type II diabetes mellitus (HCC)   . GERD (gastroesophageal reflux disease)   . Headache(784.0)     "q other week" (01/19/2013)  . Arthritis     "knees" (01/19/2013)  . Stroke [facility]) 2005    R sided deficits  . Chronic diastolic CHF (congestive heart failure) (HCC)   . Coronary artery disease     Past Surgical History  Procedure Laterality Date  . Tubal ligation    . Cataract extraction extracapsular Left   . Dilation and curettage of uterus    . Coronary angioplasty with stent placement  04/2012  . Coronary angioplasty  04/2012  . Left heart catheterization with coronary angiogram N/A 04/05/2012    Procedure: LEFT HEART CATHETERIZATION WITH CORONARY ANGIOGRAM;  Surgeon: Robynn Pane, MD;  Location: [facility] CATH LAB;  Service: Cardiovascular;  Laterality: N/A;  . Percutaneous coronary stent  intervention (pci-s)  04/05/2012    Procedure: PERCUTANEOUS CORONARY STENT INTERVENTION (PCI-S);  Surgeon: Robynn Pane, MD;  Location: [facility] CATH LAB;  Service: Cardiovascular;;  des RCA    Family History  Problem Relation Age of Onset  . Heart attack Mother   . Heart failure Mother   . Stomach cancer Father    Social History:  reports that she has quit smoking. She has never used smokeless tobacco. She reports that she does not drink alcohol or use illicit drugs.  Allergies:  Allergies  Allergen Reactions  . Penicillins Nausea And Vomiting    Medications:  Current Facility-Administered Medications  Medication Dose Route Frequency Provider Last Rate Last Dose  . alteplase (ACTIVASE) 1 mg/mL infusion 48 mg  48 mg Intravenous Once Rejeana Brock, MD 48 mL/hr at 07/14/2015 1400 48 mg at 07/27/2015 1400   Current Outpatient Prescriptions  Medication Sig Dispense Refill  . aspirin 81 MG EC tablet Take 81 mg by mouth daily.    Marland Kitchen atorvastatin (LIPITOR) 20 MG tablet Take 20 mg by mouth daily.     . carvedilol (COREG) 3.125 MG tablet Take 3.125 mg by mouth 2 (two) times daily with a meal.    . clopidogrel (PLAVIX) 75 MG tablet Take 75 mg by mouth daily.    Marland Kitchen FLUoxetine (PROZAC) 10 MG capsule Take 10 mg by mouth daily.    . pantoprazole (PROTONIX) 40 MG tablet Take 40 mg by mouth daily.    . calcium-vitamin D (OSCAL WITH D) 500-200 MG-UNIT per tablet Take 1 tablet by mouth daily with breakfast.    . ferrous sulfate 325 (65 FE) MG tablet Take 325 mg by mouth 3 (three) times daily with meals.    . nitroGLYCERIN (NITROSTAT) 0.4 MG SL tablet Place 0.4 mg under the tongue every 5 (five) minutes x 3 doses as needed for chest pain.       ROS:                                                                                                                                        History obtained from daughter  General ROS: negative for - chills, fatigue, fever, night sweats, weight gain or weight loss Psychological ROS: negative for - behavioral disorder, hallucinations, memory difficulties, mood swings or suicidal ideation Ophthalmic ROS: negative for - blurry vision, double vision, eye pain or loss of vision ENT ROS: negative for - epistaxis, nasal discharge, oral lesions, sore throat, tinnitus or vertigo Allergy and Immunology ROS: negative for - hives or itchy/watery eyes Hematological and Lymphatic ROS: negative for - bleeding problems, bruising or swollen lymph nodes Endocrine ROS: negative for - galactorrhea, hair pattern changes, polydipsia/polyuria or temperature intolerance Respiratory ROS: negative for - cough, hemoptysis, shortness of breath or wheezing Cardiovascular ROS: negative for - chest pain, dyspnea on exertion, edema or irregular heartbeat Gastrointestinal ROS: negative for - abdominal pain, diarrhea, hematemesis, nausea/vomiting or stool incontinence Genito-Urinary ROS: negative for - dysuria, hematuria, incontinence or urinary frequency/urgency Musculoskeletal ROS: negative for - joint swelling or muscular weakness Neurological ROS: as noted in HPI Dermatological ROS: negative for rash and skin lesion changes  Neurologic Examination:  There were no vitals taken for this visit.  HEENT-  Normocephalic, no lesions, without obvious abnormality.  Normal external eye and conjunctiva.  Normal TM's bilaterally.  Normal auditory canals and external ears. Normal external nose, mucus membranes and septum.  Normal pharynx. Cardiovascular- S1, S2 normal, pulses palpable throughout   Lungs- chest clear, no wheezing, rales, normal symmetric air entry Abdomen- normal findings: bowel sounds normal Extremities- no edema Lymph-no adenopathy  palpable Musculoskeletal-no joint tenderness, deformity or swelling Skin-warm and dry, no hyperpigmentation, vitiligo, or suspicious lesions  Neurological Examination Mental Status: Alert, unintelligible speech. Follows no commands. Cranial Nerves: II: right hemianopsia, right pupil 2 mm and sluggish, left pupil horizontal irriegularity from surgery and nonreactive III,IV, VI: ptosis not present, left gaze preference but crosses midline V,VII: right asymmetric,  VIII: hearing normal bilaterally IX,X: unable to assess XI: right side plegia to shoulder shrug XII: midline tongue extension Motor: Right : Upper extremity   2/5    Left:     Upper extremity   5/5  Lower extremity   0/5     Lower extremity   3/5 Tone and bulk:normal tone throughout; no atrophy noted Sensory: left intact with decreased sensation ion the right and neglect of the right side.  Deep Tendon Reflexes: 3+ in right UE and 2+ Left UE, No KJ or AJ Plantars: Right: upgoing  Left: downgoing Cerebellar: Unable to assess Gait: not tested   Lab Results: Basic Metabolic Panel:  Recent Labs Lab 07/15/2015 1356  NA 141  K 4.8  CL 109  GLUCOSE 118*  BUN 36*  CREATININE 3.00*    Liver Function Tests: No results for input(s): AST, ALT, ALKPHOS, BILITOT, PROT, ALBUMIN in the last 168 hours. No results for input(s): LIPASE, AMYLASE in the last 168 hours. No results for input(s): AMMONIA in the last 168 hours.  CBC:  Recent Labs Lab 07/30/2015 1346 08/04/2015 1356  WBC 6.9  --   NEUTROABS 3.2  --   HGB 9.5* 10.9*  HCT 29.5* 32.0*  MCV 87.8  --   PLT 281  --     Cardiac Enzymes: No results for input(s): CKTOTAL, CKMB, CKMBINDEX, TROPONINI in the last 168 hours.  Lipid Panel: No results for input(s): CHOL, TRIG, HDL, CHOLHDL, VLDL, LDLCALC in the last 168 hours.  CBG: No results for input(s): GLUCAP in the last 168 hours.  Microbiology: Results for orders placed or performed during the hospital encounter  of 06/15/13  Urine culture     Status: None   Collection Time: 06/15/13  1:39 PM  Result Value Ref Range Status   Specimen Description URINE, CATHETERIZED  Final   Special Requests NONE  Final   Culture  Setup Time   Final    06/15/2013 18:51 Performed at [facility]   Colony Count NO GROWTH Performed at [facility]  Final   Culture NO GROWTH Performed at [facility]  Final   Report Status 06/16/2013 FINAL  Final    Coagulation Studies: No results for input(s): LABPROT, INR in the last 72 hours.  Imaging: Ct Head Code Stroke W/o Cm  07/24/2015  CLINICAL DATA:  70 year old female code stroke. Right side weakness. Patient on Plavix. Initial encounter. EXAM: CT HEAD WITHOUT CONTRAST TECHNIQUE: Contiguous axial images were obtained from the base of the skull through the vertex without intravenous contrast. COMPARISON:  Head CT without contrast 04/15/2013. FINDINGS: Positive for a hyperdense left MCA M1 segment and proximal posterior left M2 branch as seen  on series 21, images 10 and 11. There is hypodensity in the left MCA territory in keeping with acute cortically based infarct. ASPECTS score = 6 (points deducted for changes in the insula, M1, M2, and M5 segments). [place] Stroke Program Early CT Score Normal score = 10 No associated intracranial mass effect. No acute intracranial hemorrhage. No ventriculomegaly. Basilar cisterns remain patent. Chronic lacunar infarcts in both thalami. Chronic left occipital lobe encephalomalacia stable gray-white matter differentiation outside of the left MCA territory. Paranasal sinuses and mastoids today are clear. No acute osseous abnormality identified. No acute orbit or scalp soft tissue findings. Calcified atherosclerosis at the skullbase. IMPRESSION: 1. Positive for hyperdense left MCA M1/bifurcation, and changes of cytotoxic edema in the left MCA territory. ASPECTS score = 6. 2. No associated hemorrhage or mass effect. 3. Chronic  left PCA and bilateral thalamostriate artery territory ischemia. Electronically Signed   By: Odessa FlemingH  Hall M.D.   On: 07/15/2015 14:00       Assessment and plan discussed with with attending physician and they are in agreement.    Felicie MornDavid Smith PA-C [facility] [phone]  07/20/2015, 2:09 PM   Assessment: 70 y.o. female with a history of previous stroke causing right sided weakness from small subcortical infarcts, now with large left MCA infarct with aphasia and right hemianopia. Given previous strokes, could be large vessel athero, but also could be embolic. She received IV tPA and is currently getting gIA intervention. Though her MRS was 4, she still had quality of life per family, and given that aphasia is her primary symptoms and she is only 3 hours from her onset, decision to proceed was made.   Stroke Risk Factors - diabetes mellitus and CAD  1. HgbA1c, fasting lipid panel 2. MRI, MRA  of the brain without contrast 3. Frequent neuro checks 4. Echocardiogram 5. Carotid dopplers 6. Prophylactic therapy-Antiplatelet med: Aspirin - dose 325mg  PO or 300mg  PR 7. Risk factor modification 8. Telemetry monitoring 9. PT consult, OT consult, Speech consult 10. Gentle hydration for elevated creatinine(looks chronic) 11. SSI for DM 12. please page stroke NP  Or  PA  Or MD  M-F from 8am -4 pm starting 7/21 as this patient will be followed by the stroke team at this point.   You can look them up on [email or web address]     This patient is critically ill and at significant risk of neurological worsening, death and care requires constant monitoring of vital signs, hemodynamics,respiratory and cardiac monitoring, neurological assessment, discussion with family, other specialists and medical decision making of high complexity. I spent 60 minutes of neurocritical care time  in the care of  this patient.  Ritta SlotMcNeill Kirkpatrick, MD [facility] [phone]  If 7pm- 7am, please page  neurology on call as listed in AMION. 07/10/2015  6:28 PM

## 2015-07-25 NOTE — Progress Notes (Signed)
At shift change 1930 noticed the gauze at sheath site had moderate amount of blood on dressing. Pulses intact, placed pressure dressing over sheath dressing and placed gauze between legs. No hematoma felt.  At 2100 noticed gauze dressing was completely saturated and gauze between legs was also moderately saturated but no hematoma at site. Called Deveshwar, peeled back dressing per his order and blood was oozing at site. Placed new gauze over site and held pressure 10 minutes. Placed sandbag over site per Deveshwars order. Will continue to monitor.

## 2015-07-26 ENCOUNTER — Inpatient Hospital Stay (HOSPITAL_COMMUNITY): Payer: Medicare HMO

## 2015-07-26 ENCOUNTER — Encounter (HOSPITAL_COMMUNITY): Payer: Self-pay | Admitting: Certified Registered"

## 2015-07-26 ENCOUNTER — Encounter (HOSPITAL_COMMUNITY): Payer: Self-pay | Admitting: Interventional Radiology

## 2015-07-26 DIAGNOSIS — E785 Hyperlipidemia, unspecified: Secondary | ICD-10-CM

## 2015-07-26 DIAGNOSIS — I639 Cerebral infarction, unspecified: Secondary | ICD-10-CM | POA: Insufficient documentation

## 2015-07-26 DIAGNOSIS — I638 Other cerebral infarction: Secondary | ICD-10-CM

## 2015-07-26 DIAGNOSIS — I69322 Dysarthria following cerebral infarction: Secondary | ICD-10-CM

## 2015-07-26 DIAGNOSIS — Z978 Presence of other specified devices: Secondary | ICD-10-CM | POA: Insufficient documentation

## 2015-07-26 DIAGNOSIS — J9600 Acute respiratory failure, unspecified whether with hypoxia or hypercapnia: Secondary | ICD-10-CM | POA: Insufficient documentation

## 2015-07-26 DIAGNOSIS — Q251 Coarctation of aorta: Secondary | ICD-10-CM | POA: Insufficient documentation

## 2015-07-26 DIAGNOSIS — R569 Unspecified convulsions: Secondary | ICD-10-CM

## 2015-07-26 DIAGNOSIS — Z8673 Personal history of transient ischemic attack (TIA), and cerebral infarction without residual deficits: Secondary | ICD-10-CM

## 2015-07-26 DIAGNOSIS — I69351 Hemiplegia and hemiparesis following cerebral infarction affecting right dominant side: Secondary | ICD-10-CM

## 2015-07-26 DIAGNOSIS — I63512 Cerebral infarction due to unspecified occlusion or stenosis of left middle cerebral artery: Secondary | ICD-10-CM | POA: Insufficient documentation

## 2015-07-26 LAB — GLUCOSE, CAPILLARY
Glucose-Capillary: 125 mg/dL — ABNORMAL HIGH (ref 65–99)
Glucose-Capillary: 110 mg/dL — ABNORMAL HIGH (ref 65–99)
Glucose-Capillary: 106 mg/dL — ABNORMAL HIGH (ref 65–99)
Glucose-Capillary: 98 mg/dL (ref 65–99)
Glucose-Capillary: 99 mg/dL (ref 65–99)

## 2015-07-26 LAB — POCT I-STAT 3, ART BLOOD GAS (G3+)
ACID-BASE DEFICIT: 4 mmol/L — AB (ref 0.0–2.0)
BICARBONATE: 22.5 meq/L (ref 20.0–24.0)
O2 SAT: 100 %
PO2 ART: 452 mmHg — AB (ref 80.0–100.0)
TCO2: 24 mmol/L (ref 0–100)
pCO2 arterial: 48.9 mmHg — ABNORMAL HIGH (ref 35.0–45.0)
pH, Arterial: 7.271 — ABNORMAL LOW (ref 7.350–7.450)

## 2015-07-26 LAB — LIPID PANEL
CHOLESTEROL: 135 mg/dL (ref 0–200)
HDL: 44 mg/dL (ref >40)
LDL CALC: 80 mg/dL (ref 0–99)
TRIGLYCERIDES: 56 mg/dL (ref <150)
Total CHOL/HDL Ratio: 3.1 RATIO
VLDL: 11 mg/dL (ref 0–40)

## 2015-07-26 LAB — BLOOD GAS, ARTERIAL
Acid-base deficit: 3.7 mmol/L — ABNORMAL HIGH (ref 0.0–2.0)
BICARBONATE: 19.8 meq/L — AB (ref 20.0–24.0)
Drawn by: 364961
FIO2: 0.4
LHR: 18 {breaths}/min
O2 Saturation: 99.4 %
PCO2 ART: 29.6 mmHg — AB (ref 35.0–45.0)
PEEP: 5 cmH2O
PH ART: 7.44 (ref 7.350–7.450)
PO2 ART: 171 mmHg — AB (ref 80.0–100.0)
Patient temperature: 98.6
TCO2: 20.7 mmol/L (ref 0–100)
VT: 500 mL

## 2015-07-26 LAB — CBC WITH DIFFERENTIAL/PLATELET
BASOS ABS: 0 10*3/uL (ref 0.0–0.1)
BASOS PCT: 0 %
Eosinophils Absolute: 0 10*3/uL (ref 0.0–0.7)
Eosinophils Relative: 0 %
HEMATOCRIT: 22.3 % — AB (ref 36.0–46.0)
HEMOGLOBIN: 7.2 g/dL — AB (ref 12.0–15.0)
LYMPHS PCT: 11 %
Lymphs Abs: 1.1 10*3/uL (ref 0.7–4.0)
MCH: 27.9 pg (ref 26.0–34.0)
MCHC: 32.3 g/dL (ref 30.0–36.0)
MCV: 86.4 fL (ref 78.0–100.0)
MONO ABS: 0.6 10*3/uL (ref 0.1–1.0)
Monocytes Relative: 6 %
NEUTROS ABS: 8.5 10*3/uL — AB (ref 1.7–7.7)
NEUTROS PCT: 83 %
Platelets: 212 10*3/uL (ref 150–400)
RBC: 2.58 MIL/uL — ABNORMAL LOW (ref 3.87–5.11)
RDW: 13.4 % (ref 11.5–15.5)
WBC: 10.2 10*3/uL (ref 4.0–10.5)

## 2015-07-26 LAB — CBC
HCT: 23.1 % — ABNORMAL LOW (ref 36.0–46.0)
Hemoglobin: 7.5 g/dL — ABNORMAL LOW (ref 12.0–15.0)
MCH: 27.9 pg (ref 26.0–34.0)
MCHC: 32.5 g/dL (ref 30.0–36.0)
MCV: 85.9 fL (ref 78.0–100.0)
Platelets: 197 K/uL (ref 150–400)
RBC: 2.69 MIL/uL — ABNORMAL LOW (ref 3.87–5.11)
RDW: 13.6 % (ref 11.5–15.5)
WBC: 13.9 K/uL — ABNORMAL HIGH (ref 4.0–10.5)

## 2015-07-26 LAB — BASIC METABOLIC PANEL WITH GFR
Anion gap: 6 (ref 5–15)
BUN: 32 mg/dL — ABNORMAL HIGH (ref 6–20)
CO2: 21 mmol/L — ABNORMAL LOW (ref 22–32)
Calcium: 8.7 mg/dL — ABNORMAL LOW (ref 8.9–10.3)
Chloride: 111 mmol/L (ref 101–111)
Creatinine, Ser: 2.67 mg/dL — ABNORMAL HIGH (ref 0.44–1.00)
GFR calc Af Amer: 20 mL/min — ABNORMAL LOW (ref >60)
GFR calc non Af Amer: 17 mL/min — ABNORMAL LOW (ref >60)
Glucose, Bld: 134 mg/dL — ABNORMAL HIGH (ref 65–99)
Potassium: 5 mmol/L (ref 3.5–5.1)
Sodium: 138 mmol/L (ref 135–145)

## 2015-07-26 MED ORDER — FAMOTIDINE IN NACL 20-0.9 MG/50ML-% IV SOLN
20.0000 mg | INTRAVENOUS | Status: DC
  Administered 2015-07-27 – 2015-07-31 (×5): 20 mg via INTRAVENOUS
  Filled 2015-07-26 (×5): qty 50

## 2015-07-26 MED ORDER — SODIUM CHLORIDE 0.9 % IV SOLN
1000.0000 mg | Freq: Two times a day (BID) | INTRAVENOUS | Status: DC
  Administered 2015-07-26 – 2015-07-28 (×7): 1000 mg via INTRAVENOUS
  Filled 2015-07-26 (×9): qty 10

## 2015-07-26 MED ORDER — ANTISEPTIC ORAL RINSE SOLUTION (CORINZ)
7.0000 mL | OROMUCOSAL | Status: DC
  Administered 2015-07-26 – 2015-07-31 (×53): 7 mL via OROMUCOSAL

## 2015-07-26 MED ORDER — CHLORHEXIDINE GLUCONATE 0.12% ORAL RINSE (MEDLINE KIT)
15.0000 mL | Freq: Two times a day (BID) | OROMUCOSAL | Status: DC
  Administered 2015-07-26 – 2015-07-31 (×11): 15 mL via OROMUCOSAL

## 2015-07-26 MED ORDER — SODIUM CHLORIDE 0.9 % IV SOLN: Freq: Once | INTRAVENOUS | Status: AC

## 2015-07-26 MED ORDER — LORAZEPAM 2 MG/ML IJ SOLN
2.0000 mg | INTRAMUSCULAR | Status: DC | PRN
  Administered 2015-07-26 – 2015-07-28 (×3): 2 mg via INTRAVENOUS
  Filled 2015-07-26 (×2): qty 1

## 2015-07-26 MED ORDER — WHITE PETROLATUM GEL
Status: AC
  Administered 2015-07-27: 0.2
  Filled 2015-07-26: qty 1

## 2015-07-26 NOTE — Care Management Note (Signed)
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  PTA, pt resided at home with spouse.                   Action/Plan: Pt currently remains sedated and on ventilator; will follow for discharge planning as pt progresses.    Expected Discharge Date:                  Expected Discharge Plan:  IP Rehab Facility  In-House Referral:     Discharge planning Services  CM Consult  Post Acute Care Choice:    Choice offered to:     DME Arranged:    DME Agency:     HH Arranged:    HH Agency:     Status of Service:  In process, will continue to follow  If discussed at Long Length of Stay Meetings, dates discussed:    Additional Comments:  Quintella BatonJulie W. Justene Jensen, RN, BSN  Trauma/Neuro ICU Case Manager [phone]

## 2015-07-26 NOTE — Progress Notes (Signed)
245cc fentanyl from bag wasted in sink witnessed by Philis NettleLisa Williams, RN and [place] Greig Altergott, RN

## 2015-07-26 NOTE — Progress Notes (Signed)
2000- pt doing well, FC, MAE, nodding appropriately to family members, Dr Roxy Mannsster rounding and updated family members on patients condition that we would continue to monitor her and get a 24 hr scan. NIH better, now a 13   2200- patient vomited 1x, ETT had been retracted by RT per xray results. Neuro status unchanged, pt more awake and gagging at tube so started fent gtt at low dose 125mcg/hr 2300- pt vomited again, given zofran, would no longer FC,  MAE to stimulation. called Dr Roxy Mannsster, Order received for CT head.  0000- Dr Roxy Mannsster notified that patients CT showed ICH and that she had a 15 sec seizure in CT (R arm twitching)  0030-Pt had another seizure, witnessed by Dr Roxy Mannsster, ordered to give 2mg  ativan. Seizure lasted ~30sec. Keppra ordered. Dr Roxy Mannsster called and updated pts daughter that patients condition worsened and would be good for family to come in to see her and discuss plan of care. FFP ordered for reversal 0100- Family arrived to bedside, everyone updated on severity of pts condition, code status discussed and they wish to keep her full code and and continue current treatment. Chaplain at bedside for spiritual and emotional support as husband and children visibly upset about pts deteriorating condition.   Will continue to monitor. Family went home around 0230 and to return in morning for updates.  Lucynda Rosano, [place] Marie, RN

## 2015-07-26 NOTE — Progress Notes (Signed)
Referring Physician(s): Code Stroke  Supervising Physician: Julieanne Cottoneveshwar, Sanjeev  Patient Status:  Inpatient  Chief Complaint:  F/U after revascularization of LT MCA,LT ACA and LT ICA terminus by Dr. Corliss Skainseveshwar 07/20/2015  HPI:  [name] is a 70 yo female who was found to be less responsive and weak on her right side by family  2 hours prior to presentation to the ED yesterday.  She was brought to the ED and was noted to have slurred speech and right sided hemineglect and weakness.  A code stroke was called.   CT performed and was concerning for left MCA ischemic stroke.   TPA was subsequently given.   Neurology decided to proceed with endovascular intervention with Dr. Corliss Skainseveshwar to retrieve the clot. This was performed and revascularization and reperfusion was achieved.   She was placed on the vent during the procedure.  Around midnight neurology was notified by RN that patient had several episodes of emesis.   She had a brief GTC seizure after returning to the unit, lasting perhaps 30 seconds.   Emergent CT scan was obtained which revealed acute intracranial hemorrhage with a large R pericallosal hemorrhage (4 X 2.8 cm) with resultant mass effect on the frontal horns of the lateral ventricles.   There was significant subarachnoid hemorrhage largely confined to the parasagittal frontal lobes.   Additional hemorrhage is noted in the left occipital region in an area of prior infarction.   Vasogenic edema is noted around the hemorrhage. There is no shift or hydrocephalus.   Patient was given FFP.   Subjective:  Pt intubated on Vent. One family member at bedside.  Nurse in the room filled me in on events over night.  Allergies: Penicillins  Medications: Prior to Admission medications   Medication Sig Start Date End Date Taking? Authorizing Provider  aspirin 81 MG EC tablet Take 81 mg by mouth daily. 04/07/12  Yes Rinaldo CloudMohan Harwani, MD  atorvastatin (LIPITOR) 20 MG  tablet Take 20 mg by mouth daily.    Yes Historical Provider, MD  carvedilol (COREG) 3.125 MG tablet Take 3.125 mg by mouth 2 (two) times daily with a meal. 04/20/13  Yes Rhetta MuraJai-Gurmukh Samtani, MD  clopidogrel (PLAVIX) 75 MG tablet Take 75 mg by mouth daily.   Yes Historical Provider, MD  FLUoxetine (PROZAC) 10 MG capsule Take 10 mg by mouth daily.   Yes Historical Provider, MD  pantoprazole (PROTONIX) 40 MG tablet Take 40 mg by mouth daily.   Yes Historical Provider, MD  calcium-vitamin D (OSCAL WITH D) 500-200 MG-UNIT per tablet Take 1 tablet by mouth daily with breakfast.    Historical Provider, MD  ferrous sulfate 325 (65 FE) MG tablet Take 325 mg by mouth 3 (three) times daily with meals. 04/07/12   Rinaldo CloudMohan Harwani, MD  nitroGLYCERIN (NITROSTAT) 0.4 MG SL tablet Place 0.4 mg under the tongue every 5 (five) minutes x 3 doses as needed for chest pain. 04/07/12   Rinaldo CloudMohan Harwani, MD     Vital Signs: BP 118/57 mmHg  Pulse 98  Temp(Src) 102.2 F (39 C) (Oral)  Resp 11  Ht 5\' 6"  (1.676 m)  Wt 118 lb 9.7 oz (53.8 kg)  BMI 19.15 kg/m2  SpO2 100%  Physical Exam  Remains intubated on vent Groin with sheath still in place. Dressing in place. No further bleeding. Unable to adequately assess neurologic status secondary to sedation.  Imaging: Ct Head Wo Contrast  07/26/2015  ADDENDUM REPORT: 07/26/2015 07:38 ADDENDUM: Critical Value/emergent results were called  by telephone at the time of interpretation on 07/26/2015 at 0728 to RN [place] who verbally acknowledged these results. Electronically Signed   By: Odessa Fleming M.D.   On: 07/26/2015 07:38  07/26/2015  CLINICAL DATA:  70 year old female code stroke status post neuro intervention yesterday. Acute intracranial hemorrhage. Initial encounter. EXAM: CT HEAD WITHOUT CONTRAST TECHNIQUE: Contiguous axial images were obtained from the base of the skull through the vertex without intravenous contrast. COMPARISON:  07/23/2015 at 2357 hours, and earlier.  FINDINGS: Stable visualized osseous structures. Stable paranasal sinuses and mastoids. Stable orbit and scalp soft tissues. Hyperdense pericallosal intra-axial hemorrhage with bilateral subarachnoid extension. Estimated intra-axial hemorrhage size 57 x 40 x 37 mm (AP by transverse by CC). This compares to 51 x 35 x 34 mm earlier. Anterior mass effect on the lateral ventricles and septum pellucidum. There also appears to be intra-axial hemorrhage in the left occipital lobe, site of previous left PCA encephalomalacia. No intraventricular extension. Basilar cisterns remain patent. Mild leftward midline shift. No ventriculomegaly. Hypodensity in the posterior left basal ganglia, left insula, and anterior operculum (series 5, image 46). The remainder of the left MCA territory demonstrates relatively preserved gray-white matter differentiation. No new cortically based infarct identified. Chronic lacunar infarcts in the thalami and left midbrain again noted. IMPRESSION: 1. Mild progression of intra-axial pericallosal hemorrhage since 2357 hours yesterday. Intra-axial blood volume there estimated at 42 mL (versus 30 mL previously). Anterior mass effect on the lateral ventricles and septum pellucidum. 2. Bilateral subarachnoid hemorrhage extension. No intraventricular extension. No ventriculomegaly. 3. Probable small volume of intra-axial hemorrhage also in the encephalomalacia left occipital lobe. 4. Left MCA territory infarct appears fairly limited to the left basal ganglia, insula, and anterior operculum. 5. Underlying chronic small vessel disease. Electronically Signed: By: Odessa Fleming M.D. On: 07/26/2015 07:23   Ct Head Wo Contrast  07/26/2015  CLINICAL DATA:  Known left MCA stroke with stroke intervention. EXAM: CT HEAD WITHOUT CONTRAST TECHNIQUE: Contiguous axial images were obtained from the base of the skull through the vertex without intravenous contrast. COMPARISON:  July 25, 2015 FINDINGS: Paranasal sinuses,  mastoid air cells, and bones are within normal limits. There is a right pericallosal hemorrhage measuring 4 x 2.8 cm. There is a large amount of subarachnoid hemorrhage, mostly at the midline extending along both sides of the falx. No subdural blood identified at this time. The pericallosal hemorrhage splays the frontal horns of the lateral ventricles. There is partial effacement of the quadrigeminal plate cistern. The suprasellar cistern remains patent. There is no hydrocephalus or change in ventricular caliber at this time. The cerebellum and brainstem are unremarkable. Lacunar infarcts are seen in the bilateral thalami. There is an infarct of the left occipital lobe which is stable as well. There is some subarachnoid hemorrhage in the region of previous left occipital lobe infarct. There is developing infarct along the anterior horn of the left temporal lobe. However, the cortex and much of the left MCA distribution remains intact. There is still increased density in the left MCA versus the right. IMPRESSION: 1. There is a large right pericallosal hemorrhage, likely due to a hemorrhagic infarct. There is a large amount of subarachnoid hemorrhage, mostly at the midline along both sides of the falx. There is new partial effacement of the quadrigeminal plate cistern. No hydrocephalus. 2. Developing infarct along the anterior horn of the left temporal lobe. The remainder of the left MCA territory cortex appears to be intact. 3. Increased density remains in  the left MCA versus the right. Findings called to Dr. Roxy Manns Electronically Signed   By: Gerome Sam III M.D   On: 07/26/2015 00:19   Ct Head Wo Contrast  2015-08-19  CLINICAL DATA:  Acute left MCA infarct, left middle cerebral artery embolism, status post stroke intervention. EXAM: CT HEAD WITHOUT CONTRAST TECHNIQUE: Contiguous axial images were obtained from the base of the skull through the vertex without intravenous contrast. COMPARISON:  August 19, 2015  FINDINGS: Brain: Diffuse sulcal effacement and loss of gray-white differentiation throughout the left cerebral hemisphere MCA territory compatible with an acute left MCA infarct. Diffuse vascular enhancement noted from the recent neuro intervention. Hyper attenuation remains of the left MCA. No extra-axial hemorrhage or subarachnoid hemorrhage. No midline shift, herniation or hydrocephalus. Cisterns are patent. No cerebellar abnormality. Remote left occipital PCA territory infarct with encephalomalacia. Vascular: Persistent hyper attenuation of the left MCA. Skull: Negative for fracture or focal lesion. Sinuses/Orbits: No acute findings. Other: None. IMPRESSION: Early CT findings of a large diffuse left cerebral hemisphere MCA infarct involving the frontal, temporal and parietal lobes with sulcal effacement and loss of gray-white differentiation. Persistent hyper attenuation of the left MCA vasculature. No acute intracranial hemorrhage. Electronically Signed   By: Judie Petit.  Shick M.D.   On: 08-19-2015 17:25   Dg Chest Port 1 View  Aug 19, 2015  CLINICAL DATA:  Repositioning of endotracheal tube. EXAM: PORTABLE CHEST 1 VIEW COMPARISON:  08-19-15 FINDINGS: Endotracheal tube has been repositioned with tip now located 4.3 cm above the carina. Cardiomediastinal silhouette is normal. Mediastinal contours appear intact. There is no evidence of pleural effusion or pneumothorax. There is improved aeration of the left lung base. There is mild pulmonary vascular congestion. Osseous structures are without acute abnormality. Soft tissues are grossly normal. IMPRESSION: Interval repositioning of the endotracheal tube which is now in satisfactory radiographic position. Mild pulmonary vascular congestion. Electronically Signed   By: Ted Mcalpine M.D.   On: 2015-08-19 21:21   Dg Chest Port 1 View  2015-08-19  CLINICAL DATA:  Intubation. EXAM: PORTABLE CHEST 1 VIEW COMPARISON:  Chest radiograph 06/15/2013 FINDINGS: There has  been interval intubation with the endotracheal tube terminating in the proximal right mainstem bronchus. Retraction by 4.5 cm is recommended. Cardiomediastinal silhouette is normal. Mediastinal contours appear intact. There is no evidence of pneumothorax. Left lower lobe airspace consolidation and/or left pleural effusion are seen. Increased interstitial markings. Osseous structures are without acute abnormality. Soft tissues are grossly normal. IMPRESSION: Interval intubation with endotracheal tube within the proximal right mainstem bronchus. Retraction by 4.5 cm is recommended. Left lower lobe airspace consolidation versus atelectasis with small left pleural effusion. Electronically Signed   By: Ted Mcalpine M.D.   On: Aug 19, 2015 20:03   Ct Head Code Stroke W/o Cm  August 19, 2015  ADDENDUM REPORT: 08-19-15 14:11 ADDENDUM: Study discussed by telephone with Dr. Ritta Slot on Aug 19, 2015 at 1403 hours. Electronically Signed   By: Odessa Fleming M.D.   On: 19-Aug-2015 14:11  08-19-2015  CLINICAL DATA:  70 year old female code stroke. Right side weakness. Patient on Plavix. Initial encounter. EXAM: CT HEAD WITHOUT CONTRAST TECHNIQUE: Contiguous axial images were obtained from the base of the skull through the vertex without intravenous contrast. COMPARISON:  Head CT without contrast 04/15/2013. FINDINGS: Positive for a hyperdense left MCA M1 segment and proximal posterior left M2 branch as seen on series 21, images 10 and 11. There is hypodensity in the left MCA territory in keeping with acute cortically based infarct. ASPECTS score = 6 (points  deducted for changes in the insula, M1, M2, and M5 segments). [place] Stroke Program Early CT Score Normal score = 10 No associated intracranial mass effect. No acute intracranial hemorrhage. No ventriculomegaly. Basilar cisterns remain patent. Chronic lacunar infarcts in both thalami. Chronic left occipital lobe encephalomalacia stable gray-white matter differentiation  outside of the left MCA territory. Paranasal sinuses and mastoids today are clear. No acute osseous abnormality identified. No acute orbit or scalp soft tissue findings. Calcified atherosclerosis at the skullbase. IMPRESSION: 1. Positive for hyperdense left MCA M1/bifurcation, and changes of cytotoxic edema in the left MCA territory. ASPECTS score = 6. 2. No associated hemorrhage or mass effect. 3. Chronic left PCA and bilateral thalamostriate artery territory ischemia. Electronically Signed: By: Odessa Fleming M.D. On: 07/15/2015 14:00    Labs:  CBC:  Recent Labs  07/18/2015 1346 07/10/2015 1356 07/26/15 0320  WBC 6.9  --  10.2  HGB 9.5* 10.9* 7.2*  HCT 29.5* 32.0* 22.3*  PLT 281  --  212    COAGS:  Recent Labs  07/22/2015 1346  INR 1.11  APTT 21*    BMP:  Recent Labs  07/29/2015 1346 07/07/2015 1356 07/26/15 0320  NA 139 141 138  K 4.8 4.8 5.0  CL 113* 109 111  CO2 21*  --  21*  GLUCOSE 123* 118* 134*  BUN 36* 36* 32*  CALCIUM 8.6*  --  8.7*  CREATININE 2.97* 3.00* 2.67*  GFRNONAA 15*  --  17*  GFRAA 17*  --  20*    LIVER FUNCTION TESTS:  Recent Labs  07/18/2015 1346  BILITOT 0.3  AST 18  ALT 11*  ALKPHOS 89  PROT 7.0  ALBUMIN 3.5    Assessment and Plan:  Stroke = S/P revascularization of LT MCA,LT ACA and LT ICA terminus by Dr. Corliss Skains 07/16/2015  Subsequent Intracranial hemorrhage with both intraparenchymal and subarachnoid components following the administration of IV tPA and thrombectomy.  Will plan to remove sheath today, orders in place.  Continue care per Neuro and CCM.   Electronically Signed: Gwynneth Macleod PA-C 07/26/2015, 10:17 AM   I spent a total of 15 Minutes at the the patient's bedside AND on the patient's hospital floor or unit, greater than 50% of which was counseling/coordinating care for f/u after intracranial revascularization.

## 2015-07-26 NOTE — Progress Notes (Signed)
9Fr Rt femoral sheath removed at 1215 using v-pad and manual pressure.  Hemostasis obtained at 1240.  Site soft with no complications or hematoma. Site reviewed with Medical laboratory scientific officerJosh RN.  Pressure dressing applied.  Distal pulses intact and palpable.

## 2015-07-26 NOTE — Progress Notes (Signed)
STROKE TEAM PROGRESS NOTE   HISTORY OF PRESENT ILLNESS (per record) [name] is an 70 y.o. 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 Today 07/29/2015 she was seen normal at 1200. Her daughter went out to get her a sausage biscuit and when she returned she tried to sit her mother up. When she did this her mother slumped to the right and could not keep upright. She was not talking clearly. This was very different from her baseline and EMS was called. On arrival CT showed a clear left MCA sign and tPA was given. She did not receive a CTA due to elevated creatinine and was taken directly to IR where she received TICI3 reperfusion of the near occlusion of L ICA terminus and L ACA and complete occlusion of L MCA using 2 passes of the Solitaire device.  She was admitted to the neuro ICU for further evaluation and treatment.  Modified Rankin: Rankin Score=4.    SUBJECTIVE (INTERVAL HISTORY) Her son and husband arrived during rounds. This am pt still intuabted, unresponsive but not in acute distress, off sedation. Pt had ICH overnight, complicated by seizure.   Pt had head CT post IR and no ICH at that time. Later, pt develop vomiting and seizure, repeat CT showed frontal ICH with SDH and SAH. Kept on keppra and BP control.  As per nursing staff, pt arrived in neuro ICU after IR procedure yesterday evening with albumin and neo hanging along with IVF, and her BP from A-line with good waveform was 220s. She was put on cardene and her BP back down within 120-140 quickly. As per flowsheet, she had one time BP high at 5:16pm at 184/115 but quickly done to 91/62 in .    OBJECTIVE Temp:  [98.2 F (36.8 C)-102.3 F (39.1 C)] 102.2 F (39 C) (07/21 0800) Pulse Rate:  [39-130] 98 (07/21 0700) Cardiac Rhythm:  [-] Normal sinus rhythm (07/21 0400) Resp:  [4-27] 11 (07/21 0700) BP:  (91-184)/(47-115) 118/57 mmHg (07/21 0700) SpO2:  [94 %-100 %] 100 % (07/21 0737) Arterial Line BP: (95-213)/(57-124) 136/63 mmHg (07/21 0700) FiO2 (%):  [40 %-100 %] 40 % (07/21 0737) Weight:  [53.8 kg (118 lb 9.7 oz)] 53.8 kg (118 lb 9.7 oz) (07/20 1355)  CBC:  Recent Labs Lab 07/12/2015 1346 08/05/2015 1356 07/26/15 0320  WBC 6.9  --  10.2  NEUTROABS 3.2  --  8.5*  HGB 9.5* 10.9* 7.2*  HCT 29.5* 32.0* 22.3*  MCV 87.8  --  86.4  PLT 281  --  212    Basic Metabolic Panel:  Recent Labs Lab 07/20/2015 1346 07/22/2015 1356 07/26/15 0320  NA 139 141 138  K 4.8 4.8 5.0  CL 113* 109 111  CO2 21*  --  21*  GLUCOSE 123* 118* 134*  BUN 36* 36* 32*  CREATININE 2.97* 3.00* 2.67*  CALCIUM 8.6*  --  8.7*    Lipid Panel:    Component Value Date/Time   CHOL 135 07/26/2015 0320   TRIG 56 07/26/2015 0320   HDL 44 07/26/2015 0320   CHOLHDL 3.1 07/26/2015 0320   VLDL 11 07/26/2015 0320   LDLCALC 80 07/26/2015 0320   HgbA1c:  Lab Results  Component Value Date   HGBA1C 5.6 04/16/2013   Urine Drug Screen:    Component Value Date/Time   LABOPIA NONE DETECTED 07/13/2015 1416   COCAINSCRNUR NONE  DETECTED 08-18-2015 1416   LABBENZ NONE DETECTED August 18, 2015 1416   AMPHETMU NONE DETECTED 2015/08/18 1416   THCU NONE DETECTED 18-Aug-2015 1416   LABBARB NONE DETECTED 08/18/2015 1416      IMAGING I have personally reviewed the radiological images below and agree with the radiology interpretations.  Ct Head Wo Contrast August 18, 2015   1. Positive for hyperdense left MCA M1/bifurcation, and changes of cytotoxic edema in the left MCA territory. ASPECTS score = 6. 2. No associated hemorrhage or mass effect. 3. Chronic left PCA and bilateral thalamostriate artery territory ischemia.  07/26/2015   1. Mild progression of intra-axial pericallosal hemorrhage since 2357 hours yesterday. Intra-axial blood volume there estimated at 42 mL (versus 30 mL previously). Anterior mass effect on the lateral  ventricles and septum pellucidum. 2. Bilateral subarachnoid hemorrhage extension. No intraventricular extension. No ventriculomegaly. 3. Probable small volume of intra-axial hemorrhage also in the encephalomalacia left occipital lobe. 4. Left MCA territory infarct appears fairly limited to the left basal ganglia, insula, and anterior operculum. 5. Underlying chronic small vessel disease.  07/26/2015    1. There is a large right pericallosal hemorrhage, likely due to a hemorrhagic infarct. There is a large amount of subarachnoid hemorrhage, mostly at the midline along both sides of the falx. There is new partial effacement of the quadrigeminal plate cistern. No hydrocephalus. 2. Developing infarct along the anterior horn of the left temporal lobe. The remainder of the left MCA territory cortex appears to be intact. 3. Increased density remains in the left MCA versus the right.   08-18-15 Early CT findings of a large diffuse left cerebral hemisphere MCA infarct involving the frontal, temporal and parietal lobes with sulcal effacement and loss of gray-white differentiation. Persistent hyper attenuation of the left MCA vasculature. No acute intracranial hemorrhage.   Cerebral Angiogram  08/18/2015 1.Near complete occlusion of LT ICA terminus,and Lt ACA prox ,and complete occlusion of LT MCA with complete revascularization of LT MCA,LT ACA and LT ICA terminus achieving a TICI 3 reperfusion with x 2 passes with Solitaire 4 mm x 40 mm SR retrieval device   Dg Chest Port 1 View 08-18-15  Interval repositioning of the endotracheal tube which is now in satisfactory radiographic position. Mild pulmonary vascular congestion.   08/18/2015   Interval intubation with endotracheal tube within the proximal right mainstem bronchus. Retraction by 4.5 cm is recommended. Left lower lobe airspace consolidation versus atelectasis with small left pleural effusion.    PHYSICAL EXAM  Temp:  [98.2 F (36.8 C)-102.3 F  (39.1 C)] 102.2 F (39 C) (07/21 0800) Pulse Rate:  [39-130] 81 (07/21 1100) Resp:  [4-27] 22 (07/21 1100) BP: (91-184)/(47-115) 120/56 mmHg (07/21 1100) SpO2:  [94 %-100 %] 100 % (07/21 1109) Arterial Line BP: (95-213)/(57-124) 144/64 mmHg (07/21 1100) FiO2 (%):  [40 %-100 %] 40 % (07/21 1109) Weight:  [118 lb 9.7 oz (53.8 kg)] 118 lb 9.7 oz (53.8 kg) (07/20 1355)  General - Well nourished, well developed, intubated and on no sedation.  Ophthalmologic - Fundi not visualized due to not noncooperation.  Cardiovascular - Regular rate and rhythm.  Neuro - intubated and on no sedation, not open eyes on voice or pain, not following commands. PERRL, positive corneal and gag and cough. Doll's eye present, unsustained nystagmus on the right, not on the left. Facial symmetry difficult to check due to ET tube. On pain stimulation, LUE 1/5, RUE and RLE and LLE withdraw to pain but not against gravity. Right babinski positive, left  negative. DTR 1+. Sensation, coordination and gait not able to test.    ASSESSMENT/PLAN Ms. [name] is a 70 y.o. 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 She received IV t-PA 08/08/15 at 1400. She was then taken to IR where she received TICI3 reperfusion of the near occlusion of L ICA terminus and L ACA and complete occlusion of L MCA using 2 passes of the Solitaire device. Overnight developed vomiting and seizure, head CT showed frontal ICH with SAH and SDH.  Stroke:  Dominant left ICA terminus occlusion s/p IV tPA and TICI3 revascularization of L ICA/MCA/ACA, infarct embolic secondary to unclear source.  Initial CT showed no ICH, but left MCA hyperdense sign, left MCA effacement. ASPECT = 6  MRI  ordered  MRA  Ordered  Cerebral angio - left ICA terminus and ASA near occlusion, and MCA occlusion - s/p TICI3 recannulization  2D Echo  pending   LDL  80  HgbA1c pending  SCDs ordered for VTE prophylaxis  Diet NPO time specified  aspirin 81 mg daily and clopidogrel 75 mg daily prior to admission, now on No antithrombotic nas within 24h of tPA administration and will not start after 24h given ICH  Ongoing aggressive stroke risk factor management  Therapy recommendations:  pending   Disposition:  pending   Hemorrhagic transformation  CT head immediate post op no ICH  Repeat CT head after vomiting and seizure showed frontal ICH with SAH and SDH  Reported BP was high on albumin and neo at arrival of neuro ICH  Repeat CT 07/26/15 6am showed stable ICH with SAH SDH without hydrocephalus  Repeat CT in am  Received FFP x 2  Seizure   GTC x 2 due to ICH  Ativan  x 1  keppra  bid  EEG pending  Acute Respiratory Failure  Secondary to stroke  Intubated for procedure  Remains on vent and unable to protect airway  Hypertensive Emergency  BP up in the 220s in A-line with good waveform post IR upon arrival to the ICU  Started on carded at 17:43.   Remained on cardene until about 7a this am  BP currently Stable, 122/57  BP goal < 140 due to ICH  Hyperlipidemia  Home meds:  lipitor 20 mg daily  LDL 80, goal < 70  Resume statin once feeding tube in place  Continue statin at discharge  Diabetes  HgbA1c pending, goal < 7.0  Controlled  Other Stroke Risk Factors  Advanced age  Hx stroke/TIA  07/2001 - bilateral thalamic and midbrain infarct, no source found. TEE done at that time. 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   Acute blood Loss Anemia  Hgb 9.5->10.9->7.2  Likely secondary to IR and hydratioin  CCM on board  Close monitoring  Febrile, temperature elevation  TM 102.3 during the night  Treated with tylenol  WBC 6.9->10.2  Close monitoring  Other Active Problems  AKI Cr 3->2.67  Hospital day # 1  This patient is critically ill due to left ICA occlusion, left MCA infarct  s/p tPA and IR, hemorrhagic transformation s/p tPA, GTC seizure, respiratory failure, anemia and at significant risk of neurological worsening, death form recurrent stroke, cerebral edema, brain herniation, status epilepticus, heart failure. This patient's care requires constant monitoring of vital signs, hemodynamics, respiratory and cardiac monitoring, review of multiple databases, neurological assessment, discussion with family, other specialists and medical decision making of  high complexity. I spent 45 minutes of neurocritical care time in the care of this patient.  I had long discussion with son and husband at bedside. Updated pt condition, further testing, treatment plan and prognosis. At this time, pt family would like full code and do everything needed for the pt. They are OK with trach PEG nursing home if those are needed.  Marvel PlanJindong Rolando Hessling, MD PhD Stroke Neurology 07/26/2015 12:55 PM   To contact Stroke Continuity provider, please refer to [email or web address]. After hours, contact General Neurology

## 2015-07-26 NOTE — Progress Notes (Signed)
PT Cancellation Note  Patient Details Name: [name] MRN: [id number] DOB: 1945/04/11   Cancelled Treatment:    Reason Eval/Treat Not Completed: Medical issues which prohibited therapy;Patient not medically ready.    Fabio AsaWerner, Akeel Reffner J 07/26/2015, 8:06 AM Charlotte Crumbevon Ronan Duecker, PT DPT  [phone]

## 2015-07-26 NOTE — Progress Notes (Signed)
Bedside EEG completed, results pending. 

## 2015-07-26 NOTE — Progress Notes (Signed)
RT NOTE:  Pt transported to CT and back to 3MW without event.  

## 2015-07-26 NOTE — Progress Notes (Signed)
OT Cancellation Note  Patient Details Name: [name] MRN: [id number] DOB: 1945/10/28   Cancelled Treatment:    Reason Eval/Treat Not Completed: Patient not medically ready (active bedrest orders).  Gaye AlkenBailey A Roanne Haye M.S., OTR/L Pager: [phone]  07/26/2015, 7:24 AM

## 2015-07-26 NOTE — Anesthesia Postprocedure Evaluation (Signed)
Anesthesia Post Note  Patient: [name]  Procedure(s) Performed: Procedure(s) (LRB): RADIOLOGY WITH ANESTHESIA (N/A)  Patient location during evaluation: ICU Anesthesia Type: General Level of consciousness: patient remains intubated per anesthesia plan Pain management: pain level controlled Vital Signs Assessment: post-procedure vital signs reviewed and stable Respiratory status: patient remains intubated per anesthesia plan Cardiovascular status: stable Anesthetic complications: no    Last Vitals:  Filed Vitals:   07/26/15 0630 07/26/15 0700  BP: 114/59 118/57  Pulse: 94 98  Temp:    Resp: 15 11    Last Pain: There were no vitals filed for this visit.               EDWARDS,Tamecia Mcdougald

## 2015-07-26 NOTE — Care Management Important Message (Signed)
Important Message  Patient Details  Name: [name] MRN: [id number] Date of Birth: July 19, 1945   Medicare Important Message Given:  Yes    Bernadette HoitShoffner, Jaja Switalski Coleman 07/26/2015, 10:20 AM

## 2015-07-26 NOTE — Progress Notes (Signed)
SLP Cancellation Note  Patient Details Name: [name] MRN: [id number] DOB: [birth year]   Cancelled treatment:        Pt on ventilator. Will follow along for status.   Royce MacadamiaLitaker, Madilynn Montante Willis 07/26/2015, 8:08 AM  Breck CoonsLisa Willis Lonell FaceLitaker M.Ed [employer]SLP Pager [phone]

## 2015-07-26 NOTE — Progress Notes (Signed)
Initial Nutrition Assessment  INTERVENTION:   If enteral nutrition therapy warranted recommend:  Vital AF 1.2 @ 50 ml/hr Provides: 1200 ml, 1440 kcal, 90 grams protein, and 973 ml H2O.   NUTRITION DIAGNOSIS:   Inadequate oral intake related to inability to eat as evidenced by NPO status.  GOAL:   Patient will meet greater than or equal to 90% of their needs  MONITOR:   I & O's, Vent status  REASON FOR ASSESSMENT:   Ventilator    ASSESSMENT:   [name] is a 70 yo female who was admitted for L MCA stroke, s/p tPA and revascularization, remains on ventilator after revascularization.  Patient is currently intubated on ventilator support MV: 8.6 L/min Temp (24hrs), Avg:99.7 F (37.6 C), Min:98.2 F (36.8 C), Max:102.3 F (39.1 C)  Medications reviewed Labs reviewed: CBG 125 Nutrition-Focused physical exam completed. Findings are no fat depletion, no muscle depletion, and no edema.   Pt discussed during ICU rounds and with RN.  Pt vomited x 2  Diet Order:  Diet NPO time specified  Skin:  Reviewed, no issues  Last BM:  unknown  Height:   Ht Readings from Last 1 Encounters:  07/26/2015 5\' 6"  (1.676 m)    Weight:   Wt Readings from Last 1 Encounters:  07/24/2015 118 lb 9.7 oz (53.8 kg)    Ideal Body Weight:  59 kg  BMI:  Body mass index is 19.15 kg/(m^2).  Estimated Nutritional Needs:   Kcal:  1365  Protein:  80-100 grams  Fluid:  > 1.5 L/day  EDUCATION NEEDS:   No education needs identified at this time   Kendell BaneHeather Rashida Ladouceur RD, LDN, CNSC [phone] Pager [phone] After Hours Pager

## 2015-07-26 NOTE — Progress Notes (Signed)
Transported pt on vent to MRI 

## 2015-07-26 NOTE — Progress Notes (Signed)
Neurology Progress Note  Subjective: Notified by RN that patient had several episodes of emesis. Emergent head CT was obtained. She had a brief GTC seizure after returning to the unit, lasting perhaps 30 seconds.    PE: Gen: She is intubated. She is on a fentanyl infusion, limiting her exam. She does not open her eyes or follow commands.  CN: The left pupil is post-surgical and is large and irregular. The right pupil is round and reactive from 3-->2 mm. She does not blink to visual threat. Her eyes are conjugate. Corneals are present bilaterally. Her face appears symmetric but is partly obscured by tubes and tape.  Motor: She has slightly increased tone in both legs. No spontaneous movement is observed. No abnormal movements are seen.  Sens: She grimaces to nailbed pressure x4. She has flexion of BLE, R>L, to nailbed pressure in the legs. She extends the fingers to nailbed pressure on the left hand. She flexes the RUE to nailbed pressure.  DTRs: 3+ BLE, brisk 2+ BUE. Toes are upgoing bilaterally.   Imaging: I have personally and independently reviewed the [facility] without contrast from this morning. This shows acute intracranial hemorrhage with a large R pericallosal hemorrhage (4 X 2.8 cm) with resultant mass effect on the frontal horns of the lateral ventricles. There is significant subarachnoid hemorrhage largely confined to the parasagittal frontal lobes. Additional hemorrhage is noted in the left occipital region in an area of prior infarction. Vasogenic edema is noted around the hemorrhage. There is no shift or hydrocephalus.   A/P:  1. Intracranial hemorrhage: She has suffered a severe hemorrhage with both intraparenchymal and subarachnoid components following the administration of IV tPA and thrombectomy on 7/20. She did not have any elevation of BP preceding the bleed.       -FFP x 2 units now      -continue to monitor BP closely, keeping SBP less than        160      -repeat CTH without  contrast in six hours  2. Seizure: This is acute in the setting of intracranial hemorrhage.       -Ativan 2 mg IV given after seizure      -Start Keppra 1000 mg IV q12      -seizure precautions  I called the patient's daughter to inform her of the change in the patient's condition and asked her to come to the hospital. Numerous family members arrived, including the patient's husband, son, daughter, and multiple extended family members. They were advised of the patient's hemorrhage and I reviewed the CT scan with them on PACS. They were advised of the need for FFP to attempt to reverse any residual effects of tPA and are in agreement with this. I shared that her condition is critical and that the next 12-24 hours will be crucial in determining how she may do. I discussed potential further complications, including additional bleeding, ongoing seizures, and development of cerebral edema. They are aware that her prognosis is grim. They asked how badly she would be affected if she were to survive. I shared that it is difficult to predict with certainty but that I would anticipate significant cognitive problems and possible weakness in BLE that could limit her ability to ambulate. They were given the opportunity to ask questions and these were addressed to the best of my ability.   I discussed code status with her son and daughter. They would like to have time to discuss this. She remains full code  at this time.   A total of 80 minutes of critical care time was spent, including d/w patient's family.

## 2015-07-26 NOTE — Progress Notes (Signed)
PULMONARY / CRITICAL CARE MEDICINE   Name: [name] MRN: [id number] DOB: [birth year]    ADMISSION DATE:  08-15-2015 CONSULTATION DATE:  2015-08-15  REFERRING MD:  IR  CHIEF COMPLAINT:  stroke  HISTORY OF PRESENT ILLNESS:   [name] is a 70 yo female who was found to be less responsive and weak on her right side by family today 2 hours prior to presentation to the ED. Patient was brought to the ED and was noted to have slurred speech and right sided hemineglect and weakness. A code stroke was called. CT performed and was concerning for left MCA ischemic stroke. TPA was subsequently given. Neurology decided to proceed with endovascular intervention with interventional radiology to retrieve the clot. Interventional radiology performed revascularization and reperfusion was achieved. She was placed on the vent during the procedure. Noted to have a large ICH post op.   PCCM was consulted for management of mechanical ventilation and medical issues.   PAST MEDICAL HISTORY :  She  has a past medical history of Myocardial infarction (HCC) (04/2012); DVT (deep venous thrombosis) (HCC); Pneumonia; Exertional asthma; Type II diabetes mellitus (HCC); GERD (gastroesophageal reflux disease); Headache(784.0); Arthritis; Stroke (HCC) (2005); Chronic diastolic CHF (congestive heart failure) (HCC); and Coronary artery disease.  PAST SURGICAL HISTORY: She  has past surgical history that includes Tubal ligation; Cataract extraction, extracapsular (Left); Dilation and curettage of uterus; Coronary angioplasty with stent (04/2012); Coronary angioplasty (04/2012); left heart catheterization with coronary angiogram (N/A, 04/05/2012); and percutaneous coronary stent intervention (pci-s) (04/05/2012).  Allergies  Allergen Reactions  . Penicillins Nausea And Vomiting    No current facility-administered medications on file prior to encounter.   Current Outpatient Prescriptions on File Prior to Encounter  Medication Sig  .  aspirin 81 MG EC tablet Take 81 mg by mouth daily.  Marland Kitchen atorvastatin (LIPITOR) 20 MG tablet Take 20 mg by mouth daily.   . carvedilol (COREG) 3.125 MG tablet Take 3.125 mg by mouth 2 (two) times daily with a meal.  . clopidogrel (PLAVIX) 75 MG tablet Take 75 mg by mouth daily.  Marland Kitchen FLUoxetine (PROZAC) 10 MG capsule Take 10 mg by mouth daily.  . pantoprazole (PROTONIX) 40 MG tablet Take 40 mg by mouth daily.  . calcium-vitamin D (OSCAL WITH D) 500-200 MG-UNIT per tablet Take 1 tablet by mouth daily with breakfast.  . ferrous sulfate 325 (65 FE) MG tablet Take 325 mg by mouth 3 (three) times daily with meals.  . nitroGLYCERIN (NITROSTAT) 0.4 MG SL tablet Place 0.4 mg under the tongue every 5 (five) minutes x 3 doses as needed for chest pain.    FAMILY HISTORY:  Her indicated that her mother is deceased. She indicated that her father is deceased.   SOCIAL HISTORY: She  reports that she has quit smoking. She has never used smokeless tobacco. She reports that she does not drink alcohol or use illicit drugs.  REVIEW OF SYSTEMS:   Unable to obtain due to ventilation  SUBJECTIVE:  See HPI  VITAL SIGNS: BP 118/57 mmHg  Pulse 98  Temp(Src) 102.2 F (39 C) (Oral)  Resp 11  Ht  (1.676 m)  Wt 118 lb 9.7 oz (53.8 kg)  BMI 19.15 kg/m2  SpO2 100%  HEMODYNAMICS:    VENTILATOR SETTINGS: Vent Mode:  [-] CPAP;PSV FiO2 (%):  [40 %-100 %] 40 % Set Rate:  [12 bmp-18 bmp] 18 bmp Vt Set:  [500 mL] 500 mL PEEP:  [5 cmH20] 5 cmH20 Pressure Support:  [8  cmH20] 8 cmH20 Plateau Pressure:  [10 cmH20-20 cmH20] 16 cmH20  INTAKE / OUTPUT: I/O last 3 completed shifts: In: 3483.5 [I.V.:2623.5; Blood:500; Other:200; IV Piggyback:160] Out: 1680 [Urine:1650; Blood:30]  PHYSICAL EXAMINATION: General:  Thin female. No distress Neuro:  Does not follow commands HEENT:  Moist mucus membranes, No thyromegaly or JVD Cardiovascular:  RRR, No MRG Lungs: Clear, no wheeze or crackles Abdomen:  Soft, NT,  ND Musculoskeletal:  Decreased muscle bulk, no edema, feet are cold Skin: No rashes  LABS:  BMET  Recent Labs Lab 2015/07/28 1346 2015/07/28 1356 07/26/15 0320  NA 139 141 138  K 4.8 4.8 5.0  CL 113* 109 111  CO2 21*  --  21*  BUN 36* 36* 32*  CREATININE 2.97* 3.00* 2.67*  GLUCOSE 123* 118* 134*    Electrolytes  Recent Labs Lab July 28, 2015 1346 07/26/15 0320  CALCIUM 8.6* 8.7*    CBC  Recent Labs Lab 07/28/15 1346 07-28-15 1356 07/26/15 0320  WBC 6.9  --  10.2  HGB 9.5* 10.9* 7.2*  HCT 29.5* 32.0* 22.3*  PLT 281  --  212    Coag's  Recent Labs Lab 2015/07/28 1346  APTT 21*  INR 1.11    Sepsis Markers No results for input(s): LATICACIDVEN, PROCALCITON, O2SATVEN in the last 168 hours.  ABG  Recent Labs Lab Jul 28, 2015 1912 07/26/15 0410  PHART 7.271* 7.440  PCO2ART 48.9* 29.6*  PO2ART 452.0* 171*    Liver Enzymes  Recent Labs Lab 07/28/2015 1346  AST 18  ALT 11*  ALKPHOS 89  BILITOT 0.3  ALBUMIN 3.5    Cardiac Enzymes No results for input(s): TROPONINI, PROBNP in the last 168 hours.  Glucose  Recent Labs Lab 07/26/15 0816  GLUCAP 125*    STUDIES:  7/20 CXR: Interval repositioning of the endotracheal tube which is now in satisfactory radiographic position. Mild pulmonary vascular congestion. 7/20 CT Head:Early CT findings of a large diffuse left cerebral hemisphere MCA infarct involving the frontal, temporal and parietal lobes with sulcal effacement and loss of gray-white differentiation. Persistent hyper attenuation of the left MCA vasculature.No acute intracranial hemorrhage.  7/21 CT head: Large ICH with SAH extension  CULTURES:  ANTIBIOTICS: None  SIGNIFICANT EVENTS: 7/20: Admit to ICU  LINES/TUBES: ET tube 7/20 > Rt fem A line 7/20>  DISCUSSION: [name] is a 70 yo female who was admitted for L MCA stroke, s/p tPA and revascularization Developed large ICH with seizures post procedure.   ASSESSMENT /  PLAN:  PULMONARY A: Inability to protect airway in post operative setting P:   Full vent support Hyperventilation to reduce ICP. Follow ABG  NEUROLOGIC A:   CVA on left MCA stroke causing aphasia and right hemianopia- s/p tPA and clot retrieval  Hx of Stroke w/ R sided deficits 2005 Large ICH Seizures P:   Sedation while on vent RASS goal: -2 to -3 Neurology following Keppra for seizures.  CARDIOVASCULAR A:  Hx of Chronic Diastolic CHF Hx of MI in 2014 Hx of CAD P:  Stable  RENAL A:   CKD (baseline Cr ~3) P:   Monitor urine output and Cr. Replete electrolytes as needed   GASTROINTESTINAL A:   Malnourished Hx of GERD P:   NPO IV Protonix NG tube placement and start feeds  HEMATOLOGIC A:   Mild Anemia Hx of DVT  P:  SCDs  INFECTIOUS A:   No acute concerns P:   Monitor for fever  ENDOCRINE A:   T2DM (not on any diabetic  medications). Last A1C 6 P:   Sensitive SSI  FAMILY  - Updates: Family updated. Goals of care discussion held today AM by Neurology service. The family wants everything done including trach, PEG if needed.  - Inter-disciplinary family meet or Palliative Care meeting due by:  7/27  Critical care time- 35 mins.  Chilton GreathousePraveen Sparrow Sanzo MD [facility] Pager [phone] If no answer or after 3pm call: [phone] 07/26/2015, 10:16 AM

## 2015-07-26 NOTE — Procedures (Signed)
History: 70 year old female with a history of recent stroke  Technique: This is a 21 channel routine scalp EEG performed at the bedside with bipolar and monopolar montages arranged in accordance to the international 10/20 system of electrode placement. One channel was dedicated to EKG recording.    Background: The background consists of generalized irregular delta and theta activities predominantly, there is a posterior dominant rhythm seen with a frequency of 8 Hz that is better seen on the right than left. There is attenuation of all faster frequencies including the posterior dominant rhythm on the left side. No epileptiform activity was seen  Photic stimulation: Physiologic driving is now performed  EEG Abnormalities: 1) attenuation of faster frequencies on the left 2) generalized irregular slow activity  Clinical Interpretation: This EEG is consistent with a focal left hemispheric cortical dysfunction in the setting of a generalized nonspecific cerebral dysfunction(encephalopathy). The focal dysfunction seen is consistent with her known history of recent stroke.  Ritta SlotMcNeill Terren Jandreau, MD [facility] [phone]  If 7pm- 7am, please page neurology on call as listed in AMION.

## 2015-07-26 NOTE — Progress Notes (Signed)
RT NOTE:  RT assisted w/ transport to CT. No complications.

## 2015-07-27 ENCOUNTER — Inpatient Hospital Stay (HOSPITAL_COMMUNITY): Payer: Medicare HMO

## 2015-07-27 DIAGNOSIS — I619 Nontraumatic intracerebral hemorrhage, unspecified: Secondary | ICD-10-CM

## 2015-07-27 DIAGNOSIS — I6789 Other cerebrovascular disease: Secondary | ICD-10-CM

## 2015-07-27 LAB — PREPARE FRESH FROZEN PLASMA
UNIT DIVISION: 0
Unit division: 0

## 2015-07-27 LAB — BLOOD GAS, ARTERIAL
Acid-base deficit: 6 mmol/L — ABNORMAL HIGH (ref 0.0–2.0)
Bicarbonate: 18.2 mEq/L — ABNORMAL LOW (ref 20.0–24.0)
DRAWN BY: 364961
FIO2: 0.4
MECHVT: 500 mL
O2 SAT: 99.2 %
PEEP: 5 cmH2O
PO2 ART: 179 mmHg — AB (ref 80.0–100.0)
Patient temperature: 98.6
RATE: 18 resp/min
TCO2: 19.2 mmol/L (ref 0–100)
pCO2 arterial: 32.3 mmHg — ABNORMAL LOW (ref 35.0–45.0)
pH, Arterial: 7.371 (ref 7.350–7.450)

## 2015-07-27 LAB — GLUCOSE, CAPILLARY
GLUCOSE-CAPILLARY: 107 mg/dL — AB (ref 65–99)
GLUCOSE-CAPILLARY: 95 mg/dL (ref 65–99)
GLUCOSE-CAPILLARY: 104 mg/dL — AB (ref 65–99)
GLUCOSE-CAPILLARY: 102 mg/dL — AB (ref 65–99)
GLUCOSE-CAPILLARY: 76 mg/dL (ref 65–99)
Glucose-Capillary: 94 mg/dL (ref 65–99)

## 2015-07-27 LAB — ECHOCARDIOGRAM COMPLETE
Height: 66 in
WEIGHTICAEL: 1897.72 [oz_av]

## 2015-07-27 LAB — HEMOGLOBIN A1C
Hgb A1c MFr Bld: 5.9 % — ABNORMAL HIGH (ref 4.8–5.6)
Mean Plasma Glucose: 123 mg/dL

## 2015-07-27 MED ORDER — JEVITY 1.5 CAL/FIBER PO LIQD
1000.0000 mL | ORAL | Status: DC
  Administered 2015-07-27 – 2015-07-28 (×2): 1000 mL
  Filled 2015-07-27 (×4): qty 1000

## 2015-07-27 NOTE — Progress Notes (Signed)
eLink Physician-Brief Progress Note Patient Name: [name] DOB: 1945/12/23 MRN: [id number]   Date of Service  07/27/2015  HPI/Events of Note  Round physician wrote in his note today to start tube feeds. No tube feeds ordered.  eICU Interventions  Will order Jevity 1.5 with fiber at 20 mL/hour.     Intervention Category Intermediate Interventions: Arrhythmia - evaluation and management;Other:  Lenell Antu 07/27/2015, 4:11 PM

## 2015-07-27 NOTE — Progress Notes (Signed)
STROKE TEAM PROGRESS NOTE   HISTORY OF PRESENT ILLNESS (per record) [name] is an 70 y.o. 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 Today 2015/08/06 she was seen normal at 1200. Her daughter went out to get her a sausage biscuit and when she returned she tried to sit her mother up. When she did this her mother slumped to the right and could not keep upright. She was not talking clearly. This was very different from her baseline and EMS was called. On arrival CT showed a clear left MCA sign and tPA was given. She did not receive a CTA due to elevated creatinine and was taken directly to IR where she received TICI3 reperfusion of the near occlusion of L ICA terminus and L ACA and complete occlusion of L MCA using 2 passes of the Solitaire device.   No change in her neurological exam Modified Rankin: Rankin Score=4.   Later, pt develop vomiting and seizure, repeat CT showed frontal ICH with SDH and SAH. Kept on keppra and BP control.    OBJECTIVE Temp:  [99.7 F (37.6 C)-101 F (38.3 C)] 99.7 F (37.6 C) (07/21 1800) Pulse Rate:  [49-88] 49 (07/22 0700) Cardiac Rhythm:  [-] Normal sinus rhythm (07/21 2000) Resp:  [13-23] 16 (07/22 0700) BP: (114-149)/(50-106) 134/52 mmHg (07/22 0700) SpO2:  [100 %] 100 % (07/22 0700) Arterial Line BP: (140-154)/(64-68) 140/68 mmHg (07/21 1200) FiO2 (%):  [40 %] 40 % (07/22 1610)  On exam Gen: Intubated, no sedation. She is unresponsive to verbal, tactile, and noxious stimulation. No spontaneous eye opening. She does not follow commands.  CN: Eyes conjugate. She does not blink to threat. R pupil 2 mm and sluggish. L pupil post-surgical, irregular and fixed. She has intact corneals, better on the left. She grimaces to supraorbital pressure bilaterally and to suction. Motor: BUE and LLE are rigid. Tone in the RLE is normal to slightly  increased. No spontaneous movement is observed.  Sens: She has some shoulder adduction to nailbed pressure in the UEs, R>L. Weak flexion is noted to nailbed pressure in the LEs, better on the R.   CBC:  Recent Labs Lab 06-Aug-2015 1346  07/26/15 0320 07/26/15 1649  WBC 6.9  --  10.2 13.9*  NEUTROABS 3.2  --  8.5*  --   HGB 9.5*  < > 7.2* 7.5*  HCT 29.5*  < > 22.3* 23.1*  MCV 87.8  --  86.4 85.9  PLT 281  --  212 197  < > = values in this interval not displayed.  Basic Metabolic Panel:   Recent Labs Lab Aug 06, 2015 1346 2015-08-06 1356 07/26/15 0320  NA 139 141 138  K 4.8 4.8 5.0  CL 113* 109 111  CO2 21*  --  21*  GLUCOSE 123* 118* 134*  BUN 36* 36* 32*  CREATININE 2.97* 3.00* 2.67*  CALCIUM 8.6*  --  8.7*    Lipid Panel:     Component Value Date/Time   CHOL 135 07/26/2015 0320   TRIG 56 07/26/2015 0320   HDL 44 07/26/2015 0320   CHOLHDL 3.1 07/26/2015 0320   VLDL 11 07/26/2015 0320   LDLCALC 80 07/26/2015 0320   HgbA1c:  Lab Results  Component Value Date   HGBA1C 5.9* 07/26/2015   Urine Drug Screen:     Component Value Date/Time   LABOPIA NONE DETECTED August 06, 2015 1416  COCAINSCRNUR NONE DETECTED 03-Aug-2015 1416   LABBENZ NONE DETECTED 08-03-15 1416   AMPHETMU NONE DETECTED Aug 03, 2015 1416   THCU NONE DETECTED 2015/08/03 1416   LABBARB NONE DETECTED 2015-08-03 1416      IMAGING I have personally reviewed the radiological images below and agree with the radiology interpretations.  Ct Head Wo Contrast Aug 03, 2015   1. Positive for hyperdense left MCA M1/bifurcation, and changes of cytotoxic edema in the left MCA territory. ASPECTS score = 6. 2. No associated hemorrhage or mass effect. 3. Chronic left PCA and bilateral thalamostriate artery territory ischemia.  07/26/2015   1. Mild progression of intra-axial pericallosal hemorrhage since 2357 hours yesterday. Intra-axial blood volume there estimated at 42 mL (versus 30 mL previously). Anterior mass effect on  the lateral ventricles and septum pellucidum. 2. Bilateral subarachnoid hemorrhage extension. No intraventricular extension. No ventriculomegaly. 3. Probable small volume of intra-axial hemorrhage also in the encephalomalacia left occipital lobe. 4. Left MCA territory infarct appears fairly limited to the left basal ganglia, insula, and anterior operculum. 5. Underlying chronic small vessel disease.  07/26/2015    1. There is a large right pericallosal hemorrhage, likely due to a hemorrhagic infarct. There is a large amount of subarachnoid hemorrhage, mostly at the midline along both sides of the falx. There is new partial effacement of the quadrigeminal plate cistern. No hydrocephalus. 2. Developing infarct along the anterior horn of the left temporal lobe. The remainder of the left MCA territory cortex appears to be intact. 3. Increased density remains in the left MCA versus the right.   2015/08/03 Early CT findings of a large diffuse left cerebral hemisphere MCA infarct involving the frontal, temporal and parietal lobes with sulcal effacement and loss of gray-white differentiation. Persistent hyper attenuation of the left MCA vasculature. No acute intracranial hemorrhage.   Cerebral Angiogram  Aug 03, 2015 1.Near complete occlusion of LT ICA terminus,and Lt ACA prox ,and complete occlusion of LT MCA with complete revascularization of LT MCA,LT ACA and LT ICA terminus achieving a TICI 3 reperfusion with x 2 passes with Solitaire 4 mm x 40 mm SR retrieval device   Dg Chest Port 1 View 08/03/15  Interval repositioning of the endotracheal tube which is now in satisfactory radiographic position. Mild pulmonary vascular congestion.   08-03-2015   Interval intubation with endotracheal tube within the proximal right mainstem bronchus. Retraction by 4.5 cm is recommended. Left lower lobe airspace consolidation versus atelectasis with small left pleural effusion.    MR MRA head without contrast    07/26/2015 1. Acute left MCA infarct centered at the left insula, operculum, and basal ganglia. Cytotoxic edema with petechial hemorrhage and mild regional mass effect. 2. Anterior pericallosal hemorrhage re - demonstrated with estimated blood volume 39 mm (stable from this morning). Stable regional mass effect. 3. Subarachnoid and small volume intraventricular hemorrhage extension. No ventriculomegaly. 4. Negative intracranial MRA aside from leftward mass effect on the ACA A2 segments. No left MCA branch occlusion identified. Chronic left PCA, bithalamic and left midbrain ischemia.  CT head without contrast 07/27/2015 1. Similar appearance of large RIGHT frontal intraparenchymal hematoma with similar mass effect. 2. Evolving large LEFT MCA territory infarct without hemorrhagic conversion. 3. Stable LEFT occipital lobe encephalomalacia with hemorrhagic conversion. 4. Similar moderate subarachnoid hemorrhage.  EEG 07/26/2015 EEG Abnormalities:  1) attenuation of faster frequencies on the left 2) generalized irregular slow activity Clinical Interpretation: This EEG is consistent with a focal left hemispheric cortical dysfunction in the setting of a generalized nonspecific cerebral  dysfunction(encephalopathy). The focal dysfunction seen is consistent with her known history of recent stroke.        PHYSICAL EXAM  Temp:  [99.7 F (37.6 C)-101 F (38.3 C)] 99.7 F (37.6 C) (07/21 1800) Pulse Rate:  [49-88] 49 (07/22 0700) Resp:  [13-23] 16 (07/22 0700) BP: (114-149)/(50-106) 134/52 mmHg (07/22 0700) SpO2:  [100 %] 100 % (07/22 0700) Arterial Line BP: (140-154)/(64-68) 140/68 mmHg (07/21 1200) FiO2 (%):  [40 %] 40 % (07/22 1610)  General - Well nourished, well developed, intubated and on no sedation.  Ophthalmologic - Fundi not visualized due to not noncooperation.  Cardiovascular - Regular rate and rhythm.  Neuro - intubated and on no sedation, not open eyes on voice or pain,  not following commands. PERRL, positive corneal and gag and cough. Doll's eye present, unsustained nystagmus on the right, not on the left. Facial symmetry difficult to check due to ET tube. On pain stimulation, LUE 1/5, RUE and RLE and LLE withdraw to pain but not against gravity. Right babinski positive, left negative. DTR 1+. Sensation, coordination and gait not able to test.    ASSESSMENT/PLAN Ms. Raphaela Cannaday is a 70 y.o. 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 She received IV t-PA 08/19/2015 at 1400. She was then taken to IR where she received TICI3 reperfusion of the near occlusion of L ICA terminus and L ACA and complete occlusion of L MCA using 2 passes of the Solitaire device. Hospital course-she developed vomiting and seizure, head CT showed frontal ICH with SAH and SDH.   Stroke:  Dominant left ICA terminus occlusion s/p IV tPA and TICI3 revascularization of L ICA/MCA/ACA, infarct embolic secondary to unclear source.  Initial CT showed no ICH, but left MCA hyperdense sign, left MCA effacement. ASPECT = 6  MRI  please see above  MRA  Negative intracranial MRA aside from leftward mass effect on the ACA A2 segments.   Cerebral angio - left ICA terminus and ASA near occlusion, and MCA occlusion - s/p TICI3 recannulization  2D Echo  pending   LDL 80  HgbA1c 5.9  SCDs ordered for VTE prophylaxis Diet NPO time specified  aspirin 81 mg daily and clopidogrel 75 mg daily prior to admission, now on No antithrombotic nas within 24h of tPA administration and will not start after 24h given ICH  Ongoing aggressive stroke risk factor management  Therapy recommendations:  pending   Disposition:  pending   Hemorrhagic transformation  CT head immediate post op no ICH  Repeat CT head after vomiting and seizure showed frontal ICH with SAH and SDH  Reported BP was high on  albumin and neo at arrival of neuro ICH  Repeat CT 07/26/15 6am showed stable ICH with SAH SDH without hydrocephalus  Repeat CT 07/27/2015 - please see above  Received FFP x 2  Seizure   GTC x 2 due to ICH  Ativan 2mg  x 1  keppra 1000mg  bid  EEG - as above - consistent with her known history of recent stroke.  Acute Respiratory Failure  Secondary to stroke  Intubated for procedure  Remains on vent and unable to protect airway  Hypertensive Emergency  BP up in the 220s in A-line with good waveform post IR upon arrival to the ICU  Started on cardene at 17:43.   Remained on cardene until about 7a this am  BP currently Stable, 122/57  BP goal < 140 due to ICH - BP 148/69 on Saturday 07/27/2015  Hyperlipidemia  Home meds:  lipitor 20 mg daily  LDL 80, goal < 70  Resume statin once feeding tube in place  Continue statin at discharge  Diabetes  HgbA1c 5.9 , goal < 7.0  Controlled  Other Stroke Risk Factors  Advanced age  Hx stroke/TIA  07/2001 - bilateral thalamic and midbrain infarct, no source found. TEE done at that time. 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   Acute blood Loss Anemia  Hgb 9.5->10.9->7.2  Likely secondary to IR and hydratioin  CCM on board  Close monitoring   Febrile, temperature elevation  TM 102.3 during the night - Temp Sat 07/27/2015 - pending  CXR 08/03/2015 - Mild pulmonary vascular congestion.  Urine - 07/10/2015 - Protein 30 with trace hemoglobin - otherwise unremarkable  Treated with tylenol  WBC 6.9->10.2 -> 13.9   Pt on cooling blanket  Currently on no antibiotics  Close monitoring  Repeat CBC on Sunday   Other Active Problems  AKI Cr 3->2.67 - repeat bmet on Sunday   Hospital day # 2  This patient is critically ill due to left ICA occlusion, left MCA infarct s/p tPA and IR, hemorrhagic transformation s/p tPA, GTC seizure, respiratory failure, anemia and at significant risk of neurological worsening,  death form recurrent stroke, cerebral edema, brain herniation, status epilepticus, heart failure. This patient's care requires constant monitoring of vital signs, hemodynamics, respiratory and cardiac monitoring, review of multiple databases, neurological assessment, discussion with family, other specialists and medical decision making of high complexity. I spent 45 minutes of neurocritical care time in the care of this patient.  I had long discussion with family at bedside. Updated pt condition, further testing, treatment plan and prognosis. At this time, pt family would like full code and do everything needed for the pt. They are OK with trach PEG nursing home if those are needed.  Z. Willodean Rosenthal. MDNeurology 07/27/2015 8:07 AM   To contact Stroke Continuity provider, please refer to [email or web address]. After hours, contact General Neurology

## 2015-07-27 NOTE — Progress Notes (Addendum)
Neurology Progress Note  Called to see patient regarding change in exam. RN reports that she is no longer opening her eyes to pain and she has developed rigidity in her upper extremities. She has not had any seizure activity. Her vitals have been stable.   PE: Gen: Intubated, no sedation. She is unresponsive to verbal, tactile, and noxious stimulation. No spontaneous eye opening. She does not follow commands.  CN: Eyes conjugate. She does not blink to threat. R pupil 2 mm and sluggish. L pupil post-surgical, irregular and fixed. She has intact corneals, better on the left. She grimaces to supraorbital pressure bilaterally and to suction. Motor: BUE and LLE are rigid. Tone in the RLE is normal to slightly increased. No spontaneous movement is observed.  Sens: She has some shoulder adduction to nailbed pressure in the UEs, R>L. Weak flexion is noted to nailbed pressure in the LEs, better on the R.  DTRs: Limited by spasticity.  A/P:  ICH following IV tPA and clot retrieval 7/20. Now with change in exam with increased tone in UEs, no longer opening eyes to pain.      -will check CT head without contrast now to evaluate ICH        for possible extension and/or herniation      -continue to ensure SBP <140 mmHg      -avoid sedation so that her exam can be followed  Addendum: CTH reviewed. No significant change with stable frontal ICH and subarachnoid blood. Evolving L MCA territory hypodensity c/w known stroke. No shift or herniation. No intraventricular hemorrhage, no hydrocephalus. Continue with current management and follow exam.   A total of 32 minutes critical care time spent.

## 2015-07-27 NOTE — Progress Notes (Signed)
Patient transported to CT Scan and returned to unit without any complications. 

## 2015-07-27 NOTE — Progress Notes (Signed)
Neurology Note  RN has observed two episodes of tachypnea (up to 45 breaths/min) and tachycardia (up to 145 beats/min), each lasting 30-45 seconds. No seizure activity or abnormal motor activity has been observed with these episodes. Will continue to monitor--? nonconvulsive seizure vs sympathetic storming. If this recurs, will give Ativan 2 mg and observe in the event these are seizures.

## 2015-07-27 NOTE — Progress Notes (Signed)
Paged Neurologist regarding patient no longer opening eyes and rigidity in upper extremities.

## 2015-07-27 NOTE — Progress Notes (Signed)
PT Cancellation Note  Patient Details Name: Sakshi Goodbar MRN: [id number] DOB: [birth year]   Cancelled Treatment:    Reason Eval/Treat Not Completed: Medical issues which prohibited therapy;Patient not medically ready Will follow up.   Blake Divine A Trinidad Petron 07/27/2015, 7:58 AM Mylo Red, PT, DPT [phone]

## 2015-07-27 NOTE — Progress Notes (Signed)
PULMONARY / CRITICAL CARE MEDICINE   Name: [name] MRN: [id number] DOB: [birth year]    ADMISSION DATE:  07/17/2015 CONSULTATION DATE:  08/05/2015  REFERRING MD:  IR  CHIEF COMPLAINT:  stroke  HISTORY OF PRESENT ILLNESS:   [name] is a 70 yo female who was found to be less responsive and weak on her right side by family today 2 hours prior to presentation to the ED. Patient was brought to the ED and was noted to have slurred speech and right sided hemineglect and weakness. A code stroke was called. CT performed and was concerning for left MCA ischemic stroke. TPA was subsequently given. Neurology decided to proceed with endovascular intervention with interventional radiology to retrieve the clot. Interventional radiology performed revascularization and reperfusion was achieved. She was placed on the vent during the procedure. Noted to have a large ICH post op.   PCCM was consulted for management of mechanical ventilation and medical issues.   SUBJECTIVE:  Unresponsive on MV Repeat head CT was done this am  VITAL SIGNS: BP 148/69 mmHg  Pulse 76  Temp(Src) 99.7 F (37.6 C) (Core (Comment))  Resp 25  Ht  (1.676 m)  Wt 53.8 kg (118 lb 9.7 oz)  BMI 19.15 kg/m2  SpO2 100%  HEMODYNAMICS:    VENTILATOR SETTINGS: Vent Mode:  [-] CPAP;PSV FiO2 (%):  [40 %] 40 % Set Rate:  [18 bmp] 18 bmp Vt Set:  [500 mL] 500 mL PEEP:  [5 cmH20] 5 cmH20 Pressure Support:  [8 cmH20] 8 cmH20 Plateau Pressure:  [16 cmH20-21 cmH20] 16 cmH20  INTAKE / OUTPUT: I/O last 3 completed shifts: In: 4048.8 [I.V.:2918.8; Blood:500; Other:200; IV Piggyback:430] Out: 2820 [Urine:2820]  PHYSICAL EXAMINATION: General:  Thin female. No distress Neuro:  Comatose, no response to my exam HEENT:  L eye post surgical, R eye 2mm, Moist mucus membranes, No thyromegaly or JVD Cardiovascular:  RRR, No MRG Lungs: Clear, no wheeze or crackles Abdomen:  Soft, NT, ND Musculoskeletal:  Decreased muscle bulk, no  edema, feet are cold Skin: No rashes  LABS:  BMET  Recent Labs Lab 07/14/2015 1346 07/09/2015 1356 07/26/15 0320  NA 139 141 138  K 4.8 4.8 5.0  CL 113* 109 111  CO2 21*  --  21*  BUN 36* 36* 32*  CREATININE 2.97* 3.00* 2.67*  GLUCOSE 123* 118* 134*    Electrolytes  Recent Labs Lab 07/28/2015 1346 07/26/15 0320  CALCIUM 8.6* 8.7*    CBC  Recent Labs Lab 07/16/2015 1346 07/07/2015 1356 07/26/15 0320 07/26/15 1649  WBC 6.9  --  10.2 13.9*  HGB 9.5* 10.9* 7.2* 7.5*  HCT 29.5* 32.0* 22.3* 23.1*  PLT 281  --  212 197    Coag's  Recent Labs Lab 07/29/2015 1346  APTT 21*  INR 1.11    Sepsis Markers No results for input(s): LATICACIDVEN, PROCALCITON, O2SATVEN in the last 168 hours.  ABG  Recent Labs Lab 08/04/2015 1912 07/26/15 0410 07/27/15 0620  PHART 7.271* 7.440 7.371  PCO2ART 48.9* 29.6* 32.3*  PO2ART 452.0* 171* 179*    Liver Enzymes  Recent Labs Lab 07/10/2015 1346  AST 18  ALT 11*  ALKPHOS 89  BILITOT 0.3  ALBUMIN 3.5    Cardiac Enzymes No results for input(s): TROPONINI, PROBNP in the last 168 hours.  Glucose  Recent Labs Lab 07/26/15 1149 07/26/15 1611 07/26/15 2029 07/26/15 2313 07/27/15 0314 07/27/15 0733  GLUCAP 110* 106* 98 99 107* 95    STUDIES:  7/20 CXR:  Interval repositioning of the endotracheal tube which is now in satisfactory radiographic position. Mild pulmonary vascular congestion. 7/20 CT Head:Early CT findings of a large diffuse left cerebral hemisphere MCA infarct involving the frontal, temporal and parietal lobes with sulcal effacement and loss of gray-white differentiation. Persistent hyper attenuation of the left MCA vasculature.No acute intracranial hemorrhage.  7/21 CT head: Large ICH with SAH extension  CULTURES:  ANTIBIOTICS: None  SIGNIFICANT EVENTS: 7/20: Admit to ICU  LINES/TUBES: ET tube 7/20 > Rt fem A line 7/20>  DISCUSSION: [name] is a 70 yo female who was admitted for L MCA stroke,  s/p tPA and revascularization Developed large ICH with seizures post procedure.   ASSESSMENT / PLAN:  PULMONARY A: Inability to protect airway in post operative setting P:   Full vent support, MS precludes extubation Hyperventilation to reduce ICP. Follow ABG  NEUROLOGIC A:   CVA on left MCA stroke causing aphasia and right hemianopia- s/p tPA and clot retrieval  Hx of Stroke w/ R sided deficits 2005 Large ICH Seizures P:   Sedation > decrease as able RASS goal: 0 to -1 Neurology following Keppra for seizures.  CARDIOVASCULAR A:  Hx of Chronic Diastolic CHF Hx of MI in 2014 Hx of CAD P:  Stable  RENAL A:   CKD (baseline Cr ~3) P:   Monitor urine output and Cr. Replete electrolytes as needed   GASTROINTESTINAL A:   Malnourished Hx of GERD P:   NPO IV Protonix NG tube placement and TF  HEMATOLOGIC A:   Mild Anemia Hx of DVT  P:  SCDs  INFECTIOUS A:   No acute concerns P:   Monitor for fever  ENDOCRINE A:   T2DM (not on any diabetic medications). Last A1C 6 P:   Sensitive SSI  FAMILY  - Updates:  Family updated by Dr Delton Coombes 7/22. Informed them of poor prognosis, little chance to recover to previous functional capacity - Inter-disciplinary family meet or Palliative Care meeting due by:  7/27  Critical care time- 33 mins.  Levy Pupa, MD, PhD 07/27/2015, 9:24 AM [facility] [phone] or if no answer [phone]

## 2015-07-27 NOTE — Progress Notes (Signed)
Echocardiogram 2D Echocardiogram has been performed.  [name] 07/27/2015, 11:51 AM

## 2015-07-28 ENCOUNTER — Inpatient Hospital Stay (HOSPITAL_COMMUNITY): Payer: Medicare HMO

## 2015-07-28 LAB — BASIC METABOLIC PANEL
Anion gap: 8 (ref 5–15)
BUN: 37 mg/dL — AB (ref 6–20)
CHLORIDE: 112 mmol/L — AB (ref 101–111)
CO2: 18 mmol/L — ABNORMAL LOW (ref 22–32)
CREATININE: 2.56 mg/dL — AB (ref 0.44–1.00)
Calcium: 8.8 mg/dL — ABNORMAL LOW (ref 8.9–10.3)
GFR calc non Af Amer: 18 mL/min — ABNORMAL LOW (ref >60)
GFR, EST AFRICAN AMERICAN: 21 mL/min — AB (ref >60)
Glucose, Bld: 103 mg/dL — ABNORMAL HIGH (ref 65–99)
Potassium: 5.1 mmol/L (ref 3.5–5.1)
Sodium: 138 mmol/L (ref 135–145)

## 2015-07-28 LAB — GLUCOSE, CAPILLARY
GLUCOSE-CAPILLARY: 93 mg/dL (ref 65–99)
GLUCOSE-CAPILLARY: 117 mg/dL — AB (ref 65–99)
GLUCOSE-CAPILLARY: 128 mg/dL — AB (ref 65–99)
GLUCOSE-CAPILLARY: 117 mg/dL — AB (ref 65–99)
Glucose-Capillary: 107 mg/dL — ABNORMAL HIGH (ref 65–99)

## 2015-07-28 LAB — CBC
HEMATOCRIT: 25.9 % — AB (ref 36.0–46.0)
HEMOGLOBIN: 8 g/dL — AB (ref 12.0–15.0)
MCH: 27.4 pg (ref 26.0–34.0)
MCHC: 30.9 g/dL (ref 30.0–36.0)
MCV: 88.7 fL (ref 78.0–100.0)
Platelets: 210 10*3/uL (ref 150–400)
RBC: 2.92 MIL/uL — ABNORMAL LOW (ref 3.87–5.11)
RDW: 13.6 % (ref 11.5–15.5)
WBC: 15.2 10*3/uL — ABNORMAL HIGH (ref 4.0–10.5)

## 2015-07-28 LAB — CBC WITH DIFFERENTIAL/PLATELET
Basophils Absolute: 0 10*3/uL (ref 0.0–0.1)
Basophils Relative: 0 %
EOS ABS: 0 10*3/uL (ref 0.0–0.7)
EOS PCT: 0 %
HCT: 25.9 % — ABNORMAL LOW (ref 36.0–46.0)
Hemoglobin: 8.1 g/dL — ABNORMAL LOW (ref 12.0–15.0)
LYMPHS ABS: 1.8 10*3/uL (ref 0.7–4.0)
Lymphocytes Relative: 13 %
MCH: 27.6 pg (ref 26.0–34.0)
MCHC: 31.3 g/dL (ref 30.0–36.0)
MCV: 88.1 fL (ref 78.0–100.0)
MONO ABS: 1.4 10*3/uL — AB (ref 0.1–1.0)
MONOS PCT: 10 %
Neutro Abs: 10.7 10*3/uL — ABNORMAL HIGH (ref 1.7–7.7)
Neutrophils Relative %: 77 %
PLATELETS: 221 10*3/uL (ref 150–400)
RBC: 2.94 MIL/uL — ABNORMAL LOW (ref 3.87–5.11)
RDW: 13.5 % (ref 11.5–15.5)
WBC: 13.9 10*3/uL — ABNORMAL HIGH (ref 4.0–10.5)

## 2015-07-28 MED ORDER — FENTANYL CITRATE (PF) 100 MCG/2ML IJ SOLN
25.0000 ug | INTRAMUSCULAR | Status: DC | PRN
  Administered 2015-07-28 – 2015-07-31 (×6): 25 ug via INTRAVENOUS
  Filled 2015-07-28 (×6): qty 2

## 2015-07-28 MED ORDER — FENTANYL CITRATE (PF) 100 MCG/2ML IJ SOLN
INTRAMUSCULAR | Status: AC
  Administered 2015-07-28: 25 ug
  Filled 2015-07-28: qty 2

## 2015-07-28 NOTE — Progress Notes (Signed)
PULMONARY / CRITICAL CARE MEDICINE   Name: [name] MRN: [id number] DOB: 1945-04-19    ADMISSION DATE:  07/18/2015 CONSULTATION DATE:  07/06/2015  REFERRING MD:  IR  CHIEF COMPLAINT:  stroke  HISTORY OF PRESENT ILLNESS:   [name] is a 70 yo female who was found to be less responsive and weak on her right side by family today 2 hours prior to presentation to the ED. Patient was brought to the ED and was noted to have slurred speech and right sided hemineglect and weakness. A code stroke was called. CT performed and was concerning for left MCA ischemic stroke. TPA was subsequently given. Neurology decided to proceed with endovascular intervention with interventional radiology to retrieve the clot. Interventional radiology performed revascularization and reperfusion was achieved. She was placed on the vent during the procedure. Noted to have a large ICH post op.   PCCM was consulted for management of mechanical ventilation and medical issues.   SUBJECTIVE:  Remains unresponsive off sedation Has been stable on MV, currently on PSV  VITAL SIGNS: BP (!) 146/71   Pulse 85   Temp 100.3 F (37.9 C) (Core (Comment)) Comment (Src): cooling blanket probe  Resp 20   Ht 5\' 6"  (1.676 m)   Wt 53.8 kg (118 lb 9.7 oz)   SpO2 100%   BMI 19.14 kg/m   HEMODYNAMICS:    VENTILATOR SETTINGS: Vent Mode: PRVC FiO2 (%):  [30 %-40 %] 40 % Set Rate:  [18 bmp] 18 bmp Vt Set:  [500 mL] 500 mL PEEP:  [5 cmH20] 5 cmH20 Pressure Support:  [8 cmH20] 8 cmH20 Plateau Pressure:  [17 cmH20-18 cmH20] 17 cmH20  INTAKE / OUTPUT: I/O last 3 completed shifts: In: 3360 [I.V.:2700; NG/GT:280; IV Piggyback:380] Out: 2165 [Urine:2165]  PHYSICAL EXAMINATION: General:  Thin female. No distress Neuro:  Comatose, no response to my exam HEENT:  L eye post surgical, R eye 26mm, Moist mucus membranes, No thyromegaly or JVD Cardiovascular:  RRR, No MRG Lungs: Clear, no wheeze or crackles Abdomen:  Soft, NT,  ND Musculoskeletal:  Decreased muscle bulk, no edema, feet are cold Skin: No rashes  LABS:  BMET  Recent Labs Lab 07/14/2015 1346 08/02/2015 1356 07/26/15 0320 07/28/15 0330  NA 139 141 138 138  K 4.8 4.8 5.0 5.1  CL 113* 109 111 112*  CO2 21*  --  21* 18*  BUN 36* 36* 32* 37*  CREATININE 2.97* 3.00* 2.67* 2.56*  GLUCOSE 123* 118* 134* 103*    Electrolytes  Recent Labs Lab 07/24/2015 1346 07/26/15 0320 07/28/15 0330  CALCIUM 8.6* 8.7* 8.8*    CBC  Recent Labs Lab 07/26/15 1649 07/28/15 0330 07/28/15 0936  WBC 13.9* 15.2* 21.0*  HGB 7.5* 8.0* 11.2*  HCT 23.1* 25.9* 34.5*  PLT 197 210 PENDING    Coag's  Recent Labs Lab 08/04/2015 1346  APTT 21*  INR 1.11    Sepsis Markers No results for input(s): LATICACIDVEN, PROCALCITON, O2SATVEN in the last 168 hours.  ABG  Recent Labs Lab 07/14/2015 1912 07/26/15 0410 07/27/15 0620  PHART 7.271* 7.440 7.371  PCO2ART 48.9* 29.6* 32.3*  PO2ART 452.0* 171* 179*    Liver Enzymes  Recent Labs Lab 08/01/2015 1346  AST 18  ALT 11*  ALKPHOS 89  BILITOT 0.3  ALBUMIN 3.5    Cardiac Enzymes No results for input(s): TROPONINI, PROBNP in the last 168 hours.  Glucose  Recent Labs Lab 07/27/15 1125 07/27/15 1541 07/27/15 1938 07/27/15 2335 07/28/15 1855 07/28/15 0825  GLUCAP 104* 102* 76 94 93 117*    STUDIES:  7/20 CXR: Interval repositioning of the endotracheal tube which is now in satisfactory radiographic position. Mild pulmonary vascular congestion. 7/20 CT Head:Early CT findings of a large diffuse left cerebral hemisphere MCA infarct involving the frontal, temporal and parietal lobes with sulcal effacement and loss of gray-white differentiation. Persistent hyper attenuation of the left MCA vasculature.No acute intracranial hemorrhage.  7/21 CT head: Large ICH with SAH extension  CULTURES:  ANTIBIOTICS: None  SIGNIFICANT EVENTS: 7/20: Admit to ICU  LINES/TUBES: ET tube 7/20 > Rt fem A line  7/20>  DISCUSSION: [name] is a 70 yo female who was admitted for L MCA stroke, s/p tPA and revascularization Developed large ICH with seizures post procedure.   ASSESSMENT / PLAN:  PULMONARY A: Inability to protect airway in post operative setting P:   Full vent support, MS precludes extubation  NEUROLOGIC A:   CVA on left MCA stroke causing aphasia and right hemianopia- s/p tPA and clot retrieval  Hx of Stroke w/ R sided deficits 2005 Large ICH Seizures P:   Sedation > none running RASS goal: 0 to -1 Neurology following Keppra for seizures. Devastating injury with little to no chance for recovery. Neurology has discussed with family, they understand and have decide that withdrawal of care is most appropriate course. I agree with this. We will move to withdraw after they have had opportunity to speak w other family and process.   CARDIOVASCULAR A:  Hx of Chronic Diastolic CHF Hx of MI in 2014 Hx of CAD P:  Stable  RENAL A:   CKD (baseline Cr ~3) P:   Monitor urine output and Cr. Replete electrolytes as needed   GASTROINTESTINAL A:   Malnourished Hx of GERD P:   NPO IV Protonix NG tube placement and TF  HEMATOLOGIC A:   Mild Anemia Hx of DVT  P:  SCDs  INFECTIOUS A:   No acute concerns P:   Monitor for fever  ENDOCRINE A:   T2DM (not on any diabetic medications). Last A1C 6 P:   Sensitive SSI  FAMILY  - Updates:  Family updated by Dr Delton Coombes 7/22 and 7/23. They understands poor prognosis and are moving towards withdrawal of care. I have recommended No CPR in the event of an acute decompensation - they agree. I will change code status in the orders.  - Inter-disciplinary family meet or Palliative Care meeting due by:  7/27  Critical care time- 35 mins.  Levy Pupa, MD, PhD 07/28/2015, 10:29 AM [facility] [phone] or if no answer [phone]

## 2015-07-28 NOTE — Progress Notes (Signed)
CRITICAL VALUE ALERT  Critical value received:  Platelets 20  Date of notification:  07/28/15  Time of notification:  1219  Critical value read back:Yes.    Nurse who received alert:  Docia Barrier  MD notified (1st page):  CCM Pager  Time of first page:  1221  MD notified (2nd page):  Time of second page:  Responding MD:  Dr. Vassie Loll  Time MD responded:  1224

## 2015-07-28 NOTE — Progress Notes (Signed)
STROKE TEAM PROGRESS NOTE   HISTORY OF PRESENT ILLNESS (per record) [name] is an 70 y.o. 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 Today 07/19/2015 she was seen normal at 1200. Her daughter went out to get her a sausage biscuit and when she returned she tried to sit her mother up. When she did this her mother slumped to the right and could not keep upright. She was not talking clearly. This was very different from her baseline and EMS was called. On arrival CT showed a clear left MCA sign and tPA was given. She did not receive a CTA due to elevated creatinine and was taken directly to IR where she received TICI3 reperfusion of the near occlusion of L ICA terminus and L ACA and complete occlusion of L MCA using 2 passes of the Solitaire device.   No change in her neurological exam Modified Rankin: Rankin Score=4.   Later, pt develop vomiting and seizure, repeat CT showed frontal ICH with SDH and SAH. Kept on keppra and BP control.    OBJECTIVE Temp:  [99 F (37.2 C)-100.3 F (37.9 C)] 100.3 F (37.9 C) (07/23 0800) Pulse Rate:  [57-110] 85 (07/23 0831) Cardiac Rhythm: Normal sinus rhythm (07/23 0800) Resp:  [18-33] 20 (07/23 0831) BP: (130-171)/(60-86) 146/71 (07/23 0831) SpO2:  [99 %-100 %] 100 % (07/23 0831) FiO2 (%):  [30 %-40 %] 40 % (07/23 0831)  .   CBC:  Recent Labs Lab 07/17/2015 1346  07/26/15 0320 07/26/15 1649 07/28/15 0330  WBC 6.9  --  10.2 13.9* 15.2*  NEUTROABS 3.2  --  8.5*  --   --   HGB 9.5*  < > 7.2* 7.5* 8.0*  HCT 29.5*  < > 22.3* 23.1* 25.9*  MCV 87.8  --  86.4 85.9 88.7  PLT 281  --  212 197 210  < > = values in this interval not displayed.  Basic Metabolic Panel:   Recent Labs Lab 07/26/15 0320 07/28/15 0330  NA 138 138  K 5.0 5.1  CL 111 112*  CO2 21* 18*  GLUCOSE 134* 103*  BUN 32* 37*  CREATININE 2.67* 2.56*  CALCIUM 8.7*  8.8*    Lipid Panel:     Component Value Date/Time   CHOL 135 07/26/2015 0320   TRIG 56 07/26/2015 0320   HDL 44 07/26/2015 0320   CHOLHDL 3.1 07/26/2015 0320   VLDL 11 07/26/2015 0320   LDLCALC 80 07/26/2015 0320   HgbA1c:  Lab Results  Component Value Date   HGBA1C 5.9 (H) 07/26/2015   Urine Drug Screen:     Component Value Date/Time   LABOPIA NONE DETECTED 08/04/2015 1416   COCAINSCRNUR NONE DETECTED 07/06/2015 1416   LABBENZ NONE DETECTED 07/24/2015 1416   AMPHETMU NONE DETECTED 07/23/2015 1416   THCU NONE DETECTED 07/24/2015 1416   LABBARB NONE DETECTED 07/08/2015 1416      IMAGING I have personally reviewed the radiological images below and agree with the radiology interpretations.  Ct Head Wo Contrast 07/19/2015   1. Positive for hyperdense left MCA M1/bifurcation, and changes of cytotoxic edema in the left MCA territory. ASPECTS score = 6. 2. No associated hemorrhage or mass effect. 3. Chronic left PCA and bilateral thalamostriate artery territory ischemia.  07/26/2015   1. Mild progression of intra-axial pericallosal hemorrhage since 2357 hours yesterday. Intra-axial blood volume there estimated at 42 mL (  versus 30 mL previously). Anterior mass effect on the lateral ventricles and septum pellucidum. 2. Bilateral subarachnoid hemorrhage extension. No intraventricular extension. No ventriculomegaly. 3. Probable small volume of intra-axial hemorrhage also in the encephalomalacia left occipital lobe. 4. Left MCA territory infarct appears fairly limited to the left basal ganglia, insula, and anterior operculum. 5. Underlying chronic small vessel disease.  07/26/2015    1. There is a large right pericallosal hemorrhage, likely due to a hemorrhagic infarct. There is a large amount of subarachnoid hemorrhage, mostly at the midline along both sides of the falx. There is new partial effacement of the quadrigeminal plate cistern. No hydrocephalus. 2. Developing infarct along the  anterior horn of the left temporal lobe. The remainder of the left MCA territory cortex appears to be intact. 3. Increased density remains in the left MCA versus the right.   2015-08-16 Early CT findings of a large diffuse left cerebral hemisphere MCA infarct involving the frontal, temporal and parietal lobes with sulcal effacement and loss of gray-white differentiation. Persistent hyper attenuation of the left MCA vasculature. No acute intracranial hemorrhage.   Cerebral Angiogram  08-16-15 1.Near complete occlusion of LT ICA terminus,and Lt ACA prox ,and complete occlusion of LT MCA with complete revascularization of LT MCA,LT ACA and LT ICA terminus achieving a TICI 3 reperfusion with x 2 passes with Solitaire 4 mm x 40 mm SR retrieval device   Dg Chest Port 1 View 08/16/2015  Interval repositioning of the endotracheal tube which is now in satisfactory radiographic position. Mild pulmonary vascular congestion.   August 16, 2015   Interval intubation with endotracheal tube within the proximal right mainstem bronchus. Retraction by 4.5 cm is recommended. Left lower lobe airspace consolidation versus atelectasis with small left pleural effusion.    MR MRA head without contrast   07/26/2015 1. Acute left MCA infarct centered at the left insula, operculum, and basal ganglia. Cytotoxic edema with petechial hemorrhage and mild regional mass effect. 2. Anterior pericallosal hemorrhage re - demonstrated with estimated blood volume 39 mm (stable from this morning). Stable regional mass effect. 3. Subarachnoid and small volume intraventricular hemorrhage extension. No ventriculomegaly. 4. Negative intracranial MRA aside from leftward mass effect on the ACA A2 segments. No left MCA branch occlusion identified. Chronic left PCA, bithalamic and left midbrain ischemia.  CT head without contrast 07/27/2015 1. Similar appearance of large RIGHT frontal intraparenchymal hematoma with similar mass effect. 2.  Evolving large LEFT MCA territory infarct without hemorrhagic conversion. 3. Stable LEFT occipital lobe encephalomalacia with hemorrhagic conversion. 4. Similar moderate subarachnoid hemorrhage.  EEG 07/26/2015 EEG Abnormalities:  1) attenuation of faster frequencies on the left 2) generalized irregular slow activity Clinical Interpretation: This EEG is consistent with a focal left hemispheric cortical dysfunction in the setting of a generalized nonspecific cerebral dysfunction(encephalopathy). The focal dysfunction seen is consistent with her known history of recent stroke.        PHYSICAL EXAM  Temp:  [99 F (37.2 C)-100.3 F (37.9 C)] 100.3 F (37.9 C) (07/23 0800) Pulse Rate:  [57-110] 85 (07/23 0831) Resp:  [18-33] 20 (07/23 0831) BP: (130-171)/(60-86) 146/71 (07/23 0831) SpO2:  [99 %-100 %] 100 % (07/23 0831) FiO2 (%):  [30 %-40 %] 40 % (07/23 0831)   On exam Gen: Intubated, no sedation. She is unresponsive to verbal, tactile, and noxious stimulation. No spontaneous eye opening. She does not follow commands.  CN: Eyes conjugate. She does not blink to threat. R pupil 2 mm and sluggish. L pupil post-surgical, irregular  and fixed. She has intact corneals, better on the left. She grimaces to supraorbital pressure bilaterally and to suction. Motor: BUE and LLE are rigid. Tone in the RLE is normal to slightly increased. No spontaneous movement is observed.  Sens: She has some shoulder adduction to nailbed pressure in the UEs, R>L. Weak flexion is noted to nailbed pressure in the LEs, better on the R  ASSESSMENT/PLAN [name] is a 70 y.o. 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 She received IV t-PA 2015/07/26 at 1400. She was then taken to IR where she received TICI3 reperfusion of the near occlusion of L ICA terminus and L ACA and complete occlusion of L MCA  using 2 passes of the Solitaire device. Hospital course-she developed vomiting and seizure, head CT showed frontal ICH with SAH and SDH.   Stroke:  Dominant left ICA terminus occlusion s/p IV tPA and TICI3 revascularization of L ICA/MCA/ACA, infarct embolic secondary to unclear source.  Initial CT showed no ICH, but left MCA hyperdense sign, left MCA effacement. ASPECT = 6  MRI  please see above  MRA  Negative intracranial MRA aside from leftward mass effect on the ACA A2 segments.   Cerebral angio - left ICA terminus and ASA near occlusion, and MCA occlusion - s/p TICI3 recannulization  2D Echo  EF 45-50%. No cardiac source of emboli identified. Left atrium at the upper limits of normal size.  LDL 80  HgbA1c 5.9  SCDs ordered for VTE prophylaxis Diet NPO time specified Tube feedings initiated per eLink  aspirin 81 mg daily and clopidogrel 75 mg daily prior to admission, now on No antithrombotic nas within 24h of tPA administration and will not start after 24h given ICH  Ongoing aggressive stroke risk factor management  Therapy recommendations:  pending   Disposition:  pending   Hemorrhagic transformation  CT head immediate post op no ICH  Repeat CT head after vomiting and seizure showed frontal ICH with SAH and SDH  Reported BP was high on albumin and neo at arrival of neuro ICH  Repeat CT 07/26/15 6am showed stable ICH with SAH SDH without hydrocephalus  Repeat CT 07/27/2015 - please see report above  Received FFP x 2  Seizure   GTC x 2 due to ICH  Ativan 2mg  x 1  keppra 1000mg  bid  EEG - as above - consistent with her known history of recent stroke.  Acute Respiratory Failure  Secondary to stroke  Intubated for procedure  Remains on vent and unable to protect airway  Hypertensive Emergency  BP up in the 220s in A-line with good waveform post IR upon arrival to the ICU  Started on cardene at 17:43.   Remained on cardene until about 7a this am  BP  currently Stable, 122/57  BP goal < 140 due to ICH - (BP 148/69 Saturday 07/27/15) ; (BP 146/71 on Sunday 07/28/15)  Hyperlipidemia  Home meds:  lipitor 20 mg daily  LDL 80, goal < 70  Resume statin once feeding tube in place  Continue statin at discharge  Diabetes  HgbA1c 5.9 , goal < 7.0  Controlled  Other Stroke Risk Factors  Advanced age  Hx stroke/TIA  07/2001 - bilateral thalamic and midbrain infarct, no source found. TEE done at that time. 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   Acute blood Loss Anemia  Hgb 9.5->10.9->7.2  Likely secondary to IR and  hydratioin  CCM on board  Close monitoring   Febrile, temperature elevation  TM 102.3 during the night - 100.3 (Sunday)  CXR 07/21/2015 - Mild pulmonary vascular congestion.  Urine - 07/20/2015 - Protein 30 with trace hemoglobin - otherwise unremarkable  Treated with tylenol  WBC 6.9->10.2 -> 13.9 -> 15.2 (Sunday)  Pt on cooling blanket  Currently on no antibiotics  Close monitoring  Repeat CBC on Monday  Check portable CXR today   Other Active Problems  AKI Cr 3 -> 2.67 -> 2.56 (improving)    This patient is critically ill due to left ICA occlusion, left MCA infarct s/p tPA and IR, hemorrhagic transformation s/p tPA, GTC seizure, respiratory failure, anemia and at significant risk of neurological worsening, death form recurrent stroke, cerebral edema, brain herniation, status epilepticus, heart failure. This patient's care requires constant monitoring of vital signs, hemodynamics, respiratory and cardiac monitoring, review of multiple databases, neurological assessment, discussion with family, other specialists and medical decision making of high complexity. I spent 35 minutes of neurocritical care time in the care of this patient.  I had long discussion with family at bedside. Updated pt condition, further testing, treatment plan and prognosis.  They partially agree on comfort care but need time to  discuss with family. Z. Willodean Rosenthal. MDNeurology 07/28/2015 9:18 AM   To contact Stroke Continuity provider, please refer to [email or web address]. After hours, contact General Neurology

## 2015-07-28 NOTE — Progress Notes (Signed)
PT Cancellation Note  Patient Details Name: [name] MRN: [id number] DOB: 04-20-1945   Cancelled Treatment:    Reason Eval/Treat Not Completed: Medical issues which prohibited therapy;Patient not medically ready.  Pt continues to be unresponsive.  PT will sign off.  Please place new PT order once pt more medically appropriate.  Encarnacion Chu PT, DPT  Pager: [phone] Phone: [phone] 07/28/2015, 7:15 AM

## 2015-07-28 NOTE — Evaluation (Signed)
SLP Cancellation Note  Patient Details Name: [name] MRN: [id number] DOB: [birth year]   Cancelled treatment:       Reason Eval/Treat Not Completed: Medical issues which prohibited therapy   Chales Abrahams 07/28/2015, 8:13 AM  Donavan Burnet, MS [facility] SLP [phone]

## 2015-07-29 DIAGNOSIS — G9340 Encephalopathy, unspecified: Secondary | ICD-10-CM

## 2015-07-29 DIAGNOSIS — I10 Essential (primary) hypertension: Secondary | ICD-10-CM

## 2015-07-29 LAB — GLUCOSE, CAPILLARY
GLUCOSE-CAPILLARY: 120 mg/dL — AB (ref 65–99)
GLUCOSE-CAPILLARY: 146 mg/dL — AB (ref 65–99)
Glucose-Capillary: 138 mg/dL — ABNORMAL HIGH (ref 65–99)
Glucose-Capillary: 139 mg/dL — ABNORMAL HIGH (ref 65–99)
Glucose-Capillary: 115 mg/dL — ABNORMAL HIGH (ref 65–99)
Glucose-Capillary: 123 mg/dL — ABNORMAL HIGH (ref 65–99)
Glucose-Capillary: 130 mg/dL — ABNORMAL HIGH (ref 65–99)

## 2015-07-29 MED ORDER — LEVETIRACETAM 500 MG/5ML IV SOLN
500.0000 mg | Freq: Two times a day (BID) | INTRAVENOUS | Status: DC
  Administered 2015-07-29 – 2015-07-31 (×5): 500 mg via INTRAVENOUS
  Filled 2015-07-29 (×6): qty 5

## 2015-07-29 MED ORDER — VITAL AF 1.2 CAL PO LIQD
1000.0000 mL | ORAL | Status: DC
  Administered 2015-07-29: 1000 mL
  Filled 2015-07-29: qty 1000

## 2015-07-29 NOTE — Progress Notes (Signed)
PULMONARY / CRITICAL CARE MEDICINE   Name: [name] MRN: [id number] DOB: Oct 29, 1945    ADMISSION DATE:  2015/08/11 CONSULTATION DATE:  08/11/15  REFERRING MD:  IR  CHIEF COMPLAINT:  stroke  HISTORY OF PRESENT ILLNESS:   [name] is a 70 yo female who was found to be less responsive and weak on her right side by family today 2 hours prior to presentation to the ED. Patient was brought to the ED and was noted to have slurred speech and right sided hemineglect and weakness. A code stroke was called. CT performed and was concerning for left MCA ischemic stroke. TPA was subsequently given. Neurology decided to proceed with endovascular intervention with interventional radiology to retrieve the clot. Interventional radiology performed revascularization and reperfusion was achieved. She was placed on the vent during the procedure. Noted to have a large ICH post op.   PCCM was consulted for management of mechanical ventilation and medical issues.   SUBJECTIVE:  Remains unresponsive off sedation. Currently on PSV.  VITAL SIGNS: BP (!) 152/74   Pulse 64   Temp 99.6 F (37.6 C) (Rectal)   Resp 19   Ht  (1.676 m)   Wt 56 kg (123 lb 7.3 oz)   SpO2 100%   BMI 19.93 kg/m   HEMODYNAMICS:    VENTILATOR SETTINGS: Vent Mode: PRVC FiO2 (%):  [40 %] 40 % Set Rate:  [18 bmp] 18 bmp Vt Set:  [500 mL] 500 mL PEEP:  [5 cmH20] 5 cmH20 Pressure Support:  [5 cmH20] 5 cmH20 Plateau Pressure:  [17 cmH20-20 cmH20] 20 cmH20  INTAKE / OUTPUT: I/O last 3 completed shifts: In: 3725 [I.V.:2625; NG/GT:720; IV Piggyback:380] Out: 2695 [Urine:2695]  PHYSICAL EXAMINATION: General:  Thin female. No distress Neuro:  Comatose, no response to my exam HEENT:  L eye post surgical, R eye 2mm, Moist mucus membranes, No thyromegaly or JVD Cardiovascular:  RRR, No MRG Lungs: Clear, no wheeze or crackles Abdomen:  Soft, NT, ND and +BS. Musculoskeletal:  Decreased muscle bulk, no edema, feet are  cold Skin: No rashes  LABS:  BMET  Recent Labs Lab 08-11-15 1346 2015/08/11 1356 07/26/15 0320 07/28/15 0330  NA 139 141 138 138  K 4.8 4.8 5.0 5.1  CL 113* 109 111 112*  CO2 21*  --  21* 18*  BUN 36* 36* 32* 37*  CREATININE 2.97* 3.00* 2.67* 2.56*  GLUCOSE 123* 118* 134* 103*   Electrolytes  Recent Labs Lab 2015-08-11 1346 07/26/15 0320 07/28/15 0330  CALCIUM 8.6* 8.7* 8.8*   CBC  Recent Labs Lab 07/28/15 0330 07/28/15 0936 07/28/15 1308  WBC 15.2* 21.0* 13.9*  HGB 8.0* 11.2* 8.1*  HCT 25.9* 34.5* 25.9*  PLT 210 20* 221   Coag's  Recent Labs Lab 11-Aug-2015 1346  APTT 21*  INR 1.11   Sepsis Markers No results for input(s): LATICACIDVEN, PROCALCITON, O2SATVEN in the last 168 hours.  ABG  Recent Labs Lab 08-11-15 1912 07/26/15 0410 07/27/15 0620  PHART 7.271* 7.440 7.371  PCO2ART 48.9* 29.6* 32.3*  PO2ART 452.0* 171* 179*   Liver Enzymes  Recent Labs Lab 11-Aug-2015 1346  AST 18  ALT 11*  ALKPHOS 89  BILITOT 0.3  ALBUMIN 3.5    Cardiac Enzymes No results for input(s): TROPONINI, PROBNP in the last 168 hours.  Glucose  Recent Labs Lab 07/28/15 1134 07/28/15 1611 07/28/15 1943 07/28/15 2341 07/29/15 0423 07/29/15 0729  GLUCAP 128* 117* 107* 120* 138* 139*    STUDIES:  7/20 CXR:  Interval repositioning of the endotracheal tube which is now in satisfactory radiographic position. Mild pulmonary vascular congestion. 7/20 CT Head:Early CT findings of a large diffuse left cerebral hemisphere MCA infarct involving the frontal, temporal and parietal lobes with sulcal effacement and loss of gray-white differentiation. Persistent hyper attenuation of the left MCA vasculature.No acute intracranial hemorrhage.  7/21 CT head: Large ICH with SAH extension  CULTURES:  ANTIBIOTICS: None  SIGNIFICANT EVENTS: 7/20: Admit to ICU  LINES/TUBES: ET tube 7/20 > Rt fem A line 7/20>  DISCUSSION: [name] is a 70 yo female who was admitted for L  MCA stroke, s/p tPA and revascularization Developed large ICH with seizures post procedure.   ASSESSMENT / PLAN:  PULMONARY A: Inability to protect airway in post operative setting P:   Full vent support, MS precludes extubation  NEUROLOGIC A:   CVA on left MCA stroke causing aphasia and right hemianopia- s/p tPA and clot retrieval  Hx of Stroke w/ R sided deficits 2005 Large ICH Seizures P:   Sedation > none running RASS goal: 0 to -1 Neurology following Keppra for seizures. Awaiting family to arrive for withdrawal, in the meantime, no further escalation of care.Marland Kitchen   CARDIOVASCULAR A:  Hx of Chronic Diastolic CHF Hx of MI in 2014 Hx of CAD P:  Stable Continue current regiment.  RENAL A:   CKD (baseline Cr ~3) P:   Monitor urine output and Cr. Replete electrolytes as needed KVO IVF.  GASTROINTESTINAL A:   Malnourished Hx of GERD P:   NPO IV Protonix NG tube placement and TF  HEMATOLOGIC A:   Mild Anemia Hx of DVT  P:  SCDs  INFECTIOUS A:   No acute concerns P:   Monitor for fever  ENDOCRINE A:   T2DM (not on any diabetic medications). Last A1C 6 P:   Sensitive SSI  FAMILY  - Updates:  Family updated by Dr Delton Coombes 7/22 and 7/23. They understands poor prognosis and are moving towards withdrawal of care. No further escalation of care, awaiting family to arrive for withdrawal.  - Inter-disciplinary family meet or Palliative Care meeting due by:  7/27  The patient is critically ill with multiple organ systems failure and requires high complexity decision making for assessment and support, frequent evaluation and titration of therapies, application of advanced monitoring technologies and extensive interpretation of multiple databases.   Critical Care Time devoted to patient care services described in this note is  35  Minutes. This time reflects time of care of this signee Dr Koren Bound. This critical care time does not reflect procedure time, or  teaching time or supervisory time of PA/NP/Med student/Med Resident etc but could involve care discussion time.  Alyson Reedy, M.D. [facility] Pulmonary/Critical Care Medicine. Pager: [phone]. After hours pager: [phone].

## 2015-07-29 NOTE — Progress Notes (Signed)
OT Cancellation Note/Discharge  Patient Details Name: [name] MRN: [id number] DOB: [birth year]   Cancelled Treatment:    Reason Eval/Treat Not Completed: Medical issues which prohibited therapy  Pt on vent and not appropriate for therapy at this time per nursing. OT signing off. Please reorder when appropriate. Thanks.  [facility] Francenia Chimenti, OTR/L  [phone] 07/29/2015 07/29/2015, 8:41 AM

## 2015-07-29 NOTE — Progress Notes (Signed)
Nutrition Follow-up  INTERVENTION:   D/C Jevity 1.5 Vital AF 1.2 @ 50 ml/hr Provides: 1200 ml, 1440 kcal, 90 grams protein, and 973 ml H2O.    NUTRITION DIAGNOSIS:   Inadequate oral intake related to inability to eat as evidenced by NPO status. Ongoing.   GOAL:   Patient will meet greater than or equal to 90% of their needs Progressing.   MONITOR:   TF tolerance, Vent status, I & O's  REASON FOR ASSESSMENT:   Consult Enteral/tube feeding initiation and management  ASSESSMENT:   [name] is a 70 yo female who was admitted for L MCA stroke, s/p tPA and revascularization, remains on ventilator after revascularization. Developed large ICH after procedure.   Pt discussed during ICU rounds and with RN.  Plan is to move towards comfort care.   Patient is currently intubated on ventilator support MV: 10.6 L/min Temp (24hrs), Avg:99.5 F (37.5 C), Min:98.8 F (37.1 C), Max:99.8 F (37.7 C)  Medications reviewed Labs reviewed CBG's: 115-138  Diet Order:  Diet NPO time specified  Skin:  Reviewed, no issues  Last BM:  unknown  Height:   Ht Readings from Last 1 Encounters:  08/04/2015 5\' 6"  (1.676 m)    Weight:   Wt Readings from Last 1 Encounters:  07/29/15 123 lb 7.3 oz (56 kg)    Ideal Body Weight:  59 kg  BMI:  Body mass index is 19.93 kg/m.  Estimated Nutritional Needs:   Kcal:  1365  Protein:  80-100 grams  Fluid:  > 1.5 L/day  EDUCATION NEEDS:   No education needs identified at this time  Kendell Bane RD, LDN, CNSC [phone] Pager [phone] After Hours Pager

## 2015-07-29 NOTE — Progress Notes (Signed)
STROKE TEAM PROGRESS NOTE   SUBJECTIVE  Her husband, son and additional family memberes are at the bedside. They report she was 65-70% back to normal following her last stroke ~15 years ago. Family makde DNR yesterday - they are leaning toward comfort care. Her husband does not want er to suffer. They are awaiting a few other other family members input prior to decision on withdrawal of care  OBJECTIVE Temp:  [98.8 F (37.1 C)-99.8 F (37.7 C)] 99.6 F (37.6 C) (07/24 0400) Pulse Rate:  [54-121] 64 (07/24 0700) Cardiac Rhythm: Normal sinus rhythm (07/23 2000) Resp:  [17-26] 19 (07/24 0700) BP: (108-169)/(58-95) 152/74 (07/24 0700) SpO2:  [100 %] 100 % (07/24 0850) FiO2 (%):  [40 %] 40 % (07/24 0850) Weight:  [56 kg (123 lb 7.3 oz)] 56 kg (123 lb 7.3 oz) (07/24 0315)   CBC:   Recent Labs Lab 07/28/15 0936 07/28/15 1308  WBC 21.0* 13.9*  NEUTROABS 19.8* 10.7*  HGB 11.2* 8.1*  HCT 34.5* 25.9*  MCV 93.5 88.1  PLT 20* 221   Basic Metabolic Panel:   Recent Labs Lab 07/26/15 0320 07/28/15 0330  NA 138 138  K 5.0 5.1  CL 111 112*  CO2 21* 18*  GLUCOSE 134* 103*  BUN 32* 37*  CREATININE 2.67* 2.56*  CALCIUM 8.7* 8.8*     IMAGING  Ct Head Wo Contrast 07/27/2015 1. Similar appearance of large RIGHT frontal intraparenchymal hematoma with similar mass effect. 2. Evolving large LEFT MCA territory infarct without hemorrhagic conversion. 3. Stable LEFT occipital lobe encephalomalacia with hemorrhagic conversion. 4. Similar moderate subarachnoid hemorrhage.  07/27/2015   1. Positive for hyperdense left MCA M1/bifurcation, and changes of cytotoxic edema in the left MCA territory. ASPECTS score = 6. 2. No associated hemorrhage or mass effect. 3. Chronic left PCA and bilateral thalamostriate artery territory ischemia.  07/26/2015   1. Mild progression of intra-axial pericallosal hemorrhage since 2357 hours yesterday. Intra-axial blood volume there estimated at 42 mL (versus 30  mL previously). Anterior mass effect on the lateral ventricles and septum pellucidum. 2. Bilateral subarachnoid hemorrhage extension. No intraventricular extension. No ventriculomegaly. 3. Probable small volume of intra-axial hemorrhage also in the encephalomalacia left occipital lobe. 4. Left MCA territory infarct appears fairly limited to the left basal ganglia, insula, and anterior operculum. 5. Underlying chronic small vessel disease.  07/26/2015    1. There is a large right pericallosal hemorrhage, likely due to a hemorrhagic infarct. There is a large amount of subarachnoid hemorrhage, mostly at the midline along both sides of the falx. There is new partial effacement of the quadrigeminal plate cistern. No hydrocephalus. 2. Developing infarct along the anterior horn of the left temporal lobe. The remainder of the left MCA territory cortex appears to be intact. 3. Increased density remains in the left MCA versus the right.   07/26/2015 Early CT findings of a large diffuse left cerebral hemisphere MCA infarct involving the frontal, temporal and parietal lobes with sulcal effacement and loss of gray-white differentiation. Persistent hyper attenuation of the left MCA vasculature. No acute intracranial hemorrhage.   Cerebral Angiogram  07/11/2015 1.Near complete occlusion of LT ICA terminus,and Lt ACA prox ,and complete occlusion of LT MCA with complete revascularization of LT MCA,LT ACA and LT ICA terminus achieving a TICI 3 reperfusion with x 2 passes with Solitaire 4 mm x 40 mm SR retrieval device   Dg Chest Port 1 View 07/27/2015 Increase in left retrocardiac airspace disease with possible small left pleural effusion.  This could represent atelectasis or developing infection, given history of fever. Aortic atherosclerosis.  07/24/2015  Interval repositioning of the endotracheal tube which is now in satisfactory radiographic position. Mild pulmonary vascular congestion.   07/30/2015   Interval  intubation with endotracheal tube within the proximal right mainstem bronchus. Retraction by 4.5 cm is recommended. Left lower lobe airspace consolidation versus atelectasis with small left pleural effusion.   MRI & MRA head without contrast   07/26/2015 1. Acute left MCA infarct centered at the left insula, operculum, and basal ganglia. Cytotoxic edema with petechial hemorrhage and mild regional mass effect. 2. Anterior pericallosal hemorrhage re - demonstrated with estimated blood volume 39 mm (stable from this morning). Stable regional mass effect. 3. Subarachnoid and small volume intraventricular hemorrhage extension. No ventriculomegaly. 4. Negative intracranial MRA aside from leftward mass effect on the ACA A2 segments. No left MCA branch occlusion identified. Chronic left PCA, bithalamic and left midbrain ischemia.  EEG 07/26/2015 EEG Abnormalities:  1) attenuation of faster frequencies on the left 2) generalized irregular slow activity Clinical Interpretation: This EEG is consistent with a focal left hemispheric cortical dysfunction in the setting of a generalized nonspecific cerebral dysfunction(encephalopathy). The focal dysfunction seen is consistent with her known history of recent stroke.   PHYSICAL EXAM Gen: Intubated, no sedation.  . Afebrile. Head is nontraumatic. Neck is supple without bruit.    Cardiac exam no murmur or gallop. Lungs are clear to auscultation. Distal pulses are well felt. Neurological Exam :  She is unresponsive to verbal, tactile, and noxious stimulation. No spontaneous eye opening. She does not follow commands.  CN: Eyes conjugate. She does not blink to threat. R pupil 2 mm and sluggish. L pupil post-surgical, irregular and fixed. She has intact corneals, better on the left. She grimaces to supraorbital pressure bilaterally and to suction. Motor: BUE and LLE are rigid. Tone in the RLE is normal to slightly increased. No spontaneous movement is observed.   Sens: She has some shoulder adduction to nailbed pressure in the UEs, R>L. Weak flexion is noted to nailbed pressure in the LEs, better on the R   ASSESSMENT/PLAN Ms. Yer Castello is a 70 y.o. 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 She received IV t-PA 07/06/2015 at 1400. She was then taken to IR where she received TICI3 reperfusion of the near occlusion of L ICA terminus and L ACA and complete occlusion of L MCA using 2 passes of the Solitaire device. Hospital course-she developed vomiting and seizure, head CT showed frontal ICH with SAH and SDH.   Stroke:  Dominant left MCA infarct secondary to L ICA terminus occlusion s/p IV tPA and TICI3 revascularization of L ICA/MCA/ACA, infarct embolic secondary to unclear source. Post tPA symptomatic hemorrhage.  Initial CT showed no ICH, but left MCA hyperdense sign, left MCA effacement. ASPECT = 6  MRI  L MCA infarct without HT, large R frontal IPH with mass effect, and cytotoxic edema and brain herniation  MRA  Negative intracranial MRA aside from leftward mass effect on the ACA A2 segments.   Cerebral angio - left ICA terminus and ASA near occlusion, and MCA occlusion - s/p TICI3 recannulization  2D Echo  EF 45-50%. No cardiac source of emboli identified. Left atrium at the upper limits of normal size.  LDL 80  HgbA1c 5.9  SCDs ordered for VTE prophylaxis Diet NPO time specified , on  tube feedings   aspirin 81 mg daily and clopidogrel 75 mg daily prior to admission, now on No antithrombotic given post tPA ICH  Ongoing aggressive stroke risk factor management  Therapy recommendations:  pending   Disposition:  pending   Neurologic prognosis for meaning recovery is limited. Dr. Pearlean Brownie discussed at length with family. Son asked many questions.    Post tPA Hemorrhage, Symptomatic  CT head immediate post op no ICH  Repeat CT  head after vomiting and seizure showed frontal ICH with SAH and SDH  Reported BP was high on albumin and neo at arrival of neuro ICU  Received FFP x 2  Repeat CT 07/26/15 6am showed stable ICH with SAH SDH without hydrocephalus, no in location of initial infarct  Repeat CT 07/27/2015 - R IPH similar, evolving L MCA infarct w/o HT, stable L occipital encephalomalacia, moderate SAH stable  Seizure   GTC x 2 due to ICH  Ativan 2mg  x 1  keppra 1000mg  bid  EEG - no sz - consistent with her known history of recent stroke.  Acute Respiratory Failure  Secondary to stroke  Initially intubated for procedure  Remains on vent and unable to protect airway  CCM on board  May need Trach discussed with family   Hypertensive Emergency  BP up in the 220s in A-line with good waveform post IR upon arrival to the ICU  Started on cardene at 17:43.   Remained on cardene until about 7a this am  BP up to 168/67 this am, 146/71 this am*  BP goal < 180 due to ICH -   Hyperlipidemia  Home meds:  lipitor 20 mg daily  LDL 80, goal < 70  Resume statin once stable, based on plan of care  Continue statin at discharge  Diabetes  HgbA1c 5.9 , goal < 7.0  Controlled  Other Stroke Risk Factors  Advanced age  Hx stroke/TIA  07/2001 - bilateral thalamic and midbrain infarct, no source found. TEE done at that time. 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   Acute blood Loss Anemia  Hgb 9.5->10.9->7.2->8.1  Likely secondary to IR and hydratioin  CCM on board  Close monitoring  Febrile, temperature elevation  TM 102.3 during hospitalization, 100.3 (past 24h)  CXR 07/28/2015 - increase L retrocardiac airspace dx w/ possible small L pleural effusion  Urine - 07/21/2015 - Protein 30 with trace hemoglobin - otherwise unremarkable  Treated with tylenol  WBC 6.9->10.2 -> 13.9 -> 13.9 (Sunday)  Currently on no antibiotics  Close monitoring  Repeat CBC on Monday  Other Active  Problems  AKI Cr 3 -> 2.67 -> 2.56 (improving)  BIBY,SHARON   Stroke Center See Amion for Pager information 07/29/2015 9:51 AM  I have personally examined this patient, reviewed notes, independently viewed imaging studies, participated in medical decision making and plan of care. I have made any additions or clarifications directly to the above note. Agree with note above.  The patient remains unresponsive and comatose with appear neurological exam and her prognosis for making significant meaningful improvement in his quite poor. I had a long discussion with the patient's husband and son at the bedside and answered questions about her prognosis. Family agrees to DO NOT RESUSCITATE and thinking about palliative care but await discussion with other family members and how to make a decision soon over the next few days. Continue present care but do not escalate care. Discussed with Dr. Molli Knock. This patient is critically ill and at significant risk of neurological worsening,  death and care requires constant monitoring of vital signs, hemodynamics,respiratory and cardiac monitoring, extensive review of multiple databases, frequent neurological assessment, discussion with family, other specialists and medical decision making of high complexity.I have made any additions or clarifications directly to the above note.This critical care time does not reflect procedure time, or teaching time or supervisory time of PA/NP/Med Resident etc but could involve care discussion time.  I spent 40 minutes of neurocritical care time  in the care of  this patient.      Delia Heady, MD Medical Director [facility] Pager: [phone] 07/29/2015 2:52 PM   To contact Stroke Continuity provider, please refer to [email or web address]. After hours, contact General Neurology

## 2015-07-30 ENCOUNTER — Inpatient Hospital Stay (HOSPITAL_COMMUNITY): Payer: Medicare HMO

## 2015-07-30 ENCOUNTER — Encounter (HOSPITAL_COMMUNITY): Payer: Self-pay | Admitting: Interventional Radiology

## 2015-07-30 DIAGNOSIS — Z515 Encounter for palliative care: Secondary | ICD-10-CM

## 2015-07-30 LAB — BLOOD GAS, ARTERIAL
ACID-BASE DEFICIT: 6.2 mmol/L — AB (ref 0.0–2.0)
BICARBONATE: 17.5 meq/L — AB (ref 20.0–24.0)
Drawn by: 398661
FIO2: 0.4
O2 Saturation: 99.3 %
PEEP/CPAP: 5 cmH2O
PH ART: 7.41 (ref 7.350–7.450)
Patient temperature: 98.6
RATE: 18 resp/min
TCO2: 18.4 mmol/L (ref 0–100)
VT: 500 mL
pCO2 arterial: 28.2 mmHg — ABNORMAL LOW (ref 35.0–45.0)
pO2, Arterial: 189 mmHg — ABNORMAL HIGH (ref 80.0–100.0)

## 2015-07-30 LAB — BASIC METABOLIC PANEL
ANION GAP: 6 (ref 5–15)
BUN: 36 mg/dL — ABNORMAL HIGH (ref 6–20)
CHLORIDE: 113 mmol/L — AB (ref 101–111)
CO2: 19 mmol/L — ABNORMAL LOW (ref 22–32)
Calcium: 8.5 mg/dL — ABNORMAL LOW (ref 8.9–10.3)
Creatinine, Ser: 2.33 mg/dL — ABNORMAL HIGH (ref 0.44–1.00)
GFR calc non Af Amer: 20 mL/min — ABNORMAL LOW (ref >60)
GFR, EST AFRICAN AMERICAN: 23 mL/min — AB (ref >60)
Glucose, Bld: 129 mg/dL — ABNORMAL HIGH (ref 65–99)
POTASSIUM: 4.3 mmol/L (ref 3.5–5.1)
SODIUM: 138 mmol/L (ref 135–145)

## 2015-07-30 LAB — TYPE AND SCREEN
ABO/RH(D): O POS
Antibody Screen: NEGATIVE
PT AG Type: NEGATIVE
UNIT DIVISION: 0
Unit division: 0

## 2015-07-30 LAB — CBC
HEMATOCRIT: 23.3 % — AB (ref 36.0–46.0)
HEMOGLOBIN: 7.5 g/dL — AB (ref 12.0–15.0)
MCH: 27.9 pg (ref 26.0–34.0)
MCHC: 32.2 g/dL (ref 30.0–36.0)
MCV: 86.6 fL (ref 78.0–100.0)
Platelets: 241 10*3/uL (ref 150–400)
RBC: 2.69 MIL/uL — AB (ref 3.87–5.11)
RDW: 13.4 % (ref 11.5–15.5)
WBC: 11.8 10*3/uL — AB (ref 4.0–10.5)

## 2015-07-30 LAB — GLUCOSE, CAPILLARY
GLUCOSE-CAPILLARY: 122 mg/dL — AB (ref 65–99)
Glucose-Capillary: 110 mg/dL — ABNORMAL HIGH (ref 65–99)
Glucose-Capillary: 129 mg/dL — ABNORMAL HIGH (ref 65–99)
Glucose-Capillary: 134 mg/dL — ABNORMAL HIGH (ref 65–99)
Glucose-Capillary: 82 mg/dL (ref 65–99)
Glucose-Capillary: 119 mg/dL — ABNORMAL HIGH (ref 65–99)

## 2015-07-30 LAB — PHOSPHORUS: Phosphorus: 2.3 mg/dL — ABNORMAL LOW (ref 2.5–4.6)

## 2015-07-30 LAB — MAGNESIUM: Magnesium: 1.5 mg/dL — ABNORMAL LOW (ref 1.7–2.4)

## 2015-07-30 MED ORDER — ENOXAPARIN SODIUM 30 MG/0.3ML ~~LOC~~ SOLN
30.0000 mg | SUBCUTANEOUS | Status: DC
  Administered 2015-07-30 – 2015-07-31 (×2): 30 mg via SUBCUTANEOUS
  Filled 2015-07-30 (×2): qty 0.3

## 2015-07-30 NOTE — Progress Notes (Signed)
STROKE TEAM PROGRESS NOTE   SUBJECTIVE  No family at bedside this am. RN reports she is unchanged wtihout new abnormalities, difficulties. She has not heard from the family. Awaiting for family to decide to withdraw - now DNR without planned escalation.   OBJECTIVE Temp:  [98.9 F (37.2 C)-102.3 F (39.1 C)] 100 F (37.8 C) (07/25 0400) Pulse Rate:  [71-124] 99 (07/25 0800) Cardiac Rhythm: Normal sinus rhythm (07/25 0800) Resp:  [16-35] 25 (07/25 0800) BP: (112-169)/(47-99) 151/79 (07/25 0800) SpO2:  [100 %] 100 % (07/25 0854) FiO2 (%):  [40 %] 40 % (07/25 0854) Weight:  [56.8 kg (125 lb 3.5 oz)] 56.8 kg (125 lb 3.5 oz) (07/25 0250)   CBC:   Recent Labs Lab 07/28/15 0936 07/28/15 1308 07/30/15 0344  WBC 21.0* 13.9* 11.8*  NEUTROABS 19.8* 10.7*  --   HGB 11.2* 8.1* 7.5*  HCT 34.5* 25.9* 23.3*  MCV 93.5 88.1 86.6  PLT 20* 221 241   Basic Metabolic Panel:   Recent Labs Lab 07/28/15 0330 07/30/15 0344  NA 138 138  K 5.1 4.3  CL 112* 113*  CO2 18* 19*  GLUCOSE 103* 129*  BUN 37* 36*  CREATININE 2.56* 2.33*  CALCIUM 8.8* 8.5*  MG  --  1.5*  PHOS  --  2.3*     IMAGING  Dg Chest Port 1 View  Result Date: 07/30/2015 CLINICAL DATA:  CVA, intubated patient, history of Coronary artery disease and diabetes, asthma EXAM: PORTABLE CHEST 1 VIEW COMPARISON:  Portable chest x-ray of July 28, 2015 FINDINGS: The lungs are adequately inflated. There is subsegmental atelectasis at the right lung base. There is hazy increased density in the right upper lobe overlying the anterior aspect of the first rib. The left lung is clear. The heart and pulmonary vascularity are normal. The endotracheal tube tip lies 4 cm above the carina. The esophagogastric tube tip projects below the inferior margin of the image. IMPRESSION: Slight improved aeration today as compared to yesterday's study. Decreased left lower lobe atelectasis. Persistent subsegmental atelectasis or early infiltrate in the  right infrahilar region and right upper lobe Electronically Signed   By: David  [place] M.D.   On: 07/30/2015 07:44     PHYSICAL EXAM  Intubated,  .  . Afebrile. Head is nontraumatic. Neck is supple without bruit.    Cardiac exam no murmur or gallop. Lungs are clear to auscultation. Distal pulses are well felt. Neurological Exam :  She is unresponsive to verbal, tactile, and noxious stimulation. Eyes are open.. She does not follow commands. Globally aphasic CN: Eyes conjugate. She does not blink to threat. R pupil 2 mm and sluggish. L pupil post-surgical, irregular and fixed. She has intact corneals, better on the left. She grimaces to supraorbital pressure bilaterally and to suction. Motor: BUE and LLE are rigid. Tone in the RLE is normal to slightly increased. No spontaneous movement is observed.  Sens: She has some shoulder adduction to nailbed pressure in the UEs, R>L. Weak flexion is noted to nailbed pressure in the LEs, better on the R   ASSESSMENT/PLAN Ms. Jahnessa Vanduyn is a 70 y.o. 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 She received IV t-PA 07/14/2015 at 1400. She was then taken to IR where she received TICI3 reperfusion of the near occlusion of L ICA terminus and L ACA and complete occlusion of L MCA using 2 passes of  the Solitaire device. Hospital course-she developed vomiting and seizure, head CT showed frontal ICH with SAH and SDH.   Stroke:  Dominant left MCA infarct secondary to L ICA terminus occlusion s/p IV tPA and TICI3 revascularization of L ICA/MCA/ACA, infarct embolic secondary to unclear source. Post tPA symptomatic hemorrhage.  Initial CT showed no ICH, but left MCA hyperdense sign, left MCA effacement. ASPECT = 6  MRI  L MCA infarct without HT, large R frontal IPH with mass effect, and cytotoxic edema and brain herniation  MRA  Negative intracranial MRA aside  from leftward mass effect on the ACA A2 segments.   Cerebral angio - left ICA terminus and ASA near occlusion, and MCA occlusion - s/p TICI3 recannulization  2D Echo  EF 45-50%. No cardiac source of emboli identified. Left atrium at the upper limits of normal size.  LDL 80  HgbA1c 5.9  SCDs for VTE prophylaxis. Add lovenox. Diet NPO time specified , on tube feedings   aspirin 81 mg daily and clopidogrel 75 mg daily prior to admission, now on No antithrombotic given post tPA ICH  Ongoing aggressive stroke risk factor management  Therapy recommendations:  pending   Disposition:  pending   Neurologic prognosis for meaning recovery is poor. Awaiting family to decide on comfort care (they are waiting on additional family members to arrive). Agreeable to not escalate care.  Post tPA Hemorrhage, Symptomatic  CT head immediate post op no ICH  Repeat CT head after vomiting and seizure showed frontal ICH with SAH and SDH  Reported BP was high on albumin and neo at arrival of neuro ICU  Received FFP x 2  Repeat CT 07/26/15 6am showed stable ICH with SAH SDH without hydrocephalus, no in location of initial infarct  Repeat CT 07/27/2015 - R IPH similar, evolving L MCA infarct w/o HT, stable L occipital encephalomalacia, moderate SAH stable  Seizure   GTC x 2 due to ICH  Ativan 2mg  x 1  keppra 1000mg  bid  EEG - no sz - consistent with her known history of recent stroke.  Acute Respiratory Failure  Secondary to stroke  Initially intubated for procedure  Remains on vent and unable to protect airway  CCM on board  May need Trach discussed with family   Hypertensive Emergency  BP up in the 220s in A-line with good waveform post IR upon arrival to the ICU  Initially treated with cardene, now off  BP goal < 180 due to ICH -   Hyperlipidemia  Home meds:  lipitor 20 mg daily  LDL 80, goal < 70  Resume statin once stable, based on plan of care  Continue statin at  discharge depending on plan of care  Diabetes  HgbA1c 5.9 , goal < 7.0  Controlled  Other Stroke Risk Factors  Advanced age  Hx stroke/TIA  07/2001 - bilateral thalamic and midbrain infarct, no source found. TEE done at that time. 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   Acute blood Loss Anemia  Hgb 9.5->10.9->7.2->7.5  Likely secondary to IR and hydratioin  Febrile, temperature elevation  TM 102.3   CXR 07/28/2015 - increase L retrocardiac airspace dx w/ possible small L pleural effusion  Urine - 07/16/2015 - Protein 30 with trace hemoglobin - otherwise unremarkable  Treated with tylenol  WBC 6.9->10.2 -> 13.9 -> 11.8  Currently on no antibiotics  Close monitoring  Other Active Problems  AKI Cr 3 -> 2.33 (improving)  [facility] See Amion for Pager  information 07/30/2015 9:02 AM  I have personally examined this patient, reviewed notes, independently viewed imaging studies, participated in medical decision making and plan of care. I have made any additions or clarifications directly to the above note. Agree with note above.  Patient`s exam remains unchanged. She remains globally aphasic and chances of survival and making meaningful recovery quite slim. I had a long discussion with the patient's husband, son as well as 2 sisters and a niece about her condition, prognosis and answered questions. Family seems realistic and would not like prolonged ventilatory support and nursing home care which is unavoidable. They await discussion with other family members and plan to meet with me tomorrow and make a final decision on withdrawal of care. Discussed with Dr. Molli Knock This patient is critically ill and at significant risk of neurological worsening, death and care requires constant monitoring of vital signs, hemodynamics,respiratory and cardiac monitoring, extensive review of multiple databases, frequent neurological assessment, discussion with family, other  specialists and medical decision making of high complexity.I have made any additions or clarifications directly to the above note.This critical care time does not reflect procedure time, or teaching time or supervisory time of PA/NP/Med Resident etc but could involve care discussion time.  I spent 40 minutes of neurocritical care time  in the care of  this patient.     Delia Heady, MD Medical Director [facility] Pager: [phone] 07/30/2015 12:56 PM  To contact Stroke Continuity provider, please refer to [email or web address]. After hours, contact General Neurology

## 2015-07-30 NOTE — Progress Notes (Signed)
PULMONARY / CRITICAL CARE MEDICINE   Name: [name] MRN: [id number] DOB: [birth year]    ADMISSION DATE:  08/03/2015 CONSULTATION DATE:  07/19/2015  REFERRING MD:  IR  CHIEF COMPLAINT:  stroke  HISTORY OF PRESENT ILLNESS:   [name] is a 70 yo female who was found to be less responsive and weak on her right side by family today 2 hours prior to presentation to the ED. Patient was brought to the ED and was noted to have slurred speech and right sided hemineglect and weakness. A code stroke was called. CT performed and was concerning for left MCA ischemic stroke. TPA was subsequently given. Neurology decided to proceed with endovascular intervention with interventional radiology to retrieve the clot. Interventional radiology performed revascularization and reperfusion was achieved. She was placed on the vent during the procedure. Noted to have a large ICH post op.   PCCM was consulted for management of mechanical ventilation and medical issues.   SUBJECTIVE:  Remains unresponsive off sedation. Failed weaning this AM.  VITAL SIGNS: BP (!) 151/79   Pulse 99   Temp 100 F (37.8 C) (Rectal)   Resp (!) 25   Ht  (1.676 m)   Wt 56.8 kg (125 lb 3.5 oz)   SpO2 100%   BMI 20.21 kg/m   HEMODYNAMICS:    VENTILATOR SETTINGS: Vent Mode: PRVC FiO2 (%):  [40 %] 40 % Set Rate:  [18 bmp] 18 bmp Vt Set:  [500 mL] 500 mL PEEP:  [5 cmH20] 5 cmH20 Plateau Pressure:  [18 cmH20-19 cmH20] 18 cmH20  INTAKE / OUTPUT: I/O last 3 completed shifts: In: 4185 [I.V.:2625; Other:80; NG/GT:1160; IV Piggyback:320] Out: 2720 [Urine:2720]  PHYSICAL EXAMINATION: General:  Thin female. No distress Neuro:  Comatose, no response to my exam HEENT:  L eye post surgical, R eye 2mm, Moist mucus membranes, No thyromegaly or JVD Cardiovascular:  RRR, No MRG Lungs: Clear, no wheeze or crackles Abdomen:  Soft, NT, ND and +BS. Musculoskeletal:  Decreased muscle bulk, no edema, feet are cold Skin: No  rashes  LABS:  BMET  Recent Labs Lab 07/26/15 0320 07/28/15 0330 07/30/15 0344  NA 138 138 138  K 5.0 5.1 4.3  CL 111 112* 113*  CO2 21* 18* 19*  BUN 32* 37* 36*  CREATININE 2.67* 2.56* 2.33*  GLUCOSE 134* 103* 129*   Electrolytes  Recent Labs Lab 07/26/15 0320 07/28/15 0330 07/30/15 0344  CALCIUM 8.7* 8.8* 8.5*  MG  --   --  1.5*  PHOS  --   --  2.3*   CBC  Recent Labs Lab 07/28/15 0936 07/28/15 1308 07/30/15 0344  WBC 21.0* 13.9* 11.8*  HGB 11.2* 8.1* 7.5*  HCT 34.5* 25.9* 23.3*  PLT 20* 221 241   Coag's  Recent Labs Lab 07/20/2015 1346  APTT 21*  INR 1.11   Sepsis Markers No results for input(s): LATICACIDVEN, PROCALCITON, O2SATVEN in the last 168 hours.  ABG  Recent Labs Lab 07/26/15 0410 07/27/15 0620 07/30/15 0428  PHART 7.440 7.371 7.410  PCO2ART 29.6* 32.3* 28.2*  PO2ART 171* 179* 189*   Liver Enzymes  Recent Labs Lab 07/12/2015 1346  AST 18  ALT 11*  ALKPHOS 89  BILITOT 0.3  ALBUMIN 3.5    Cardiac Enzymes No results for input(s): TROPONINI, PROBNP in the last 168 hours.  Glucose  Recent Labs Lab 07/29/15 1115 07/29/15 1533 07/29/15 1933 07/29/15 2311 07/30/15 0317 07/30/15 0800  GLUCAP 115* 146* 123* 130* 110* 129*    STUDIES:  7/20 CXR: Interval repositioning of the endotracheal tube which is now in satisfactory radiographic position. Mild pulmonary vascular congestion. 7/20 CT Head:Early CT findings of a large diffuse left cerebral hemisphere MCA infarct involving the frontal, temporal and parietal lobes with sulcal effacement and loss of gray-white differentiation. Persistent hyper attenuation of the left MCA vasculature.No acute intracranial hemorrhage.  7/21 CT head: Large ICH with SAH extension  CULTURES:  ANTIBIOTICS: None  SIGNIFICANT EVENTS: 7/20: Admit to ICU  LINES/TUBES: ET tube 7/20 > Rt fem A line 7/20>  DISCUSSION: [name] is a 70 yo female who was admitted for L MCA stroke, s/p tPA and  revascularization Developed large ICH with seizures post procedure.   ASSESSMENT / PLAN:  PULMONARY A: Inability to protect airway in post operative setting P:   Full vent support, MS precludes extubation Hold weaning, likely terminal extubation when family is in from out of town.  NEUROLOGIC A:   CVA on left MCA stroke causing aphasia and right hemianopia- s/p tPA and clot retrieval  Hx of Stroke w/ R sided deficits 2005 Large ICH Seizures P:   Sedation > none running RASS goal: 0 to -1 Neurology following Keppra for seizures. Awaiting family to arrive for withdrawal, in the meantime, no further escalation of care and per neuro a full DNR.  CARDIOVASCULAR A:  Hx of Chronic Diastolic CHF Hx of MI in 2014 Hx of CAD  P:  Stable Continue current regiment.  RENAL A:   CKD (baseline Cr ~3)  P:   Monitor urine output and Cr. Replete electrolytes as needed KVO IVF. No further blood draws given prognosis.  GASTROINTESTINAL A:   Malnourished Hx of GERD P:   NPO IV Protonix NG tube placement and TF  HEMATOLOGIC A:   Mild Anemia Hx of DVT   P:  SCDs  INFECTIOUS A:   No acute concerns  P:   Monitor for fever  ENDOCRINE A:   T2DM (not on any diabetic medications). Last A1C 6  P:   Sensitive SSI  FAMILY  - Updates:  Family updated by Dr Pearlean Brownie 7/25, full DNR and extubate when family arrives from out of town with transition to comfort.  - Inter-disciplinary family meet or Palliative Care meeting due by:  7/27  The patient is critically ill with multiple organ systems failure and requires high complexity decision making for assessment and support, frequent evaluation and titration of therapies, application of advanced monitoring technologies and extensive interpretation of multiple databases.   Critical Care Time devoted to patient care services described in this note is  35  Minutes. This time reflects time of care of this signee Dr Koren Bound.  This critical care time does not reflect procedure time, or teaching time or supervisory time of PA/NP/Med student/Med Resident etc but could involve care discussion time.  Alyson Reedy, M.D. [facility] Pulmonary/Critical Care Medicine. Pager: [phone]. After hours pager: [phone].

## 2015-07-30 NOTE — Care Management Important Message (Signed)
Important Message  Patient Details  Name: [name] MRN: [id number] Date of Birth: March 09, 1945   Medicare Important Message Given:  Yes    Kyla Balzarine 07/30/2015, 10:37 AM

## 2015-07-30 NOTE — Plan of Care (Signed)
Problem: Nutrition: Goal: Dietary intake will improve Outcome: Completed/Met Date Met: 07/30/15 Tube feeding intiated 07/27/15, tolerating Vital AF1.2 @ 50 ml/hr

## 2015-07-30 NOTE — Progress Notes (Signed)
   07/30/15 0905  Clinical Encounter Type  Visited With Patient  Visit Type Initial  On morning rounds Chaplain prayed for patient.

## 2015-07-30 NOTE — Progress Notes (Signed)
Speech Language Pathology Discharge Patient Details Name: [name] MRN: [id number] DOB: 1945-03-09 Today's Date: 07/30/2015 Time:  -     Patient discharged from SLP services secondary to: pt intubated, no sedation, non responsive to stimuli per neurology note. Per note, awaiting family decision to withdraw care. Will discharge.  Please see latest therapy progress note for current level of functioning and progress toward goals.    Progress and discharge plan discussed with patient and/or caregiver: Patient unable to participate in discharge planning and no caregivers available  GO     Royce Macadamia 07/30/2015, 9:47 AM   Breck Coons Lonell Face.Ed [employer] [phone]

## 2015-07-30 NOTE — Progress Notes (Signed)
   07/30/15 1057  Clinical Encounter Type  Visited With Patient and family together;Health care provider  Visit Type Initial;Critical Care  Referral From Nurse   Chaplain met with patient and patient's family. Patient may be heading towards comfort care, and family indicated they were fine at this time. Chaplain introduced spiritual care services. Spiritual care services available as needed.   Jeri Lager, Chaplain 07/30/15 10:58 AM

## 2015-07-31 LAB — GLUCOSE, CAPILLARY
GLUCOSE-CAPILLARY: 127 mg/dL — AB (ref 65–99)
Glucose-Capillary: 132 mg/dL — ABNORMAL HIGH (ref 65–99)
Glucose-Capillary: 150 mg/dL — ABNORMAL HIGH (ref 65–99)

## 2015-07-31 MED ORDER — MORPHINE BOLUS VIA INFUSION
5.0000 mg | INTRAVENOUS | Status: DC | PRN
  Filled 2015-07-31: qty 20

## 2015-07-31 MED ORDER — MORPHINE SULFATE 25 MG/ML IV SOLN
10.0000 mg/h | INTRAVENOUS | Status: DC
  Administered 2015-07-31: 10 mg/h via INTRAVENOUS
  Filled 2015-07-31: qty 10

## 2015-08-06 NOTE — Progress Notes (Signed)
Family at bedside when patient passed at 2139. Asystole confirmed with 2 nurses. Vicky Berrier with CDS called with time of death. ME, Tim, notified and will not be an ME case. Notified attending physician.

## 2015-08-06 NOTE — Progress Notes (Signed)
   07/28/2015 1224  Clinical Encounter Type  Visited With Patient not available;Health care provider  Visit Type Follow-up;Patient actively dying;Critical Care  Referral From Physician   Chaplain is aware of end-of-life consult. Family is not at bedside currently. Chaplain will seek to follow-up again this afternoon. Spiritual care services available as needed.   Alda Ponder, Chaplain 07/21/2015 12:25 PM

## 2015-08-06 NOTE — Progress Notes (Signed)
STROKE TEAM PROGRESS NOTE   SUBJECTIVE  Patient's neurological status remains unchanged. She is not waking up on following any commands. Vital signs have been stable. Family is leaning towards comfort care   OBJECTIVE Temp:  [99.9 F (37.7 C)-101.3 F (38.5 C)] 101.3 F (38.5 C) (07/25 1600) Pulse Rate:  [75-130] 106 (07/26 0800) Cardiac Rhythm: Normal sinus rhythm (07/26 0800) Resp:  [18-42] 29 (07/26 0800) BP: (121-174)/(60-97) 148/74 (07/26 0800) SpO2:  [96 %-100 %] 96 % (07/26 0800) FiO2 (%):  [40 %] 40 % (07/26 0422)   CBC:   Recent Labs Lab 07/28/15 0936 07/28/15 1308 07/30/15 0344  WBC 21.0* 13.9* 11.8*  NEUTROABS 19.8* 10.7*  --   HGB 11.2* 8.1* 7.5*  HCT 34.5* 25.9* 23.3*  MCV 93.5 88.1 86.6  PLT 20* 221 241   Basic Metabolic Panel:   Recent Labs Lab 07/28/15 0330 07/30/15 0344  NA 138 138  K 5.1 4.3  CL 112* 113*  CO2 18* 19*  GLUCOSE 103* 129*  BUN 37* 36*  CREATININE 2.56* 2.33*  CALCIUM 8.8* 8.5*  MG  --  1.5*  PHOS  --  2.3*    IMAGING  No results found.    PHYSICAL EXAM  Intubated,  .  . Afebrile. Head is nontraumatic. Neck is supple without bruit.    Cardiac exam no murmur or gallop. Lungs are clear to auscultation. Distal pulses are well felt. Neurological Exam :  She is intubated and eyes closed,unresponsive to verbal, tactile, and noxious stimulation.  .. She does not follow commands. Globally aphasic CN: Eyes conjugate. She does not blink to threat. R pupil 2 mm and sluggish. L pupil post-surgical, irregular and fixed. She has intact corneals, better on the left. She grimaces to supraorbital pressure bilaterally and to suction. Motor: BUE and LLE are rigid. Tone in the RLE is normal to slightly increased. No spontaneous movement is observed.  Sens: She has some shoulder adduction to nailbed pressure in the UEs, R>L. Weak flexion is noted to nailbed pressure in the LEs, better on the R   ASSESSMENT/PLAN Ms. Nyna Mundis is a 70 y.o.  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 She received IV t-PA 08-16-2015 at 1400. She was then taken to IR where she received TICI3 reperfusion of the near occlusion of L ICA terminus and L ACA and complete occlusion of L MCA using 2 passes of the Solitaire device. Hospital course-she developed vomiting and seizure, head CT showed frontal ICH with SAH and SDH.   Stroke:  Dominant left MCA infarct secondary to L ICA terminus occlusion s/p IV tPA and TICI3 revascularization of L ICA/MCA/ACA, infarct embolic secondary to unclear source. Post tPA symptomatic hemorrhage.  Initial CT showed no ICH, but left MCA hyperdense sign, left MCA effacement. ASPECT = 6  MRI  L MCA infarct without HT, large R frontal IPH with mass effect, and cytotoxic edema and brain herniation  MRA  Negative intracranial MRA aside from leftward mass effect on the ACA A2 segments.   Cerebral angio - left ICA terminus and ASA near occlusion, and MCA occlusion - s/p TICI3 recannulization  2D Echo  EF 45-50%. No cardiac source of emboli identified. Left atrium at the upper limits of normal size.  LDL 80  HgbA1c 5.9  SCDs for VTE prophylaxis. Add lovenox. Diet NPO time specified , on tube feedings   aspirin 81  mg daily and clopidogrel 75 mg daily prior to admission, now on No antithrombotic given post tPA ICH  Ongoing aggressive stroke risk factor management  Therapy recommendations:  pending   Disposition:  pending   Neurologic prognosis for meaning recovery is poor. Awaiting family to decide on comfort care (they are waiting on additional family members to arrive). Agreeable to not escalate care.  Post tPA Hemorrhage, Symptomatic  CT head immediate post op no ICH  Repeat CT head after vomiting and seizure showed frontal ICH with SAH and SDH  Reported BP was high on albumin and neo at arrival of neuro  ICU  Received FFP x 2  Repeat CT 07/26/15 6am showed stable ICH with SAH SDH without hydrocephalus, no in location of initial infarct  Repeat CT 07/27/2015 - R IPH similar, evolving L MCA infarct w/o HT, stable L occipital encephalomalacia, moderate SAH stable  Seizure   GTC x 2 due to ICH  Ativan  x 1  keppra  bid  EEG - no sz - consistent with her known history of recent stroke.  Acute Respiratory Failure  Secondary to stroke  Initially intubated for procedure  Remains on vent and unable to protect airway  CCM on board  May need Trach discussed with family   Hypertensive Emergency  BP up in the 220s in A-line with good waveform post IR upon arrival to the ICU  Initially treated with cardene, now off  BP goal < 180 due to ICH -   Hyperlipidemia  Home meds:  lipitor 20 mg daily  LDL 80, goal < 70  Resume statin once stable, based on plan of care  Continue statin at discharge depending on plan of care  Diabetes  HgbA1c 5.9 , goal < 7.0  Controlled  Other Stroke Risk Factors  Advanced age  Hx stroke/TIA  07/2001 - bilateral thalamic and midbrain infarct, no source found. TEE done at that time. 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   Acute blood Loss Anemia  Hgb 9.5->10.9->7.2->7.5  Likely secondary to IR and hydratioin  Febrile, temperature elevation  TM 102.3   CXR 07/28/2015 - increase L retrocardiac airspace dx w/ possible small L pleural effusion  Urine - 07/11/2015 - Protein 30 with trace hemoglobin - otherwise unremarkable  Treated with tylenol  WBC 6.9->10.2 -> 13.9 -> 11.8  Currently on no antibiotics  Close monitoring  Other Active Problems  AKI Cr 3 -> 2.33 (improving)  BIBY,SHARON  [facility] See Amion for Pager information August 06, 2015 8:18 AM  I have personally examined this patient, reviewed notes, independently viewed imaging studies, participated in medical decision making and plan of care. I have  made any additions or clarifications directly to the above note. Agree with note above.  I had a long discussion with the patient's husband, 2 sons, one daughter and daughter-in-law regarding her condition, poor prognosis and likelihood of making significant recovery and living independently being negligible. Family understands and all agreed that patient would not want to live in a nursing home with severe disability and suffering and they are leaning towards comfort care and withdrawal of ventilatory support which they are likely to do this evening. They were given opportunity to ask questions which were answered to their satisfaction This patient is critically ill and at significant risk of neurological worsening, death and care requires constant monitoring of vital signs, hemodynamics,respiratory and cardiac monitoring, extensive review of multiple databases, frequent neurological assessment, discussion with family, other specialists and medical decision making  of high complexity.I have made any additions or clarifications directly to the above note.This critical care time does not reflect procedure time, or teaching time or supervisory time of PA/NP/Med Resident etc but could involve care discussion time.  I spent 35 minutes of neurocritical care time  in the care of  this patient.     Delia Heady, MD Medical Director [facility] Pager: [phone] 07/30/2015 10:07 AM  To contact Stroke Continuity provider, please refer to [email or web address]. After hours, contact General Neurology

## 2015-08-06 NOTE — Progress Notes (Signed)
PULMONARY / CRITICAL CARE MEDICINE   Name: [name] MRN: [id number] DOB: February 16, 1945    ADMISSION DATE:  07/24/2015 CONSULTATION DATE:  07/15/2015  REFERRING MD:  IR  CHIEF COMPLAINT:  stroke  HISTORY OF PRESENT ILLNESS:   [name] is a 70 yo female who was found to be less responsive and weak on her right side by family today 2 hours prior to presentation to the ED. Patient was brought to the ED and was noted to have slurred speech and right sided hemineglect and weakness. A code stroke was called. CT performed and was concerning for left MCA ischemic stroke. TPA was subsequently given. Neurology decided to proceed with endovascular intervention with interventional radiology to retrieve the clot. Interventional radiology performed revascularization and reperfusion was achieved. She was placed on the vent during the procedure. Noted to have a large ICH post op.   PCCM was consulted for management of mechanical ventilation and medical issues.   SUBJECTIVE:  Remains unresponsive off sedation. Failed weaning again this AM.  VITAL SIGNS: BP (!) 148/74   Pulse (!) 106   Temp (!) 101.3 F (38.5 C) (Rectal)   Resp (!) 29   Ht 5\' 6"  (1.676 m)   Wt 56.8 kg (125 lb 3.5 oz)   SpO2 96%   BMI 20.21 kg/m   HEMODYNAMICS:    VENTILATOR SETTINGS: Vent Mode: PRVC FiO2 (%):  [40 %] 40 % Set Rate:  [18 bmp] 18 bmp Vt Set:  [500 mL] 500 mL PEEP:  [5 cmH20] 5 cmH20 Plateau Pressure:  [22 cmH20-26 cmH20] 22 cmH20  INTAKE / OUTPUT: I/O last 3 completed shifts: In: 5775 [I.V.:3375; Other:80; NG/GT:1800; IV Piggyback:520] Out: 3450 [Urine:3450]  PHYSICAL EXAMINATION: General:  Thin female. No distress Neuro:  Comatose, no response on exam, opens left eye but not tracking. HEENT:  L eye post surgical, R eye 71mm, Moist mucus membranes, No thyromegaly or JVD Cardiovascular:  RRR, No MRG Lungs: Clear, no wheeze or crackles Abdomen:  Soft, NT, ND and +BS. Musculoskeletal:  Decreased muscle  bulk, no edema, feet are cold Skin: No rashes  LABS:  BMET  Recent Labs Lab 07/26/15 0320 07/28/15 0330 07/30/15 0344  NA 138 138 138  K 5.0 5.1 4.3  CL 111 112* 113*  CO2 21* 18* 19*  BUN 32* 37* 36*  CREATININE 2.67* 2.56* 2.33*  GLUCOSE 134* 103* 129*   Electrolytes  Recent Labs Lab 07/26/15 0320 07/28/15 0330 07/30/15 0344  CALCIUM 8.7* 8.8* 8.5*  MG  --   --  1.5*  PHOS  --   --  2.3*   CBC  Recent Labs Lab 07/28/15 0936 07/28/15 1308 07/30/15 0344  WBC 21.0* 13.9* 11.8*  HGB 11.2* 8.1* 7.5*  HCT 34.5* 25.9* 23.3*  PLT 20* 221 241   Coag's  Recent Labs Lab 07/15/2015 1346  APTT 21*  INR 1.11   Sepsis Markers No results for input(s): LATICACIDVEN, PROCALCITON, O2SATVEN in the last 168 hours.  ABG  Recent Labs Lab 07/26/15 0410 07/27/15 0620 07/30/15 0428  PHART 7.440 7.371 7.410  PCO2ART 29.6* 32.3* 28.2*  PO2ART 171* 179* 189*   Liver Enzymes  Recent Labs Lab 07/09/2015 1346  AST 18  ALT 11*  ALKPHOS 89  BILITOT 0.3  ALBUMIN 3.5    Cardiac Enzymes No results for input(s): TROPONINI, PROBNP in the last 168 hours.  Glucose  Recent Labs Lab 07/30/15 1148 07/30/15 1521 07/30/15 1944 07/30/15 2311 2015-08-13 0326 2015-08-13 0743  GLUCAP 134* 82  119* 122* 132* 150*    STUDIES:  7/20 CXR: Interval repositioning of the endotracheal tube which is now in satisfactory radiographic position. Mild pulmonary vascular congestion. 7/20 CT Head:Early CT findings of a large diffuse left cerebral hemisphere MCA infarct involving the frontal, temporal and parietal lobes with sulcal effacement and loss of gray-white differentiation. Persistent hyper attenuation of the left MCA vasculature.No acute intracranial hemorrhage.  7/21 CT head: Large ICH with SAH extension  CULTURES:  ANTIBIOTICS: None  SIGNIFICANT EVENTS: 7/20: Admit to ICU  LINES/TUBES: ET tube 7/20 > Rt fem A line 7/20>  DISCUSSION: [name] is a 70 yo female who was  admitted for L MCA stroke, s/p tPA and revascularization Developed large ICH with seizures post procedure.   ASSESSMENT / PLAN:  PULMONARY A: Inability to protect airway in post operative setting P:   Terminal extubation today.  NEUROLOGIC A:   CVA on left MCA stroke causing aphasia and right hemianopia- s/p tPA and clot retrieval  Hx of Stroke w/ R sided deficits 2005 Large ICH Seizures P:   Morphine for comfort then withdraw ETT.  CARDIOVASCULAR A:  Hx of Chronic Diastolic CHF Hx of MI in 2014 Hx of CAD  P:  D/C tele.  RENAL A:   CKD (baseline Cr ~3)  P:   D/C lab draws.  GASTROINTESTINAL A:   Malnourished Hx of GERD P:   D/C TF.  HEMATOLOGIC A:   Mild Anemia Hx of DVT   P:  D/C SCD's.  INFECTIOUS A:   No acute concerns  P:   No abx.  ENDOCRINE A:   T2DM (not on any diabetic medications). Last A1C 6  P:   D/C CBGs.  FAMILY  - Updates:  Per bedside RN, family is ready for withdraw, will start morphine and terminally extubate when family is ready.  Withdraw orders signed and held.  PCCM will sign off.  - Inter-disciplinary family meet or Palliative Care meeting due by:  7/27  The patient is critically ill with multiple organ systems failure and requires high complexity decision making for assessment and support, frequent evaluation and titration of therapies, application of advanced monitoring technologies and extensive interpretation of multiple databases.   Critical Care Time devoted to patient care services described in this note is  35  Minutes. This time reflects time of care of this signee Dr Koren Bound. This critical care time does not reflect procedure time, or teaching time or supervisory time of PA/NP/Med student/Med Resident etc but could involve care discussion time.  Alyson Reedy, M.D. [facility]. Pager: [phone]. After hours pager: [phone].

## 2015-08-06 NOTE — Progress Notes (Signed)
Family at bedside. They stated that they were ready to withdraw care. Started a morphine drip and will let that run for 15-37minutes before extubation for comfort.

## 2015-08-06 DEATH — deceased

## 2015-08-10 LAB — CBC WITH DIFFERENTIAL/PLATELET
BASOS PCT: 0 %
Basophils Absolute: 0 10*3/uL (ref 0.0–0.1)
EOS ABS: 0 10*3/uL (ref 0.0–0.7)
Eosinophils Relative: 0 %
HCT: 34.5 % — ABNORMAL LOW (ref 36.0–46.0)
HEMOGLOBIN: 11.2 g/dL — AB (ref 12.0–15.0)
LYMPHS PCT: 4 %
Lymphs Abs: 0.8 10*3/uL (ref 0.7–4.0)
MCH: 30.4 pg (ref 26.0–34.0)
MCHC: 32.5 g/dL (ref 30.0–36.0)
MCV: 93.5 fL (ref 78.0–100.0)
Monocytes Absolute: 0.4 10*3/uL (ref 0.1–1.0)
Monocytes Relative: 2 %
NEUTROS PCT: 94 %
Neutro Abs: 19.8 10*3/uL — ABNORMAL HIGH (ref 1.7–7.7)
Platelets: 20 10*3/uL — CL (ref 150–400)
RBC: 3.69 MIL/uL — ABNORMAL LOW (ref 3.87–5.11)
RDW: 15.3 % (ref 11.5–15.5)
WBC MORPHOLOGY: INCREASED
WBC: 21 10*3/uL — ABNORMAL HIGH (ref 4.0–10.5)

## 2015-08-13 NOTE — Discharge Summary (Addendum)
Patient ID: [name] MRN: [id number] DOB/AGE: 03/18/1945 70 y.o.  Admit date: Jul 30, 2015 Death date:2015/08/05 21:39 hrs  Admission Diagnoses: Stroke  Cause of Death:  Large left middle cerebral artery infarct secondary to left terminal internal carotid artery occlusion treated with IV TPA followed by recanalization with mechanical embolectomy using the solitaire device. Hospital course complicated by patient vomiting and developing a seizure and subsequent brain scan showing left frontal intracerebral hemorrhage with subarachnoid hemorrhage and subdural hemorrhage. Patient made DO NOT RESUSCITATE and comfort care and ventilatory support withdrawn by family  Pertinent Medical Diagnosis: Active Problems:   Stroke (cerebrum) ([place])   Acute ischemic stroke (HCC)   Acute respiratory failure ([place])   Cerebrovascular accident (CVA) due to occlusion of left middle cerebral artery ([facility])   Coarctation of aorta, recurrent, post-intervention   Endotracheally intubated Chronic kidney disease- unable to determine stage   Hospital Course:[name] is an 70 y.o. 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 Today She was seen normal at 1200. Her daughter went out to get her a sausage biscuit and when she returned she tried to sit her mother up. When she did this her mother slumped to the right and could not keep upright. She was not talking clearly. This was very different from her baseline and EMS was called. On arrival CT showed a clear left dense  MCA sign and tPA was given.  . The patient was given IV TPA administration and subsequently taken for emergent mechanical embolectomy which was performed by Dr. Estanislado Pandy. Patient was subsequently admitted to the neurological intensive care unit for ventilatory support and critical care service were consulted. Patient do not show significant  neurological improvement and it was clear that patient wouldn't have required prolonged ventilatory support and tracheostomy and feeding tube and likely represent a nursing home for the rest of her life. Patient's husband understood and wishes and felt she would not have wanted to be kept alive in these circumstances. I met several times with the patient's husband and family members and they finally agreed to make patient DO NOT RESUSCITATE and comfort care. Patient was terminally extubated on 08/05/2015 and kept comfortable with morphine. Patient's condition gradually declined and she was found to be apneic and pulseless and pronounced than by the RN on 2015-08-05 at 2139 hrs.  Signed: SETHI,PRAMOD 08/13/2015, 2:48 PM

## 2018-02-28 IMAGING — CT CT HEAD W/O CM
4 series · 15 of 47 positions shown, 17 images · non-contrast
Comparison: MRI head and CT HEAD July 26, 2015 and priors

CLINICAL DATA: Neurological changes, status post revascularization
and hemorrhage. History of acute LEFT MCA territory infarct.

EXAM:
CT HEAD WITHOUT CONTRAST
TECHNIQUE: Contiguous axial images were obtained from the base of the skull
through the vertex without intravenous contrast.

[Series 2: head without · axial · non-contrast · 0.43mm/px · z∈[-145,-30]mm · 7 of 31 slices shown, 9 images]
[im 4/31  brain]
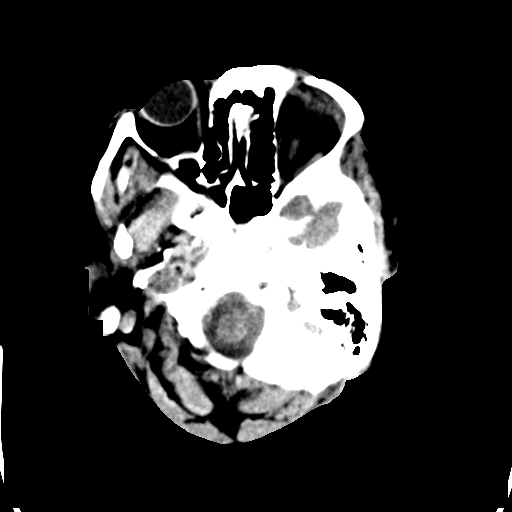
[im 4/31  bone]
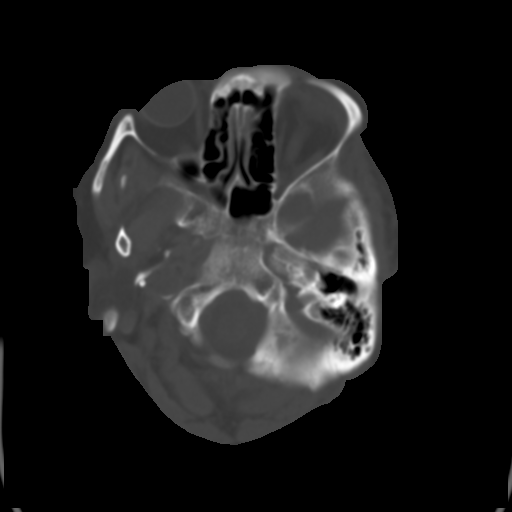
[im 8/31  brain]
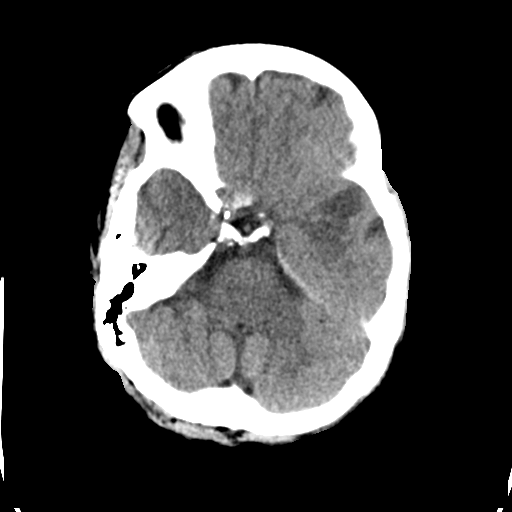
[im 12/31  brain]
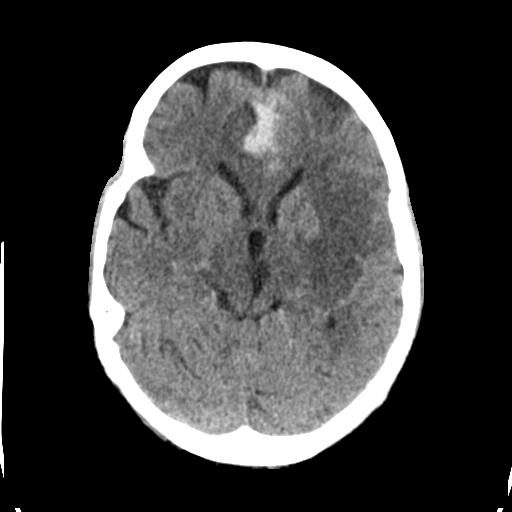
[im 16/31  brain]
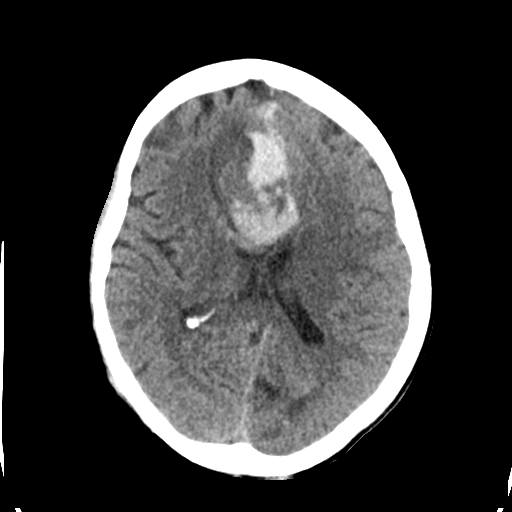
[im 19/31  brain]
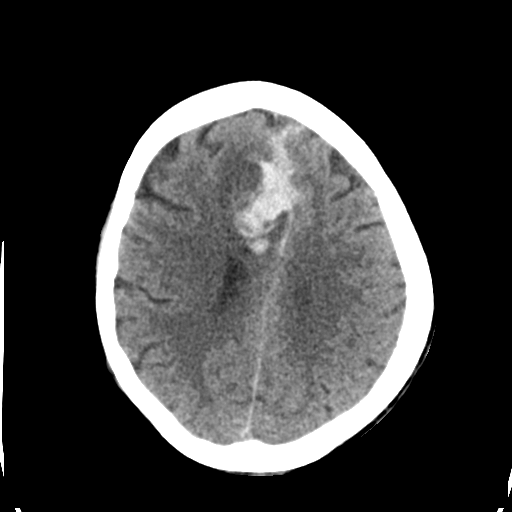
[im 19/31  bone]
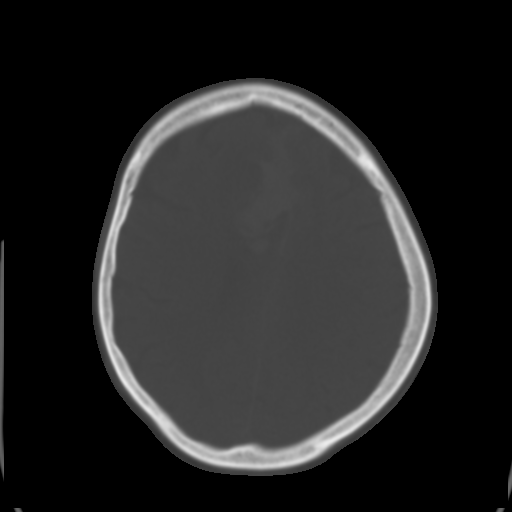
[im 23/31  brain]
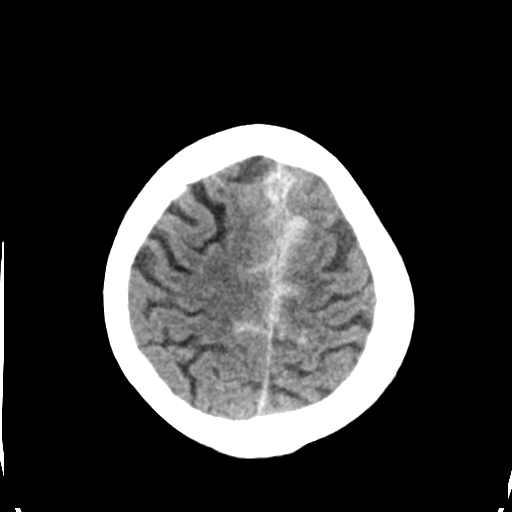
[im 27/31  brain]
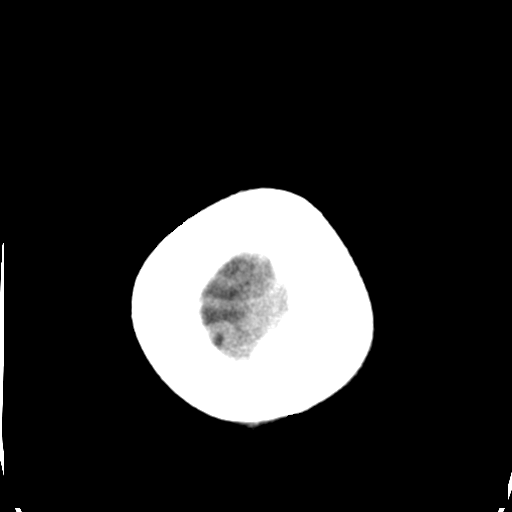

[Series 3: head bone · axial · 0.43mm/px · z∈[-146,-130]mm · 2 of 77 slices shown]
[im 8/77  bone]
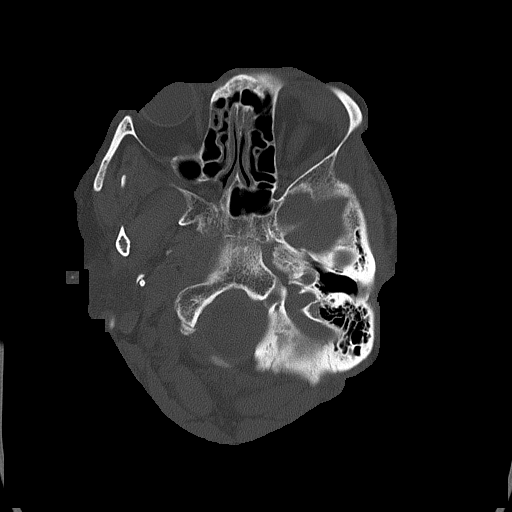
[im 16/77  bone]
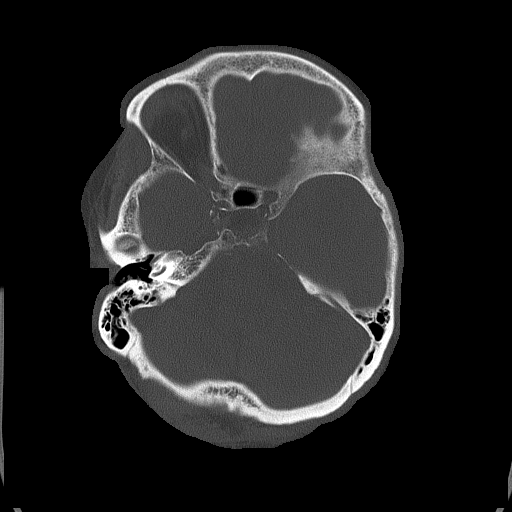

[Series 4: head without cor · coronal · non-contrast · 0.30mm/px · 3 of 66 slices shown]
[im 22/66  brain]
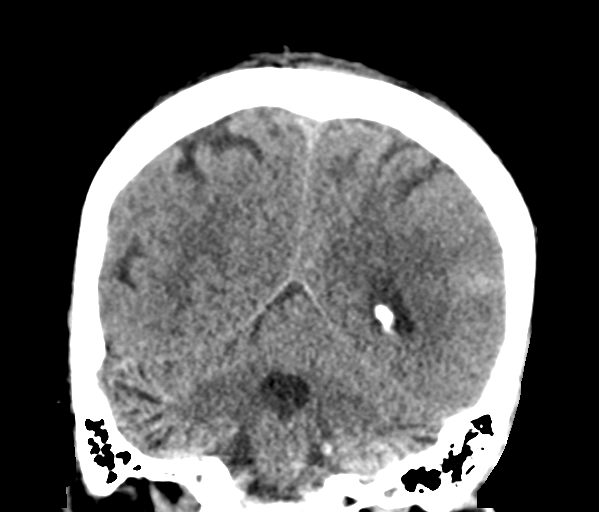
[im 29/66  brain]
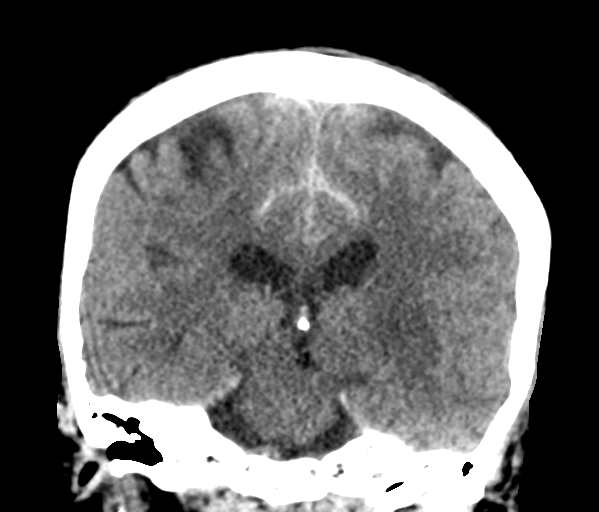
[im 37/66  brain]
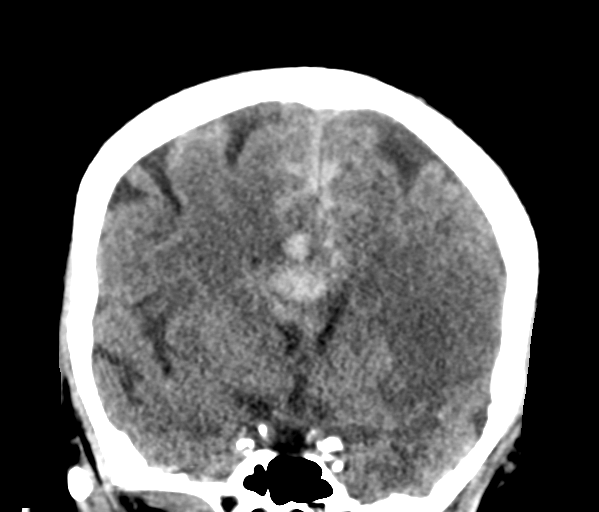

[Series 5: head without sag · sagittal · non-contrast · 0.29mm/px · 3 of 58 slices shown]
[im 20/58  brain]
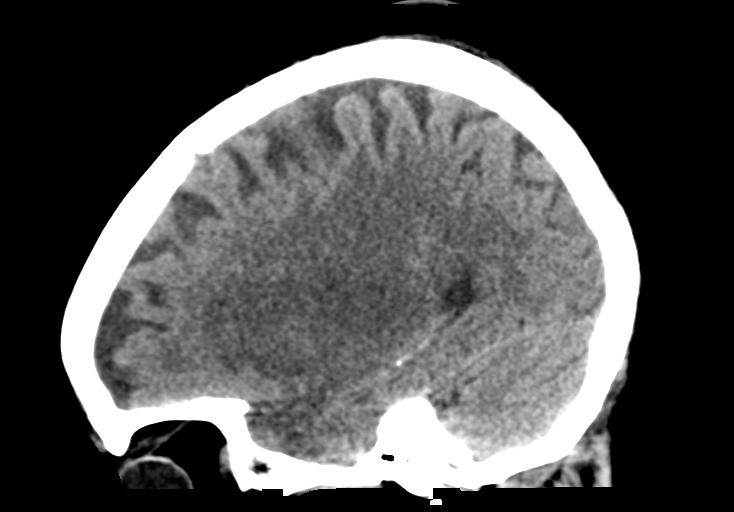
[im 29/58  brain]
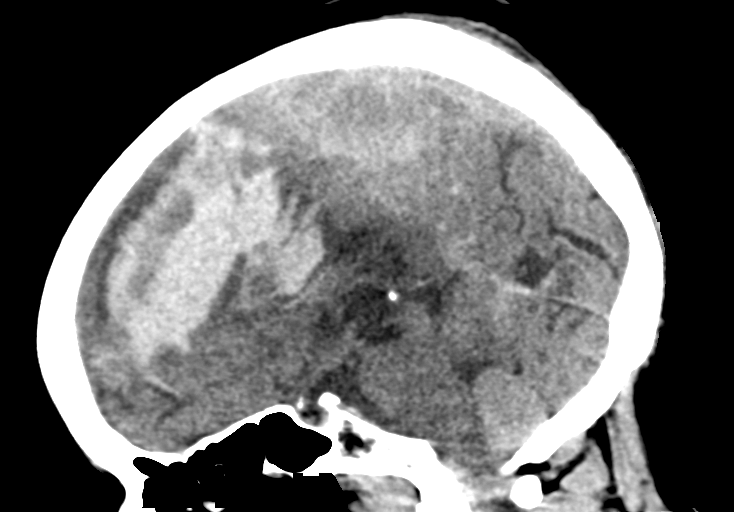
[im 39/58  brain]
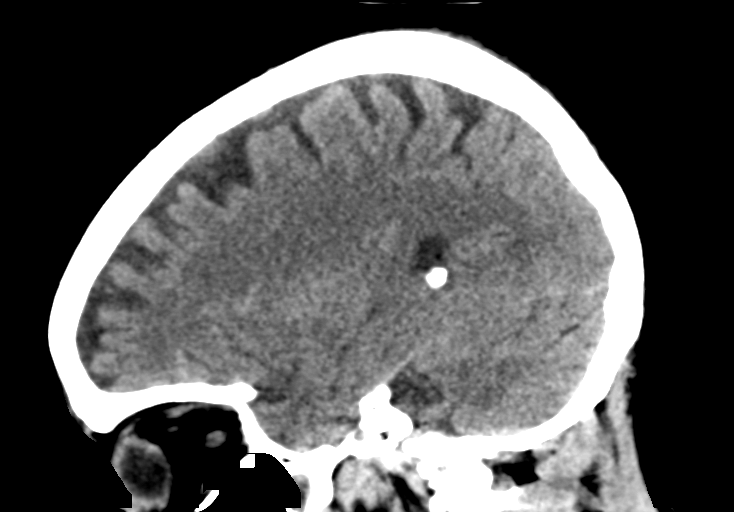

[15 of 47 positions shown; findings below may reference images not displayed]

FINDINGS: HEAD: Similar appearance of 28 x 47 mm RIGHT parafalcine/frontal
lobe intraparenchymal hematoma spleen the frontal horns of the
lateral ventricles. No hydrocephalus. Small amount of blood products
and cytotoxic edema LEFT occipital lobe. Moderate amount of
subarachnoid hemorrhage at the frontal convexities similar to prior
CT, to lesser extent within the sylvian fissures in posterior fossa.
Loss of LEFT frontotemporal and LEFT basal ganglia gray-white matter
differentiation involving the insula. Old bilateral thalamus
infarcts. Basal cisterns are patent.

ORBITS: The included ocular globes and orbital contents are
non-suspicious. Status post bilateral ocular lens implants.

SINUSES: Trace paranasal sinus mucosal thickening without air-fluid
levels. Mastoid air cells are well aerated.

SKULL/SOFT TISSUES: No skull fracture. No significant soft tissue
swelling.
IMPRESSION: 1. Similar appearance of large RIGHT frontal intraparenchymal
hematoma with similar mass effect.
2. Evolving large LEFT MCA territory infarct without hemorrhagic
conversion.
3. Stable LEFT occipital lobe encephalomalacia with hemorrhagic
conversion.
4. Similar moderate subarachnoid hemorrhage.

## 2018-03-01 IMAGING — CR DG CHEST 1V PORT
1 series · 1 of 1 positions shown · non-contrast
Comparison: 07/25/2015

CLINICAL DATA: Fever.

EXAM:
PORTABLE CHEST 1 VIEW

[AP]
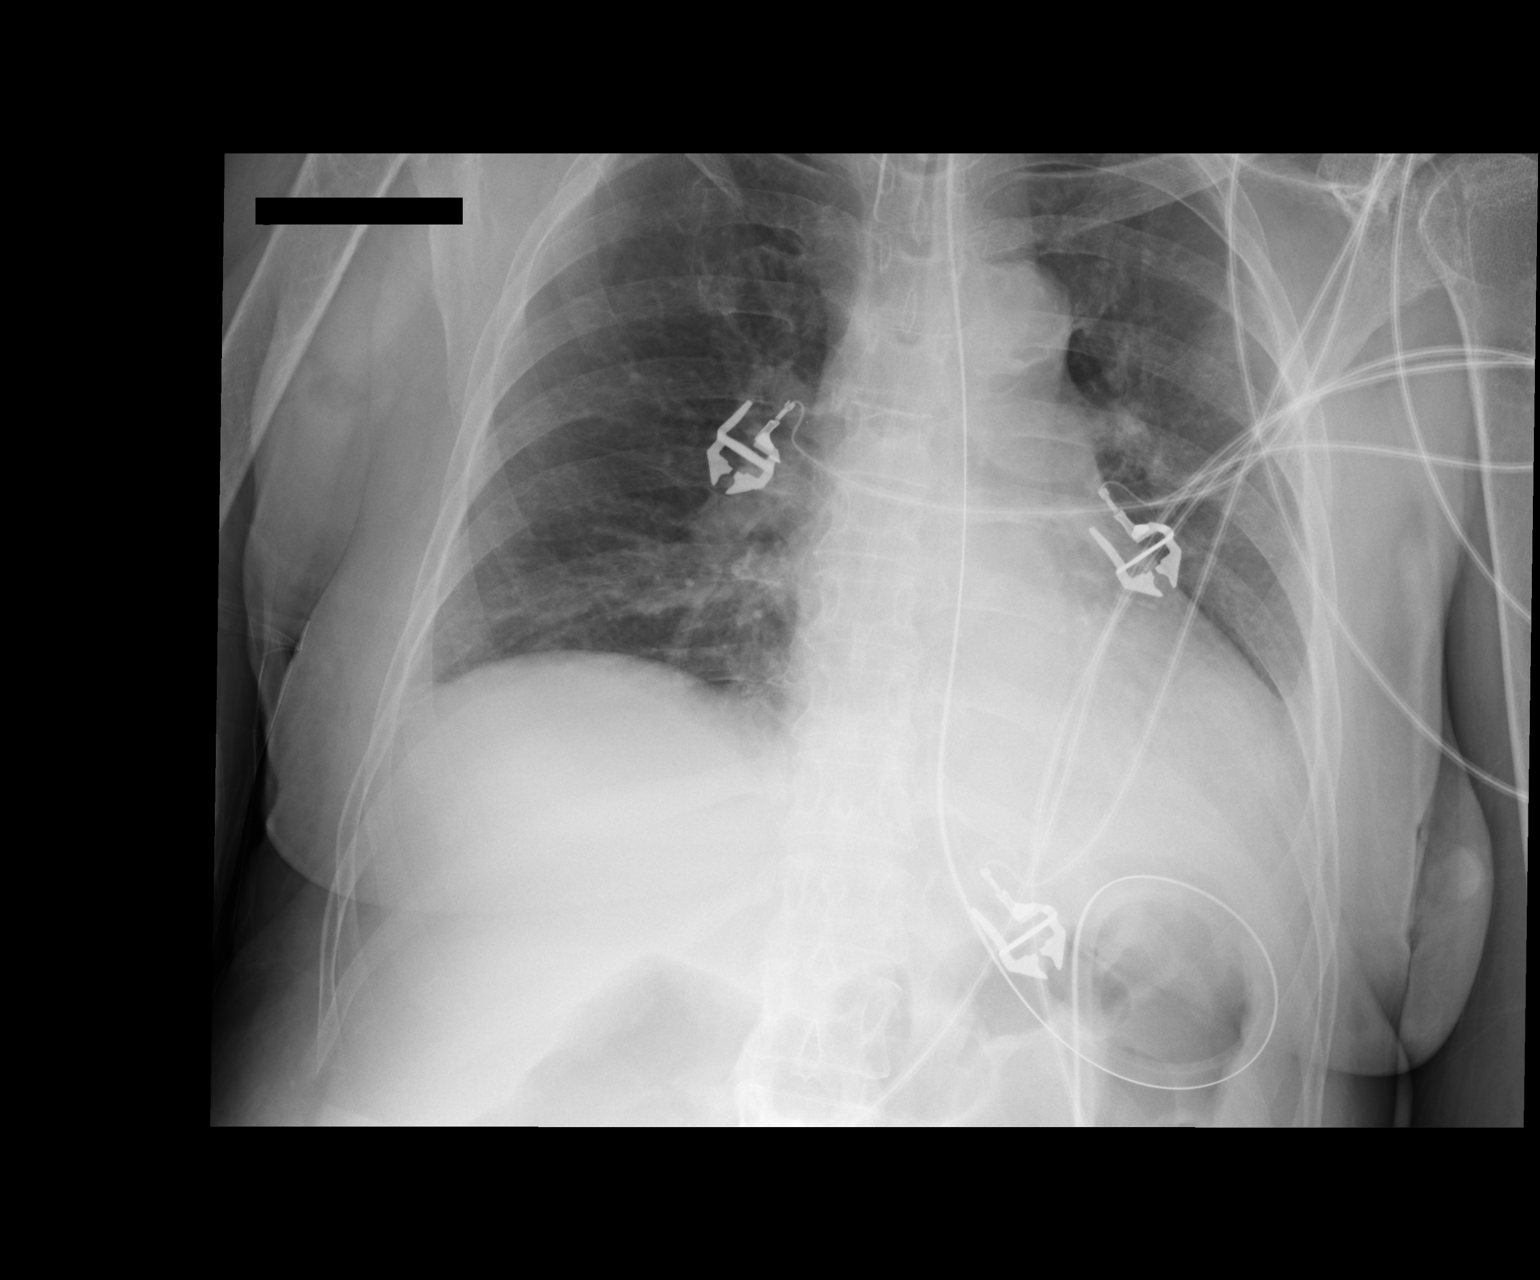

[1 of 1 positions shown; findings below may reference images not displayed]

FINDINGS: Endotracheal tube terminates 4.9 cm above carina.Nasogastric extends
beyond the inferior aspect of the film. Cardiomegaly accentuated by
AP portable technique. Possible small left pleural effusion.
Atherosclerosis in the transverse aorta. Retrocardiac left lower
lobe airspace disease is slightly increased.
IMPRESSION: Increase in left retrocardiac airspace disease with possible small
left pleural effusion. This could represent atelectasis or
developing infection, given history of fever.

Aortic atherosclerosis.
